# Patient Record
Sex: Female | Born: 1950 | Hispanic: Yes | Marital: Married | Smoking: Never smoker
Health system: Southern US, Community
[De-identification: ages and names within clinical notes are randomized; demographics above are authoritative.]

## PROBLEM LIST (undated history)

## (undated) DIAGNOSIS — D124 Benign neoplasm of descending colon: Secondary | ICD-10-CM

## (undated) DIAGNOSIS — R7303 Prediabetes: Secondary | ICD-10-CM

## (undated) DIAGNOSIS — K219 Gastro-esophageal reflux disease without esophagitis: Secondary | ICD-10-CM

## (undated) DIAGNOSIS — I10 Essential (primary) hypertension: Secondary | ICD-10-CM

## (undated) DIAGNOSIS — J302 Other seasonal allergic rhinitis: Secondary | ICD-10-CM

## (undated) DIAGNOSIS — L409 Psoriasis, unspecified: Secondary | ICD-10-CM

## (undated) DIAGNOSIS — E119 Type 2 diabetes mellitus without complications: Secondary | ICD-10-CM

## (undated) DIAGNOSIS — E785 Hyperlipidemia, unspecified: Secondary | ICD-10-CM

## (undated) DIAGNOSIS — H269 Unspecified cataract: Secondary | ICD-10-CM

## (undated) DIAGNOSIS — R195 Other fecal abnormalities: Secondary | ICD-10-CM

## (undated) DIAGNOSIS — Z8619 Personal history of other infectious and parasitic diseases: Secondary | ICD-10-CM

## (undated) DIAGNOSIS — Z8679 Personal history of other diseases of the circulatory system: Secondary | ICD-10-CM

## (undated) HISTORY — DX: Hyperlipidemia, unspecified: E78.5

## (undated) HISTORY — DX: Other seasonal allergic rhinitis: J30.2

## (undated) HISTORY — DX: Personal history of other infectious and parasitic diseases: Z86.19

## (undated) HISTORY — DX: Benign neoplasm of descending colon: D12.4

## (undated) HISTORY — DX: Essential (primary) hypertension: I10

## (undated) HISTORY — DX: Unspecified cataract: H26.9

## (undated) HISTORY — DX: Type 2 diabetes mellitus without complications: E11.9

## (undated) HISTORY — DX: Other fecal abnormalities: R19.5

## (undated) HISTORY — PX: CORNEA LACERATION REPAIR: SHX355

## (undated) HISTORY — PX: VALVE REPLACEMENT: SUR13

---

## 1979-08-05 DIAGNOSIS — Z5189 Encounter for other specified aftercare: Secondary | ICD-10-CM | POA: Insufficient documentation

## 1979-08-05 HISTORY — DX: Encounter for other specified aftercare: Z51.89

## 2015-01-08 DIAGNOSIS — Z952 Presence of prosthetic heart valve: Secondary | ICD-10-CM

## 2015-01-08 DIAGNOSIS — L409 Psoriasis, unspecified: Secondary | ICD-10-CM | POA: Insufficient documentation

## 2015-01-08 HISTORY — DX: Presence of prosthetic heart valve: Z95.2

## 2016-08-04 HISTORY — PX: KNEE ARTHROSCOPY W/ MENISCAL REPAIR: SHX1877

## 2017-10-28 ENCOUNTER — Encounter: Payer: Self-pay | Admitting: Gastroenterology

## 2017-11-25 HISTORY — PX: BREAST BIOPSY: SHX20

## 2017-12-04 ENCOUNTER — Ambulatory Visit: Payer: Self-pay | Admitting: Gastroenterology

## 2018-01-05 ENCOUNTER — Ambulatory Visit (INDEPENDENT_AMBULATORY_CARE_PROVIDER_SITE_OTHER): Payer: Medicaid Other | Admitting: Gastroenterology

## 2018-01-05 ENCOUNTER — Encounter: Payer: Self-pay | Admitting: Gastroenterology

## 2018-01-05 VITALS — BP 126/72 | HR 76 | Ht 59.0 in | Wt 141.5 lb

## 2018-01-05 DIAGNOSIS — R1013 Epigastric pain: Secondary | ICD-10-CM

## 2018-01-05 DIAGNOSIS — K219 Gastro-esophageal reflux disease without esophagitis: Secondary | ICD-10-CM

## 2018-01-05 DIAGNOSIS — Z800 Family history of malignant neoplasm of digestive organs: Secondary | ICD-10-CM

## 2018-01-05 MED ORDER — OMEPRAZOLE 20 MG PO CPDR: 20.0000 mg | DELAYED_RELEASE_CAPSULE | Freq: Every day | ORAL | 11 refills | Status: DC

## 2018-01-05 NOTE — Progress Notes (Signed)
Chief Complaint: For colorectal cancer screening and reflux  Referring Provider: Dr. Sheral Apley      ASSESSMENT AND PLAN;   #1. GERD with epigastric pain. Omeprazole 20mg  po qd #30. Zantac 150mg  po qd. Call in 2 weeks Pt's daughter to check PT in 2 weeks since omeprazole can affect Coumadin level. If still with problems in 2 weeks, proceed with ultrasound of the abdomen.  Patient and patient's daughter will call us.  #2.  FH colon cancer  (dad at age 5)- but pt on coumadin for mech MVR.  Discussed 3 options (1.  Watchful waiting 2.  Cologuard  3.  Colonoscopy -discussed risks and benefits for each.  She has opted for Cologuard) -cologuard test -If positive, then she would need colonoscopy.  Patient and patient's family do understand.  We would need cardiology's assistance regarding Coumadin/Lovenox bridging/heparin bridging if colonoscopy is needed.   HPI:    Charmine Bockrath is a 67 y.o. female  With occasional heartburn and intermittent epigastric discomfort made worse with spicy food.   Has more epigastric discomfort rather than pain, gnawing with occasional burning, nonradiating, no nocturnal symptoms. No nausea or vomiting No lower abdominal pain. No nausea, vomiting,  regurgitation, odynophagia or dysphagia.  No significant diarrhea or constipation.  There is no melena or hematochezia. No unintentional weight loss. 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  She was evaluated in [place] and was told to hold off on colonoscopy as she could not be taken off Coumadin.    Past Medical History:  Diagnosis Date  . Hyperlipidemia   . Hypertension   . Type II diabetes mellitus ([place])     Past Surgical History:  Procedure Laterality Date  . VALVE REPLACEMENT      Family History  Problem Relation Age of Onset  . Colon cancer Father     Social History   Tobacco Use  . Smoking status: Never Smoker  . Smokeless tobacco: Never Used    Substance Use Topics  . Alcohol use: Not Currently  . Drug use: Never    Current Outpatient Medications  Medication Sig Dispense Refill  . atorvastatin (LIPITOR) 10 MG tablet Take 10 mg by mouth daily.    . captopril (CAPOTEN) 25 MG tablet Take 25 mg by mouth 2 (two) times daily.    . citalopram (CELEXA) 10 MG tablet Take 10 mg by mouth daily.    Marland Kitchen loratadine (CLARITIN) 10 MG tablet Take 10 mg by mouth daily.    . ranitidine (ZANTAC) 150 MG tablet Take 150 mg by mouth 3 times/day as needed-between meals & bedtime for heartburn.    . spironolactone (ALDACTONE) 25 MG tablet Take 25 mg by mouth daily.    . traMADol (ULTRAM) 50 MG tablet Take by mouth 3 (three) times daily as needed.    . warfarin (COUMADIN) 3 MG tablet Take 3 mg by mouth every other day.    . warfarin (COUMADIN) 4 MG tablet Take 4 mg by mouth every other day.     No current facility-administered medications for this visit.     Not on File  Review of Systems:  Constitutional: Denies fever, chills, diaphoresis, appetite change and fatigue.  HEENT: Denies photophobia, eye pain, redness, hearing loss, ear pain, congestion, sore throat, rhinorrhea, sneezing, mouth sores, neck pain, neck stiffness and tinnitus.   Respiratory: Denies SOB, DOE, cough, chest tightness,  and wheezing.   Cardiovascular: Denies chest pain, palpitations and leg swelling.  Genitourinary: Denies dysuria, urgency, frequency, hematuria, flank pain and difficulty urinating.  Musculoskeletal: Denies myalgias, back pain, joint swelling, arthralgias and gait problem.  Skin: No rash.  Neurological: Denies dizziness, seizures, syncope, weakness, light-headedness, numbness and headaches.  Hematological: Denies adenopathy. Easy bruising, personal or family bleeding history  Psychiatric/Behavioral: No anxiety or depression     Physical Exam:    BP 126/72   Pulse 76   Ht 4\' 11"  (1.499 m)   Wt 141 lb 8 oz (64.2 kg)   BMI 28.58 kg/m  Filed Weights    01/05/18 1137  Weight: 141 lb 8 oz (64.2 kg)   Constitutional:  Well-developed, in no acute distress. Psychiatric: Normal mood and affect. Behavior is normal. HEENT: Pupils normal.  Conjunctivae are normal. No scleral icterus. Neck supple.  Cardiovascular: Normal rate, regular rhythm. No edema Pulmonary/chest: Effort normal and breath sounds normal. No wheezing, rales or rhonchi. Abdominal: Soft, nondistended. Nontender. Bowel sounds active throughout. There are no masses palpable. No hepatomegaly. Rectal:  defered Neurological: Alert and oriented to person place and time. Skin: Skin is warm and dry. No rashes noted.   Carmell Austria, MD 01/05/2018, 11:54 AM  Cc: Dr. Sheral Apley

## 2018-01-05 NOTE — Patient Instructions (Signed)
If you are age 69 or older, your body mass index should be between 23-30. Your Body mass index is 28.58 kg/m. If this is out of the aforementioned range listed, please consider follow up with your Primary Care Provider.  If you are age 57 or younger, your body mass index should be between 19-25. Your Body mass index is 28.58 kg/m. If this is out of the aformentioned range listed, please consider follow up with your Primary Care Provider.   We have sent the following medications to your pharmacy for you to pick up at your convenience: Omeprazole 20 mg once daily.  Please purchase the following medications over the counter and take as directed: Zantac 150 mg take one tablet by mouth at bedtime.    Your provider has ordered Cologuard testing as an option for colon cancer screening. This is performed by [employer] [facility] and may be out of network with your insurance. PRIOR to completing the test, it is YOUR responsibility to contact your insurance about covered benefits for this test. Your out of pocket expense could be anywhere from $0.00 to $649.00.   When you call to check coverage with your insurer, please provide the following information:   -The ONLY provider of Cologuard is [place] code for Cologuard is [phone].  [employer] NPI # [id number]  -[employer] Tax ID # [id number]   We have already sent your demographic and insurance information to [facility] (phone number [phone]) and they should contact you within the next week regarding your test. If you have not heard from them within the next week, please call our office at [phone].     Thank you,  Dr. Jackquline Denmark

## 2018-02-08 ENCOUNTER — Encounter: Payer: Self-pay | Admitting: Cardiology

## 2018-02-08 ENCOUNTER — Encounter: Payer: Self-pay | Admitting: *Deleted

## 2018-02-08 ENCOUNTER — Ambulatory Visit: Payer: Medicaid Other | Admitting: Cardiology

## 2018-02-08 DIAGNOSIS — Z01818 Encounter for other preprocedural examination: Secondary | ICD-10-CM | POA: Diagnosis not present

## 2018-02-08 DIAGNOSIS — E785 Hyperlipidemia, unspecified: Secondary | ICD-10-CM | POA: Diagnosis not present

## 2018-02-08 DIAGNOSIS — R7303 Prediabetes: Secondary | ICD-10-CM | POA: Insufficient documentation

## 2018-02-08 DIAGNOSIS — I10 Essential (primary) hypertension: Secondary | ICD-10-CM | POA: Diagnosis not present

## 2018-02-08 DIAGNOSIS — Z952 Presence of prosthetic heart valve: Secondary | ICD-10-CM | POA: Diagnosis not present

## 2018-02-08 HISTORY — DX: Prediabetes: R73.03

## 2018-02-08 HISTORY — DX: Encounter for other preprocedural examination: Z01.818

## 2018-02-08 HISTORY — DX: Presence of prosthetic heart valve: Z95.2

## 2018-02-08 HISTORY — DX: Essential (primary) hypertension: I10

## 2018-02-08 HISTORY — DX: Hyperlipidemia, unspecified: E78.5

## 2018-02-08 NOTE — Progress Notes (Signed)
Cardiology Consultation:    Date:  02/08/2018   ID:  Maria Ingram, DOB 24-Jun-1951, MRN [id number]  PCP:  Maryella Shivers, MD  Cardiologist:  Jenne Campus, MD   Referring MD: Maryella Shivers, MD   Chief Complaint  Patient presents with  . Pre-op Exam  I need a right knee replacement surgery  History of Present Illness:    Maria Ingram is a 67 y.o. female who is being seen today for the evaluation of cardiac issue before knee replacement surgery at the request of Maryella Shivers, MD.  Many years ago she had mitral valve replaced.  It was done in [place] however a few days later she required another surgery for the same problem.  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  I seen her last time in 2016.  She is been doing well and overall seems to be doing well but now started having a lot of pain in her right knee for last 2 years and required knee replacement surgery she is here in my office to be evaluated before the surgery.  She denies having any problems from heart point review there is no chest pain tightness squeezing pressure burning chest.  No swelling of lower extremities no shortness of breath but her ability to exercise is limited because of chronic right knee problem.  While at the time of her open heart surgery did not require bypass surgery as the valve replacement and according to the record to the latest valve that she had [employer] prosthesis.  I do not know the size of the valve  Past Medical History:  Diagnosis Date  . Hyperlipidemia   . Hypertension   . Type II diabetes mellitus ([place])     Past Surgical History:  Procedure Laterality Date  . VALVE REPLACEMENT      Current Medications: Current Meds  Medication Sig  . atorvastatin (LIPITOR) 10 MG tablet Take 10 mg by mouth daily.  . captopril (CAPOTEN) 25 MG tablet Take 25 mg by mouth 2 (two) times daily.  . citalopram (CELEXA) 10 MG tablet Take  10 mg by mouth daily.  Marland Kitchen loratadine (CLARITIN) 10 MG tablet Take 10 mg by mouth daily.  Marland Kitchen omeprazole (PRILOSEC) 20 MG capsule Take 1 capsule (20 mg total) by mouth daily.  . ranitidine (ZANTAC) 150 MG tablet Take 150 mg by mouth 3 times/day as needed-between meals & bedtime for heartburn.  . spironolactone (ALDACTONE) 25 MG tablet Take 25 mg by mouth daily.  . traMADol (ULTRAM) 50 MG tablet Take by mouth 3 (three) times daily as needed.  . warfarin (COUMADIN) 3 MG tablet Take 3 mg by mouth every other day.     Allergies:   Patient has no known allergies.   Social History   Socioeconomic History  . Marital status: Married    Spouse name: Not on file  . Number of children: Not on file  . Years of education: Not on file  . Highest education level: Not on file  Occupational History  . Not on file  Social Needs  . Financial resource strain: Not on file  . Food insecurity:    Worry: Not on file    Inability: Not on file  . Transportation needs:    Medical: Not on file    Non-medical: Not on file  Tobacco Use  . Smoking status: Never Smoker  . Smokeless tobacco: Never Used  Substance and Sexual Activity  . Alcohol use: Not  Currently  . Drug use: Never  . Sexual activity: Not on file  Lifestyle  . Physical activity:    Days per week: Not on file    Minutes per session: Not on file  . Stress: Not on file  Relationships  . Social connections:    Talks on phone: Not on file    Gets together: Not on file    Attends religious service: Not on file    Active member of club or organization: Not on file    Attends meetings of clubs or organizations: Not on file    Relationship status: Not on file  Other Topics Concern  . Not on file  Social History Narrative  . Not on file     Family History: The patient's family history includes Colon cancer in her father. ROS:   Please see the history of present illness.    All 14 point review of systems negative except as described per  history of present illness.  EKGs/Labs/Other Studies Reviewed:    The following studies were reviewed today:   EKG:  EKG is  ordered today.  The ekg ordered today demonstrates normal sinus rhythm normal PR interval normal QS complex duration morphology with nonspecific ST-T segment changes  Recent Labs: No results found for requested labs within last 8760 hours.  Recent Lipid Panel No results found for: CHOL, TRIG, HDL, CHOLHDL, VLDL, LDLCALC, LDLDIRECT  Physical Exam:    VS:  BP 134/68   Pulse 90   Ht 4\' 11"  (1.499 m)   Wt 142 lb (64.4 kg)   SpO2 96%   BMI 28.68 kg/m     Wt Readings from Last 3 Encounters:  02/08/18 142 lb (64.4 kg)  01/05/18 141 lb 8 oz (64.2 kg)     GEN:  Well nourished, well developed in no acute distress HEENT: Normal NECK: No JVD; No carotid bruits LYMPHATICS: No lymphadenopathy CARDIAC: RRR, no murmurs, no rubs, no gallops  RESPIRATORY:  Clear to auscultation without rales, wheezing or rhonchi  ABDOMEN: Soft, non-tender, non-distended MUSCULOSKELETAL:  No edema; No deformity  SKIN: Warm and dry NEUROLOGIC:  Alert and oriented x 3 PSYCHIATRIC:  Normal affect   ASSESSMENT:    1. History of mitral valve prosthesis   2. Essential hypertension   3. Dyslipidemia   4. Borderline diabetes    PLAN:    In order of problems listed above:  1. History of mitral valve prosthesis apparently to [facility] prosthesis.  I will ask you to have echocardiogram to check on the valve. 2. Essential hypertension blood pressure well controlled we will continue present management. 3. Dyslipidemia she is on statin and I will continue with that we will call primary care physician to get her fasting lipid profile. 4. Borderline diabetes: Followed by internal medicine team.  Hopefully after knee will be fixed to be able to exercise better which helps with the management of this problem. 5. Preop evaluation for right knee replacement surgery.  Valve will be assessed by  doing echocardiogram however because of multiple risk factors for coronary artery disease she also required evaluation for potential problem she will be scheduled to have [place]. 6. She does take Coumadin.  Coumadin need to be withdrawn 5 days before surgery and then if her INR drops to below 2 will start Lovenox.   Medication Adjustments/Labs and Tests Ordered: Current medicines are reviewed at length with the patient today.  Concerns regarding medicines are outlined above.  No orders of the  defined types were placed in this encounter.  No orders of the defined types were placed in this encounter.   Signed, Park Liter, MD, [facility] 02/08/2018 11:47 AM    [place]

## 2018-02-08 NOTE — Patient Instructions (Addendum)
Medication Instructions:  Your physician recommends that you continue on your current medications as directed. Please refer to the Current Medication list given to you today.  **You will be notified about test results and further instructions regarding coumadin.   Labwork: Your physician recommends that you return for lab work 5 days before scheduled procedure: PT/INR. You can go to our [place] office to have this done. The address is [address] We are opened Monday-Friday 8-5.   Testing/Procedures: You had an EKG today.   Your physician has requested that you have an echocardiogram. Echocardiography is a painless test that uses sound waves to create images of your heart. It provides your doctor with information about the size and shape of your heart and how well your heart's chambers and valves are working. This procedure takes approximately one hour. There are no restrictions for this procedure.  Your physician has requested that you have a lexiscan myoview. For further information please visit [email or web address]. Please follow instruction sheet, as given.  Follow-Up: Your physician recommends that you schedule a follow-up appointment in: 2 months.  If you need a refill on your cardiac medications before your next appointment, please call your pharmacy.   Thank you for choosing [facility]! Robyne Peers, RN [phone]   Echocardiogram An echocardiogram, or echocardiography, uses sound waves (ultrasound) to produce an image of your heart. The echocardiogram is simple, painless, obtained within a short period of time, and offers valuable information to your health care provider. The images from an echocardiogram can provide information such as:  Evidence of coronary artery disease (CAD).  Heart size.  Heart muscle function.  Heart valve function.  Aneurysm detection.  Evidence of a past heart attack.  Fluid buildup around the heart.  Heart muscle  thickening.  Assess heart valve function.  Tell a health care provider about:  Any allergies you have.  All medicines you are taking, including vitamins, herbs, eye drops, creams, and over-the-counter medicines.  Any problems you or family members have had with anesthetic medicines.  Any blood disorders you have.  Any surgeries you have had.  Any medical conditions you have.  Whether you are pregnant or may be pregnant. What happens before the procedure? No special preparation is needed. Eat and drink normally. What happens during the procedure?  In order to produce an image of your heart, gel will be applied to your chest and a wand-like tool (transducer) will be moved over your chest. The gel will help transmit the sound waves from the transducer. The sound waves will harmlessly bounce off your heart to allow the heart images to be captured in real-time motion. These images will then be recorded.  You may need an IV to receive a medicine that improves the quality of the pictures. What happens after the procedure? You may return to your normal schedule including diet, activities, and medicines, unless your health care provider tells you otherwise. This information is not intended to replace advice given to you by your health care provider. Make sure you discuss any questions you have with your health care provider. Document Released: 07/18/2000 Document Revised: 03/08/2016 Document Reviewed: 03/28/2013 Elsevier Interactive Patient Education  2017 [employer].    Cardiac Nuclear Scan A cardiac nuclear scan is a test that measures blood flow to the heart when a person is resting and when he or she is exercising. The test looks for problems such as:  Not enough blood reaching a portion of the heart.  The  heart muscle not working normally.  You may need this test if:  You have heart disease.  You have had abnormal lab results.  You have had heart surgery or  angioplasty.  You have chest pain.  You have shortness of breath.  In this test, a radioactive dye (tracer) is injected into your bloodstream. After the tracer has traveled to your heart, an imaging device is used to measure how much of the tracer is absorbed by or distributed to various areas of your heart. This procedure is usually done at a hospital and takes 2-4 hours. Tell a health care provider about:  Any allergies you have.  All medicines you are taking, including vitamins, herbs, eye drops, creams, and over-the-counter medicines.  Any problems you or family members have had with the use of anesthetic medicines.  Any blood disorders you have.  Any surgeries you have had.  Any medical conditions you have.  Whether you are pregnant or may be pregnant. What are the risks? Generally, this is a safe procedure. However, problems may occur, including:  Serious chest pain and heart attack. This is only a risk if the stress portion of the test is done.  Rapid heartbeat.  Sensation of warmth in your chest. This usually passes quickly.  What happens before the procedure?  Ask your health care provider about changing or stopping your regular medicines. This is especially important if you are taking diabetes medicines or blood thinners.  Remove your jewelry on the day of the procedure. What happens during the procedure?  An IV tube will be inserted into one of your veins.  Your health care provider will inject a small amount of radioactive tracer through the tube.  You will wait for 20-40 minutes while the tracer travels through your bloodstream.  Your heart activity will be monitored with an electrocardiogram (ECG).  You will lie down on an exam table.  Images of your heart will be taken for about 15-20 minutes.  You may be asked to exercise on a treadmill or stationary bike. While you exercise, your heart's activity will be monitored with an ECG, and your blood pressure  will be checked. If you are unable to exercise, you may be given a medicine to increase blood flow to parts of your heart.  When blood flow to your heart has peaked, a tracer will again be injected through the IV tube.  After 20-40 minutes, you will get back on the exam table and have more images taken of your heart.  When the procedure is over, your IV tube will be removed. The procedure may vary among health care providers and hospitals. Depending on the type of tracer used, scans may need to be repeated 3-4 hours later. What happens after the procedure?  Unless your health care provider tells you otherwise, you may return to your normal schedule, including diet, activities, and medicines.  Unless your health care provider tells you otherwise, you may increase your fluid intake. This will help flush the contrast dye from your body. Drink enough fluid to keep your urine clear or pale yellow.  It is up to you to get your test results. Ask your health care provider, or the department that is doing the test, when your results will be ready. Summary  A cardiac nuclear scan measures the blood flow to the heart when a person is resting and when he or she is exercising.  You may need this test if you are at risk for heart  disease.  Tell your health care provider if you are pregnant.  Unless your health care provider tells you otherwise, increase your fluid intake. This will help flush the contrast dye from your body. Drink enough fluid to keep your urine clear or pale yellow. This information is not intended to replace advice given to you by your health care provider. Make sure you discuss any questions you have with your health care provider. Document Released: 08/15/2004 Document Revised: 07/23/2016 Document Reviewed: 06/29/2013 Elsevier Interactive Patient Education  2017 [employer].

## 2018-02-09 ENCOUNTER — Encounter (HOSPITAL_COMMUNITY): Payer: Self-pay

## 2018-02-09 NOTE — Patient Instructions (Signed)
Your procedure is scheduled on: Tuesday, February 16, 2018   Surgery Time:  2:40PM-3:50PM   Report to [facility] Main  Entrance    Report to admitting at 10:10 AM   Call this number if you have problems the morning of surgery [phone]   Do not eat food:After Midnight.   Do NOT smoke after Midnight   May have liquids until 8:30 AM morning of surgery      CLEAR LIQUID DIET   Foods Allowed                                                                     Foods Excluded  Coffee and tea, regular and decaf                             liquids that you cannot  Plain Jell-O in any flavor                                             see through such as: Fruit ices (not with fruit pulp)                                     milk, soups, orange juice  Iced Popsicles                                    All solid food Carbonated beverages, regular and diet                                    Cranberry, grape and apple juices Sports drinks like Gatorade Lightly seasoned clear broth or consume(fat free) Sugar, honey syrup  Sample Menu Breakfast                                Lunch                                     Supper Cranberry juice                    Beef broth                            Chicken broth Jell-O                                     Grape juice                           Apple juice Coffee or tea  Jell-O                                      Popsicle                                                Coffee or tea                        Coffee or tea   Take these medicines the morning of surgery with A SIP OF WATER: Atorvastatin, Citalopram, Loratadine, Ranitidine                               You may not have any metal on your body including hair pins, jewelry, and body piercings             Do not wear make-up, lotions, powders, perfumes/cologne, or deodorant             Do not wear nail polish.  Do not shave  48 hours prior to surgery.               Do not bring valuables to the hospital. [place].   Contacts, dentures or bridgework may not be worn into surgery.   Leave suitcase in the car. After surgery it may be brought to your room.   Special Instructions: Bring a copy of your healthcare power of attorney and living will documents         the day of surgery if you haven't scanned them in before.              Please read over the following fact sheets you were given:  [facility] - Preparing for Surgery Before surgery, you can play an important role.  Because skin is not sterile, your skin needs to be as free of germs as possible.  You can reduce the number of germs on your skin by washing with CHG (chlorahexidine gluconate) soap before surgery.  CHG is an antiseptic cleaner which kills germs and bonds with the skin to continue killing germs even after washing. Please DO NOT use if you have an allergy to CHG or antibacterial soaps.  If your skin becomes reddened/irritated stop using the CHG and inform your nurse when you arrive at Short Stay. Do not shave (including legs and underarms) for at least 48 hours prior to the first CHG shower.  You may shave your face/neck.  Please follow these instructions carefully:  1.  Shower with CHG Soap the night before surgery and the  morning of surgery.  2.  If you choose to wash your hair, wash your hair first as usual with your normal  shampoo.  3.  After you shampoo, rinse your hair and body thoroughly to remove the shampoo.                             4.  Use CHG as you would any other liquid soap.  You can apply chg directly to the skin and wash.  Gently with  a scrungie or clean washcloth.  5.  Apply the CHG Soap to your body ONLY FROM THE NECK DOWN.   Do   not use on face/ open                           Wound or open sores. Avoid contact with eyes, ears mouth and   genitals (private parts).                       Wash face,   Genitals (private parts) with your normal soap.             6.  Wash thoroughly, paying special attention to the area where your    surgery  will be performed.  7.  Thoroughly rinse your body with warm water from the neck down.  8.  DO NOT shower/wash with your normal soap after using and rinsing off the CHG Soap.                9.  Pat yourself dry with a clean towel.            10.  Wear clean pajamas.            11.  Place clean sheets on your bed the night of your first shower and do not  sleep with pets. Day of Surgery : Do not apply any lotions/deodorants the morning of surgery.  Please wear clean clothes to the hospital/surgery center.  FAILURE TO FOLLOW THESE INSTRUCTIONS MAY RESULT IN THE CANCELLATION OF YOUR SURGERY  PATIENT SIGNATURE_________________________________  NURSE SIGNATURE__________________________________  ________________________________________________________________________   Maria Ingram  An incentive spirometer is a tool that can help keep your lungs clear and active. This tool measures how well you are filling your lungs with each breath. Taking long deep breaths may help reverse or decrease the chance of developing breathing (pulmonary) problems (especially infection) following:  A long period of time when you are unable to move or be active. BEFORE THE PROCEDURE   If the spirometer includes an indicator to show your best effort, your nurse or respiratory therapist will set it to a desired goal.  If possible, sit up straight or lean slightly forward. Try not to slouch.  Hold the incentive spirometer in an upright position. INSTRUCTIONS FOR USE  1. Sit on the edge of your bed if possible, or sit up as far as you can in bed or on a chair. 2. Hold the incentive spirometer in an upright position. 3. Breathe out normally. 4. Place the mouthpiece in your mouth and seal your lips tightly around it. 5. Breathe in slowly and as deeply as possible,  raising the piston or the ball toward the top of the column. 6. Hold your breath for 3-5 seconds or for as long as possible. Allow the piston or ball to fall to the bottom of the column. 7. Remove the mouthpiece from your mouth and breathe out normally. 8. Rest for a few seconds and repeat Steps 1 through 7 at least 10 times every 1-2 hours when you are awake. Take your time and take a few normal breaths between deep breaths. 9. The spirometer may include an indicator to show your best effort. Use the indicator as a goal to work toward during each repetition. 10. After each set of 10 deep breaths, practice coughing to be sure your lungs are clear. If you have an incision (the cut  made at the time of surgery), support your incision when coughing by placing a pillow or rolled up towels firmly against it. Once you are able to get out of bed, walk around indoors and cough well. You may stop using the incentive spirometer when instructed by your caregiver.  RISKS AND COMPLICATIONS  Take your time so you do not get dizzy or light-headed.  If you are in pain, you may need to take or ask for pain medication before doing incentive spirometry. It is harder to take a deep breath if you are having pain. AFTER USE  Rest and breathe slowly and easily.  It can be helpful to keep track of a log of your progress. Your caregiver can provide you with a simple table to help with this. If you are using the spirometer at home, follow these instructions: [place] IF:   You are having difficultly using the spirometer.  You have trouble using the spirometer as often as instructed.  Your pain medication is not giving enough relief while using the spirometer.  You develop fever of 100.5 F (38.1 C) or higher. SEEK IMMEDIATE MEDICAL CARE IF:   You cough up bloody sputum that had not been present before.  You develop fever of 102 F (38.9 C) or greater.  You develop worsening pain at or near the  incision site. MAKE SURE YOU:   Understand these instructions.  Will watch your condition.  Will get help right away if you are not doing well or get worse. Document Released: 12/01/2006 Document Revised: 10/13/2011 Document Reviewed: 02/01/2007 Exit[employer] Patient Information 2014 ExitCare, [place].   ________________________________________________________________________  WHAT IS A BLOOD TRANSFUSION? Blood Transfusion Information  A transfusion is the replacement of blood or some of its parts. Blood is made up of multiple cells which provide different functions.  Red blood cells carry oxygen and are used for blood loss replacement.  White blood cells fight against infection.  Platelets control bleeding.  Plasma helps clot blood.  Other blood products are available for specialized needs, such as hemophilia or other clotting disorders. BEFORE THE TRANSFUSION  Who gives blood for transfusions?   Healthy volunteers who are fully evaluated to make sure their blood is safe. This is blood bank blood. Transfusion therapy is the safest it has ever been in the practice of medicine. Before blood is taken from a donor, a complete history is taken to make sure that person has no history of diseases nor engages in risky social behavior (examples are intravenous drug use or sexual activity with multiple partners). The donor's travel history is screened to minimize risk of transmitting infections, such as malaria. The donated blood is tested for signs of infectious diseases, such as HIV and hepatitis. The blood is then tested to be sure it is compatible with you in order to minimize the chance of a transfusion reaction. If you or a relative donates blood, this is often done in anticipation of surgery and is not appropriate for emergency situations. It takes many days to process the donated blood. RISKS AND COMPLICATIONS Although transfusion therapy is very safe and saves many lives, the main dangers  of transfusion include:   Getting an infectious disease.  Developing a transfusion reaction. This is an allergic reaction to something in the blood you were given. Every precaution is taken to prevent this. The decision to have a blood transfusion has been considered carefully by your caregiver before blood is given. Blood is not given unless the benefits  outweigh the risks. AFTER THE TRANSFUSION  Right after receiving a blood transfusion, you will usually feel much better and more energetic. This is especially true if your red blood cells have gotten low (anemic). The transfusion raises the level of the red blood cells which carry oxygen, and this usually causes an energy increase.  The nurse administering the transfusion will monitor you carefully for complications. HOME CARE INSTRUCTIONS  No special instructions are needed after a transfusion. You may find your energy is better. Speak with your caregiver about any limitations on activity for underlying diseases you may have. SEEK MEDICAL CARE IF:   Your condition is not improving after your transfusion.  You develop redness or irritation at the intravenous (IV) site. SEEK IMMEDIATE MEDICAL CARE IF:  Any of the following symptoms occur over the next 12 hours:  Shaking chills.  You have a temperature by mouth above 102 F (38.9 C), not controlled by medicine.  Chest, back, or muscle pain.  People around you feel you are not acting correctly or are confused.  Shortness of breath or difficulty breathing.  Dizziness and fainting.  You get a rash or develop hives.  You have a decrease in urine output.  Your urine turns a dark color or changes to pink, red, or brown. Any of the following symptoms occur over the next 10 days:  You have a temperature by mouth above 102 F (38.9 C), not controlled by medicine.  Shortness of breath.  Weakness after normal activity.  The white part of the eye turns yellow (jaundice).  You  have a decrease in the amount of urine or are urinating less often.  Your urine turns a dark color or changes to pink, red, or brown. Document Released: 07/18/2000 Document Revised: 10/13/2011 Document Reviewed: 03/06/2008 [facility] Patient Information 2014 [place], [place].  _______________________________________________________________________

## 2018-02-09 NOTE — Pre-Procedure Instructions (Signed)
The following are in epic: Last office visit note Dr. Jeryl Columbia 02/08/2018 EKG 02/08/2018

## 2018-02-10 ENCOUNTER — Inpatient Hospital Stay (HOSPITAL_COMMUNITY)
Admission: RE | Admit: 2018-02-10 | Discharge: 2018-02-10 | Disposition: A | Discharge status: Home / Self Care | Payer: Medicaid Other | Source: Ambulatory Visit

## 2018-02-10 DIAGNOSIS — Z952 Presence of prosthetic heart valve: Secondary | ICD-10-CM | POA: Diagnosis not present

## 2018-02-10 HISTORY — DX: Psoriasis, unspecified: L40.9

## 2018-02-10 HISTORY — DX: Personal history of other diseases of the circulatory system: Z86.79

## 2018-02-10 HISTORY — DX: Prediabetes: R73.03

## 2018-02-10 HISTORY — DX: Gastro-esophageal reflux disease without esophagitis: K21.9

## 2018-02-11 ENCOUNTER — Ambulatory Visit (INDEPENDENT_AMBULATORY_CARE_PROVIDER_SITE_OTHER): Payer: Medicaid Other | Admitting: Cardiology

## 2018-02-11 ENCOUNTER — Telehealth: Payer: Self-pay | Admitting: Cardiology

## 2018-02-11 ENCOUNTER — Encounter: Payer: Self-pay | Admitting: Cardiology

## 2018-02-11 VITALS — BP 132/76 | HR 88 | Ht 59.0 in | Wt 140.0 lb

## 2018-02-11 DIAGNOSIS — E785 Hyperlipidemia, unspecified: Secondary | ICD-10-CM

## 2018-02-11 DIAGNOSIS — Z952 Presence of prosthetic heart valve: Secondary | ICD-10-CM

## 2018-02-11 DIAGNOSIS — R9439 Abnormal result of other cardiovascular function study: Secondary | ICD-10-CM

## 2018-02-11 DIAGNOSIS — R7303 Prediabetes: Secondary | ICD-10-CM | POA: Diagnosis not present

## 2018-02-11 DIAGNOSIS — I10 Essential (primary) hypertension: Secondary | ICD-10-CM | POA: Diagnosis not present

## 2018-02-11 HISTORY — DX: Abnormal result of other cardiovascular function study: R94.39

## 2018-02-11 LAB — POCT INR: INR: 2.5 (ref 2.0–3.0)

## 2018-02-11 MED ORDER — ENOXAPARIN SODIUM 60 MG/0.6ML ~~LOC~~ SOLN: 60.0000 mg | Freq: Two times a day (BID) | SUBCUTANEOUS | 1 refills | Status: DC

## 2018-02-11 NOTE — Progress Notes (Signed)
Cardiology Office Note:    Date:  02/11/2018   ID:  Maria Ingram, DOB 1950-08-15, MRN [id number]  PCP:  Maryella Shivers, MD  Cardiologist:  Jenne Campus, MD    Referring MD: Maryella Shivers, MD   No chief complaint on file. I am here to talk about abnormal stress test  History of Present Illness:    Maria Ingram is a 66 y.o. female with mitral valve replacement secondary to rheumatic heart disease that was done in [place] many years ago.  At that time she had a cardiac catheterization apparently no significant lesions in the coronary arteries were identified.  She came to me because she required clearance for a right knee replacement surgery she did not have typical symptoms but her ability to exercise obviously was limited because of a right knee problem.  We elected to proceed with stress testing trying to stratify her risk factors before surgery stress test came abnormal indicating possibility of coronary artery disease with ischemia involving mid and apical portion of the anterior wall.  She now tells me that she does have some pain that happened when she worked hard in her patio on the right side of her chest.  It last for a few minutes typically relieved by rest.  She does have multiple risk factors for coronary artery disease which include hypertension dyslipidemia as well as borderline diabetes.  We debated about what to do with the situation I told her that the best option is to proceed with cardiac catheterization or medical therapy.  She wants to go with cardiac catheterization.  Procedure was explained to her including all risk benefits as well as alternatives.  She agreed to proceed.  Obviously complicating factor is her Coumadin needs.  We will stop Coumadin few days before cardiac catheterization we will bridge her with Lovenox and then will proceed with cardiac catheterization.  Obviously surgery is on hold until this  situation will be clarified  Past  Medical History:  Diagnosis Date  . GERD (gastroesophageal reflux disease)   . History of rheumatic heart disease   . Hyperlipidemia   . Hypertension   . Pre-diabetes    Borderline  . Psoriasis     Past Surgical History:  Procedure Laterality Date  . VALVE REPLACEMENT     St Jude mitral valve prosthesis    Current Medications: Current Meds  Medication Sig  . atorvastatin (LIPITOR) 10 MG tablet Take 10 mg by mouth daily.  . captopril (CAPOTEN) 25 MG tablet Take 25 mg by mouth 2 (two) times daily.  . citalopram (CELEXA) 10 MG tablet Take 10 mg by mouth daily.  Marland Kitchen loratadine (CLARITIN) 10 MG tablet Take 10 mg by mouth daily.  Marland Kitchen omeprazole (PRILOSEC) 20 MG capsule Take 1 capsule (20 mg total) by mouth daily.  . ranitidine (ZANTAC) 150 MG tablet Take 150 mg by mouth daily.   Marland Kitchen spironolactone (ALDACTONE) 25 MG tablet Take 25 mg by mouth daily.  . traMADol (ULTRAM) 50 MG tablet Take 50 mg by mouth daily.   Marland Kitchen warfarin (COUMADIN) 3 MG tablet Take 3 mg by mouth See admin instructions. Alternates taking 3 mg one day then 4 mg the next  . warfarin (COUMADIN) 4 MG tablet Take 4 mg by mouth See admin instructions. Alternates taking 3 mg one day then 4 mg the next     Allergies:   Iodine   Social History   Socioeconomic History  . Marital status: Married    Spouse name: Not on  file  . Number of children: Not on file  . Years of education: Not on file  . Highest education level: Not on file  Occupational History  . Not on file  Social Needs  . Financial resource strain: Not on file  . Food insecurity:    Worry: Not on file    Inability: Not on file  . Transportation needs:    Medical: Not on file    Non-medical: Not on file  Tobacco Use  . Smoking status: Never Smoker  . Smokeless tobacco: Never Used  Substance and Sexual Activity  . Alcohol use: Not Currently  . Drug use: Never  . Sexual activity: Not on file  Lifestyle  . Physical activity:    Days per week: Not on file      Minutes per session: Not on file  . Stress: Not on file  Relationships  . Social connections:    Talks on phone: Not on file    Gets together: Not on file    Attends religious service: Not on file    Active member of club or organization: Not on file    Attends meetings of clubs or organizations: Not on file    Relationship status: Not on file  Other Topics Concern  . Not on file  Social History Narrative  . Not on file     Family History: The patient's family history includes Colon cancer in her father. ROS:   Please see the history of present illness.    All 14 point review of systems negative except as described per history of present illness  EKGs/Labs/Other Studies Reviewed:      Recent Labs: No results found for requested labs within last 8760 hours.  Recent Lipid Panel No results found for: CHOL, TRIG, HDL, CHOLHDL, VLDL, LDLCALC, LDLDIRECT  Physical Exam:    VS:  BP 132/76 (BP Location: Right Arm, Patient Position: Sitting, Cuff Size: Normal)   Pulse 88   Ht 4\' 11"  (1.499 m)   Wt 140 lb (63.5 kg)   SpO2 96%   BMI 28.28 kg/m     Wt Readings from Last 3 Encounters:  02/11/18 140 lb (63.5 kg)  02/08/18 142 lb (64.4 kg)  01/05/18 141 lb 8 oz (64.2 kg)     GEN:  Well nourished, well developed in no acute distress HEENT: Normal NECK: No JVD; No carotid bruits LYMPHATICS: No lymphadenopathy CARDIAC: RRR, Chris mechanical valve sounds, no rubs, no gallops RESPIRATORY:  Clear to auscultation without rales, wheezing or rhonchi  ABDOMEN: Soft, non-tender, non-distended MUSCULOSKELETAL:  No edema; No deformity  SKIN: Warm and dry LOWER EXTREMITIES: no swelling NEUROLOGIC:  Alert and oriented x 3 PSYCHIATRIC:  Normal affect   ASSESSMENT:    1. History of mitral valve prosthesis   2. Abnormal stress test   3. Essential hypertension   4. Borderline diabetes   5. Dyslipidemia   6. H/O mitral valve replacement with mechanical valve    PLAN:    In  order of problems listed above:  1. 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  The size of the valve is unknown.  She seems to be doing well from that point review awaiting echocardiogram. 2. Abnormal stress test indicating ischemia involving mid and apical portion of the anterior wall cardiac catheterization will be scheduled 3. Essential hypertension blood pressure well controlled continue present management. 4. Dyslipidemia we will continue with present medications. 5.  Medication Adjustments/Labs and Tests Ordered: Current medicines are reviewed at length with the patient today.  Concerns regarding medicines are outlined above.  No orders of the defined types were placed in this encounter.  Medication changes: No orders of the defined types were placed in this encounter.   Signed, Park Liter, MD, [facility] 02/11/2018 3:03 PM    [place]

## 2018-02-11 NOTE — H&P (View-Only) (Signed)
Cardiology Office Note:    Date:  02/11/2018   ID:  Maria Ingram, DOB 17-Dec-1950, MRN [id number]  PCP:  Maryella Shivers, MD  Cardiologist:  Jenne Campus, MD    Referring MD: Maryella Shivers, MD   No chief complaint on file. I am here to talk about abnormal stress test  History of Present Illness:    Maria Ingram is a 67 y.o. female with mitral valve replacement secondary to rheumatic heart disease that was done in [place] many years ago.  At that time she had a cardiac catheterization apparently no significant lesions in the coronary arteries were identified.  She came to me because she required clearance for a right knee replacement surgery she did not have typical symptoms but her ability to exercise obviously was limited because of a right knee problem.  We elected to proceed with stress testing trying to stratify her risk factors before surgery stress test came abnormal indicating possibility of coronary artery disease with ischemia involving mid and apical portion of the anterior wall.  She now tells me that she does have some pain that happened when she worked hard in her patio on the right side of her chest.  It last for a few minutes typically relieved by rest.  She does have multiple risk factors for coronary artery disease which include hypertension dyslipidemia as well as borderline diabetes.  We debated about what to do with the situation I told her that the best option is to proceed with cardiac catheterization or medical therapy.  She wants to go with cardiac catheterization.  Procedure was explained to her including all risk benefits as well as alternatives.  She agreed to proceed.  Obviously complicating factor is her Coumadin needs.  We will stop Coumadin few days before cardiac catheterization we will bridge her with Lovenox and then will proceed with cardiac catheterization.  Obviously surgery is on hold until this  situation will be clarified  Past  Medical History:  Diagnosis Date  . GERD (gastroesophageal reflux disease)   . History of rheumatic heart disease   . Hyperlipidemia   . Hypertension   . Pre-diabetes    Borderline  . Psoriasis     Past Surgical History:  Procedure Laterality Date  . VALVE REPLACEMENT     St Jude mitral valve prosthesis    Current Medications: Current Meds  Medication Sig  . atorvastatin (LIPITOR) 10 MG tablet Take 10 mg by mouth daily.  . captopril (CAPOTEN) 25 MG tablet Take 25 mg by mouth 2 (two) times daily.  . citalopram (CELEXA) 10 MG tablet Take 10 mg by mouth daily.  Marland Kitchen loratadine (CLARITIN) 10 MG tablet Take 10 mg by mouth daily.  Marland Kitchen omeprazole (PRILOSEC) 20 MG capsule Take 1 capsule (20 mg total) by mouth daily.  . ranitidine (ZANTAC) 150 MG tablet Take 150 mg by mouth daily.   Marland Kitchen spironolactone (ALDACTONE) 25 MG tablet Take 25 mg by mouth daily.  . traMADol (ULTRAM) 50 MG tablet Take 50 mg by mouth daily.   Marland Kitchen warfarin (COUMADIN) 3 MG tablet Take 3 mg by mouth See admin instructions. Alternates taking 3 mg one day then 4 mg the next  . warfarin (COUMADIN) 4 MG tablet Take 4 mg by mouth See admin instructions. Alternates taking 3 mg one day then 4 mg the next     Allergies:   Iodine   Social History   Socioeconomic History  . Marital status: Married    Spouse name: Not on  file  . Number of children: Not on file  . Years of education: Not on file  . Highest education level: Not on file  Occupational History  . Not on file  Social Needs  . Financial resource strain: Not on file  . Food insecurity:    Worry: Not on file    Inability: Not on file  . Transportation needs:    Medical: Not on file    Non-medical: Not on file  Tobacco Use  . Smoking status: Never Smoker  . Smokeless tobacco: Never Used  Substance and Sexual Activity  . Alcohol use: Not Currently  . Drug use: Never  . Sexual activity: Not on file  Lifestyle  . Physical activity:    Days per week: Not on file      Minutes per session: Not on file  . Stress: Not on file  Relationships  . Social connections:    Talks on phone: Not on file    Gets together: Not on file    Attends religious service: Not on file    Active member of club or organization: Not on file    Attends meetings of clubs or organizations: Not on file    Relationship status: Not on file  Other Topics Concern  . Not on file  Social History Narrative  . Not on file     Family History: The patient's family history includes Colon cancer in her father. ROS:   Please see the history of present illness.    All 14 point review of systems negative except as described per history of present illness  EKGs/Labs/Other Studies Reviewed:      Recent Labs: No results found for requested labs within last 8760 hours.  Recent Lipid Panel No results found for: CHOL, TRIG, HDL, CHOLHDL, VLDL, LDLCALC, LDLDIRECT  Physical Exam:    VS:  BP 132/76 (BP Location: Right Arm, Patient Position: Sitting, Cuff Size: Normal)   Pulse 88   Ht 4\' 11"  (1.499 m)   Wt 140 lb (63.5 kg)   SpO2 96%   BMI 28.28 kg/m     Wt Readings from Last 3 Encounters:  02/11/18 140 lb (63.5 kg)  02/08/18 142 lb (64.4 kg)  01/05/18 141 lb 8 oz (64.2 kg)     GEN:  Well nourished, well developed in no acute distress HEENT: Normal NECK: No JVD; No carotid bruits LYMPHATICS: No lymphadenopathy CARDIAC: RRR, Chris mechanical valve sounds, no rubs, no gallops RESPIRATORY:  Clear to auscultation without rales, wheezing or rhonchi  ABDOMEN: Soft, non-tender, non-distended MUSCULOSKELETAL:  No edema; No deformity  SKIN: Warm and dry LOWER EXTREMITIES: no swelling NEUROLOGIC:  Alert and oriented x 3 PSYCHIATRIC:  Normal affect   ASSESSMENT:    1. History of mitral valve prosthesis   2. Abnormal stress test   3. Essential hypertension   4. Borderline diabetes   5. Dyslipidemia   6. H/O mitral valve replacement with mechanical valve    PLAN:    In  order of problems listed above:  1. 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  The size of the valve is unknown.  She seems to be doing well from that point review awaiting echocardiogram. 2. Abnormal stress test indicating ischemia involving mid and apical portion of the anterior wall cardiac catheterization will be scheduled 3. Essential hypertension blood pressure well controlled continue present management. 4. Dyslipidemia we will continue with present medications. 5.  Medication Adjustments/Labs and Tests Ordered: Current medicines are reviewed at length with the patient today.  Concerns regarding medicines are outlined above.  No orders of the defined types were placed in this encounter.  Medication changes: No orders of the defined types were placed in this encounter.   Signed, Park Liter, MD, [facility] 02/11/2018 3:03 PM    [place]

## 2018-02-11 NOTE — Telephone Encounter (Signed)
Patient will be seen in the office today

## 2018-02-11 NOTE — Telephone Encounter (Signed)
Patient s daughter is calling for results of testing done at [facility] yesterday for pre-op. Patient needs to have Louvenox injection for surgery on Tuesday and needs results asap!

## 2018-02-11 NOTE — Progress Notes (Signed)
Nurse Larene Beach with Dr. Nyra Capes office was called and informed of patient's visit today. She was informed that the patient would not be having surgery as she will be undergoing heart cath the 16th. Informed that coumadin was stopped today and lovenox 60 mg q 12 will be started tomorrow. Lovenox will be held the afternoon before the procedure and restarted on the 17th in the am. Next INR check should be on the 22nd and lovenox will be taken away once INR is 2.5 or greater. Coumadin directions were per Fuller Canada, PharmD. Staff message was sent to Dr. Alvan Dame that procedure has been cancelled.

## 2018-02-11 NOTE — Patient Instructions (Addendum)
Medication Instructions:  Your physician has recommended you make the following change in your medication:  Comienzo 81 mg de aspirin cada dia  Comienzo lovenox manana 60 mg cada 12 horas  Deja de coumadin hoy  Laboritorios:  CBC and BMP  Testing/Procedures:     [place] [address] Dept: [phone] Loc: [phone]  Jaella Weinert  02/11/2018  You are scheduled for a Cardiac Catheterization on  [facility], July 16 with Dr. Peter Martinique.  1. Please arrive at the [facility] (Main Entrance A) at [facility]: [address] [place], [place] [address] at 8:00 AM (two hours before your procedure to ensure your preparation). Free valet parking service is available.   Special note: Every effort is made to have your procedure done on time. Please understand that emergencies sometimes delay scheduled procedures.  2. Diet: dieta liquida clara hasta la cinco de la manana el dia de la cateterizacin  3. Labs: hoy  4. Medication instructions in preparation for your procedure:  No tomes tu spironolactone el dia de la cateterizacin  El 10 de [place] solo tomar to [employer]  On the morning of your procedure, take your Aspirin and any morning medicines NOT listed above.  You may use sips of water.  5. Plan for one night stay--bring personal belongings. 6. Bring a current list of your medications and current insurance cards. 7. You MUST have a responsible person to drive you home. 8. Someone MUST be with you the first 24 hours after you arrive home or your discharge will be delayed. 9. Please wear clothes that are easy to get on and off and wear slip-on shoes.  Thank you for allowing [place] to care for you!   -- [facility]   Follow-Up: Your physician recommends that you schedule a follow-up appointment in: 4 semanas Any  Other Special Instructions Will Be Listed Below (If Applicable).     If you need a refill on your cardiac medications before your next appointment, please call your pharmacy.   [place], RN, BSN  Enoxaparin injection Qu es este medicamento? La ENOXAPARINA se utiliza despus de una operacin de rodilla, cadera o abdomen para evitar la coagulacin. Tambin se [place] para tratar cogulos sanguneos que existen en los pulmones o en las venas. Este medicamento puede ser utilizado para otros usos; si tiene alguna pregunta consulte con su proveedor de atencin mdica o con su farmacutico. MARCAS COMUNES: Lovenox Qu le debo informar a mi profesional de la salud antes de tomar este medicamento? Necesita saber si usted presenta alguno de los [employer] o situaciones: -trastornos de Teacher, music, hemorragia o hemofilia -infeccin del corazn o de las vlvulas cardacas -enfermedad renal o heptica -derrame cerebral previo -prtesis de vlvula cardiaca -ciruga o parto reciente -lcera estomacal o intestinal, diverticulitis u otras enfermedades intestinales -una reaccin alrgica o inusual a la enoxaparina, heparina, carne de cerdo o productos porcinos, otros medicamentos, alimentos, colorantes o conservantes -si est embarazada o buscando quedar embarazada -si est amamantando a un beb Cmo debo utilizar este medicamento? Este medicamento se administra mediante inyeccin por va subcutnea. Generalmente lo [place] un profesional de KB Home	[place]. Usted o un miembro de la familia puede capacitarse para Research officer, political party las inyecciones. Si va a administrarse inyecciones usted mismo, asegrese de comprender cmo se utiliza la jeringa, cmo se mide la dosis en caso de ser  necesario, cmo se administra la inyeccin. No friccione el lugar de la inyeccin de este medicamento de esta forma Visual merchandiser. No use su medicamento con una frecuencia mayor a la indicada. No  deje de usarlo excepto si as lo indica su mdico o su profesional de KB Home	[place]. Asegrese de recibir un recipiente resistente a los pinchazos para Comptroller las agujas y las jeringas una vez que las [place]. No vuelva a usar estos elementos. Devuelva el recipiente a su mdico o a su profesional de la salud para que lo deseche de Barista. Hable con su pediatra para informarse acerca del uso de este medicamento en nios. Puede requerir atencin especial. Sobredosis: Pngase en contacto inmediatamente con un centro toxicolgico o una sala de urgencia si usted cree que haya tomado demasiado medicamento. ATENCIN: [employer] es solo para usted. No comparta este medicamento con nadie. Qu sucede si me olvido de una dosis? Si olvida una dosis, sela lo antes posible. Si es casi la hora de la prxima dosis, use slo esa dosis. No use dosis adicionales o dobles. Qu puede interactuar con este medicamento? -aspirina y medicamentos tipo aspirina -ciertos medicamentos que tratan o previenen cogulos sanguneos -dipiridamol -los AINE, medicamentos para Conservation officer, historic buildings y inflamacin, como ibuprofeno o naproxeno Puede ser que esta lista no menciona todas las posibles interacciones. Informe a su profesional de KB Home	[place] de [employer] Corporation productos a base de hierbas, medicamentos de [place] o suplementos nutritivos que est tomando. Si usted fuma, consume bebidas alcohlicas o si utiliza drogas ilegales, indqueselo tambin a su profesional de KB Home	[place]. Algunas sustancias pueden interactuar con su medicamento. A qu debo estar atento al usar Coca-Cola? Visite a su mdico o a su profesional de la salud para chequear su evolucin peridicamente. Se supervisar su estado de salud atentamente mientras reciba este medicamento. Notifique a su mdico o profesional de la salud y consigue tratamiento de emergencia si desarrolla problemas respiratorios; cambios en la visin; dolor en el pecho; dolor de  cabeza grave, repentino; dolor, hinchazn, o calidez en la pierna, dificultar para hablar, entumecimiento o debilidad repentina de la cara, brazo o pierna. Estos pueden ser signos que su afeccin haya empeorado. Si va a someterse a una operacin, informe a su mdico o a su profesional de la salud que recibe Coca[employer]Cola. No deje de usar este medicamento sin antes consultar con su mdico. Asegurarse de rellenar su receta antes de quedarse sin medicamento. Evite los deportes y CIT Group puedan provocar lesiones mientras est tomando este medicamento. Las cadas o lesiones severas pueden provocar sangrados que no pueden verse. Proceda con cuidado al utilizar herramientas o cuchillos afilados. Considere la posibilidad de Risk manager una Diplomatic Services operational officer. Tome precauciones especiales al [employer] dientes o al limpiarlos con hilo dental. Informe a su mdico o a su profesional de la salud si observa lesiones, magulladuras o manchas rojas en la piel. Qu efectos secundarios puedo tener al Masco Corporation este medicamento? Efectos secundarios que debe informar a su mdico o a Barrister's clerk de la salud tan pronto como sea posible: -Chief of Staff como erupcin cutnea, picazn o urticarias, hinchazn de la cara, labios o lengua -sensacin de desmayos o aturdimiento, cadas -signos y sntomas de sangrado tales como heces de color oscuro, con sangre o con aspecto alquitranado; orina roja o marrn oscura; escupir sangre o material marrn que parece granos de caf; puntos rojos en la piel; sangrado o magulladuras inusuales de los ojos, encas o nariz Efectos secundarios que, por  lo general, no requieren atencin mdica (debe informarlos a su mdico o a su profesional de la salud si persisten o si son molestos): -dolor, enrojecimiento o Actor de la inyeccin Puede ser que esta lista no menciona todos los posibles efectos secundarios. Comunquese a su mdico por asesoramiento mdico  Humana Inc. Usted puede informar los efectos secundarios a la FDA por telfono al 1-800-FDA-1088. Dnde debo guardar mi medicina? Mantngala fuera del alcance de los nios. [place] a FPL Group, entre 15 y 60 grados C (65 y 47 grados F). No la congele. Si las inyecciones fueron preparadas especialmente, tal vez sea necesario guardarlas en el refrigerador. Consulte a Midwife. Deseche todo el medicamento que no haya utilizado, despus de la fecha de vencimiento. ATENCIN: Este folleto es un resumen. Puede ser que no cubra toda la posible informacin. Si usted tiene preguntas acerca de esta medicina, consulte con su mdico, su farmacutico o su profesional de Technical sales engineer.  2018 Elsevier/Gold Standard (2014-09-12 00:00:00)  Suzan Garibaldi coronaria Coronary Angiogram Ardelia Mems angiografa coronaria es un procedimiento con radiografas que se utiliza para examinar las arterias del corazn. En este procedimiento, se inyecta una especie de tinte (sustancia de [place]) a travs de un tubo largo y delgado (catter). El catter se inserta a travs de la ingle, de la [place] o del brazo. La sustancia de contraste se inyecta en cada arteria, y se toman radiografas para ver si hay una obstruccin en las arterias del corazn. Este procedimiento tambin puede mostrar si tiene alguna enfermedad que afecte las vlvulas o la aorta, y puede usarse para comprobar la funcin general del msculo cardaco. Pueden hacerle una angiografa coronaria si ocurre algo de lo siguiente:  Tiene dolor de pecho u otros sntomas de angina, y est en riesgo de sufrir alguna enfermedad cardaca.  Un electrocardiograma (ECG) o Nutritional therapist.  Tiene dolor de pecho e insuficiencia cardaca.  Presenta un ritmo cardaco irregular.  Usted y el mdico determinan que los beneficios de la informacin obtenida con la prueba son Lex[employer] International riesgos del procedimiento.  Informe a su  mdico acerca de lo siguiente:  Cualquier alergia que tenga, incluidas alergias a sustancias de [place].  Todos los Lyondell Chemical, incluidos vitaminas, hierbas, gotas oftlmicas, cremas y medicamentos de venta libre.  Cualquier problema previo que usted o los miembros de su familia hayan tenido con anestsicos.  Enfermedades de la sangre que tenga.  Cirugas previas.  Antecedentes de problemas renales o insuficiencia renal.  Cualquier enfermedad que tenga.  Si est embarazada o podra estarlo. Cules son los riesgos? En general, se trata de un procedimiento seguro. Sin embargo, pueden ocurrir complicaciones, por ejemplo:  Una infeccin.  Reaccin alrgica a los medicamentos o a las sustancias de [place].  Hemorragia en el lugar de acceso u otras zonas.  Lesin renal, especialmente en las personas con trastornos en la funcin renal.  Accidente cerebrovascular (poco frecuente).  infarto de miocardio (poco frecuente).  Daos a Catering manager u otros rganos.  Qu ocurre antes del procedimiento? Mantenerse hidratado Siga las indicaciones del mdico acerca de la hidratacin, que pueden incluir:  Hasta 2horas antes del procedimiento, puede beber lquidos transparentes, como agua, jugos frutales transparentes, caf negro y t solo.  Restricciones en las comidas y bebidas [place] comidas y las bebidas, las cuales pueden incluir lo siguiente:  Ocho horas antes del procedimiento, deje de ingerir comidas o alimentos pesados, por  ejemplo, carne, alimentos fritos o alimentos grasos.  Seis horas antes del procedimiento, deje de ingerir comidas o alimentos livianos, como tostadas o cereales.  Dos horas antes del procedimiento, deje de beber lquidos transparentes.  Instrucciones generales  Consulte al mdico si debe hacer o no lo siguiente: ? Cambiar o suspender los medicamentos que toma habitualmente. Esto es muy  importante si toma medicamentos para la diabetes o anticoagulantes. ? Tomar medicamentos, como ibuprofeno. Estos medicamentos pueden tener un efecto anticoagulante en la [place]. No tome estos medicamentos antes del procedimiento si el mdico le indica que no lo haga. Sin embargo, es posible que le recomienden tomar aspirina antes de realizarse una angiografa coronaria.  Haga planes para que una persona lo lleve a casa cuando salga de la clnica o del hospital.  Letitia Caul vez necesite hacerse anlisis de sangre o radiografas. Qu ocurre durante el procedimiento?  Le colocarn un tubo (catter) intravenoso en una de las venas.  Le administrarn uno o ms de los siguientes medicamentos: ? Un medicamento para ayudarlo a relajarse (sedante). ? Un medicamento para adormecer la zona en la que se insertar el catter (anestesia local).  Para reducir el riesgo de infecciones: ? El equipo mdico se lavar o se desinfectar las manos. ? Le lavarn la piel con jabn. ? Pueden rasurarle la zona donde se insertar el catter.  Lo conectarn a un monitor continuo de ECG.  El catter se insertar en una arteria. El Environmental consultant de insercin puede ser en la ingle, en la [place] o en el pliegue del brazo (cerca del codo).  Se utilizar un tipo de radiografa (fluoroscopia) para guiar el catter [facility] abertura del vaso sanguneo que debe examinarse.  Se inyectar una sustancia de contraste por el catter, y se tomarn radiografas. La sustancia de contraste mostrar si hay estrechamientos u obstrucciones en las arterias cardacas.  Infrmele al mdico si tiene dolor de pecho o dificultad para respirar durante el procedimiento.  Si se encuentran obstrucciones, el mdico puede realizar otro procedimiento, por ejemplo, insertar un stent coronario. Este procedimiento puede variar segn el mdico y el hospital. Sander Nephew ocurre despus del procedimiento?  Despus del procedimiento, deber mantener la zona quieta durante  algunas horas o el tiempo que le indique el mdico. Si el procedimiento se realiza a travs de la ingle, le indicarn que no flexione ni cruce las piernas.  El lugar de la insercin ser controlado con frecuencia.  Le controlarn con frecuencia el pulso en el pie o en la [place].  Es posible que le hagan ms anlisis de [place], [place] y Ardelia Mems prueba que registra la actividad elctrica del corazn (ECG).  No conduzca durante 24horas si le administraron un sedante. Resumen  Una angiografa coronaria es un procedimiento con radiografas que se [place] para observar las arterias del corazn.  Durante el procedimiento, se inyecta una especie de tinte (sustancia de [place]) a travs de un tubo largo y delgado (catter). El catter se inserta a travs de la ingle, de la [place] o del brazo.  Infrmele al mdico sobre cualquier alergia que tenga, incluidas alergias a sustancias de [place].  Despus del procedimiento, deber mantener la zona quieta durante algunas horas o el tiempo que le indique el mdico. Esta informacin no tiene Marine scientist el consejo del mdico. Asegrese de hacerle al mdico cualquier pregunta que tenga. Document Released: 04/30/2005 Document Revised: 10/22/2016 Document Reviewed: 02/24/2016 Elsevier Interactive Patient Education  [employer].

## 2018-02-11 NOTE — Telephone Encounter (Signed)
Results have been pulled will have Krasowski review.

## 2018-02-12 ENCOUNTER — Inpatient Hospital Stay (HOSPITAL_COMMUNITY): Admission: RE | Admit: 2018-02-12 | Payer: Medicaid Other | Source: Ambulatory Visit

## 2018-02-12 ENCOUNTER — Other Ambulatory Visit: Payer: Self-pay

## 2018-02-12 DIAGNOSIS — Z952 Presence of prosthetic heart valve: Secondary | ICD-10-CM

## 2018-02-12 DIAGNOSIS — Z01818 Encounter for other preprocedural examination: Secondary | ICD-10-CM

## 2018-02-12 LAB — CBC WITH DIFFERENTIAL/PLATELET
BASOS: 1 %
Basophils Absolute: 0.1 10*3/uL (ref 0.0–0.2)
EOS (ABSOLUTE): 1.6 10*3/uL — ABNORMAL HIGH (ref 0.0–0.4)
EOS: 20 %
HEMATOCRIT: 37.6 % (ref 34.0–46.6)
HEMOGLOBIN: 12.7 g/dL (ref 11.1–15.9)
IMMATURE GRANULOCYTES: 0 %
Immature Grans (Abs): 0 10*3/uL (ref 0.0–0.1)
LYMPHS ABS: 2 10*3/uL (ref 0.7–3.1)
Lymphs: 26 %
MCH: 30.5 pg (ref 26.6–33.0)
MCHC: 33.8 g/dL (ref 31.5–35.7)
MCV: 90 fL (ref 79–97)
MONOS ABS: 0.6 10*3/uL (ref 0.1–0.9)
Monocytes: 8 %
Neutrophils Absolute: 3.6 10*3/uL (ref 1.4–7.0)
Neutrophils: 45 %
Platelets: 316 10*3/uL (ref 150–450)
RBC: 4.16 x10E6/uL (ref 3.77–5.28)
RDW: 13.5 % (ref 12.3–15.4)
WBC: 7.8 10*3/uL (ref 3.4–10.8)

## 2018-02-12 LAB — BASIC METABOLIC PANEL
BUN/Creatinine Ratio: 19 (ref 12–28)
BUN: 13 mg/dL (ref 8–27)
CALCIUM: 9.8 mg/dL (ref 8.7–10.3)
CO2: 27 mmol/L (ref 20–29)
CREATININE: 0.67 mg/dL (ref 0.57–1.00)
Chloride: 99 mmol/L (ref 96–106)
GFR, EST AFRICAN AMERICAN: 104 mL/min/{1.73_m2} (ref >59)
GFR, EST NON AFRICAN AMERICAN: 90 mL/min/{1.73_m2} (ref >59)
Glucose: 89 mg/dL (ref 65–99)
Potassium: 5 mmol/L (ref 3.5–5.2)
Sodium: 141 mmol/L (ref 134–144)

## 2018-02-12 MED ORDER — PREDNISONE 50 MG PO TABS: ORAL_TABLET | ORAL | 0 refills | Status: DC

## 2018-02-12 NOTE — Telephone Encounter (Signed)
Informed family member on DPR regarding medication management for cath on Tuesday. Educated regarding prednisone and benadryl needed for allergy prep. Order has been sent for medication.

## 2018-02-14 NOTE — H&P (Signed)
TOTAL KNEE ADMISSION H&P  Patient is being admitted for right total knee arthroplasty.  Subjective:  Chief Complaint:   Right knee primary OA / pain  HPI: Maria Ingram, 67 y.o. female, has a history of pain and functional disability in the right knee due to arthritis and has failed non-surgical conservative treatments for greater than 12 weeks to include NSAID's and/or analgesics, corticosteriod injections and activity modification.  Onset of symptoms was gradual, starting 2 years ago with gradually worsening course since that time. The patient noted no past surgery on the right knee(s).  Patient currently rates pain in the right knee(s) at 10 out of 10 with activity. Patient has night pain, worsening of pain with activity and weight bearing, pain that interferes with activities of daily living, pain with passive range of motion, crepitus and joint swelling.  Patient has evidence of periarticular osteophytes and joint space narrowing by imaging studies.  There is no active infection.  Risks, benefits and expectations were discussed with the patient.  Risks including but not limited to the risk of anesthesia, blood clots, nerve damage, blood vessel damage, failure of the prosthesis, infection and up to and including death.  Patient understand the risks, benefits and expectations and wishes to proceed with surgery.   PCP: Maryella Shivers, MD  D/C Plans:       Home   Post-op Meds:       No Rx given   Tranexamic Acid:      To be given - IV   Decadron:      Is to be given  FYI:     Coumadin  Norco  DME:   Rx given for - RW   PT:   OPPT Rx given   Patient Active Problem List   Diagnosis Date Noted  . Abnormal stress test 02/11/2018  . History of mitral valve prosthesis 02/08/2018  . Essential hypertension 02/08/2018  . Dyslipidemia 02/08/2018  . Borderline diabetes 02/08/2018  . Pre-operative clearance 02/08/2018  . H/O mitral valve replacement with mechanical valve  01/08/2015  . Psoriasis 01/08/2015   Past Medical History:  Diagnosis Date  . GERD (gastroesophageal reflux disease)   . History of rheumatic heart disease   . Hyperlipidemia   . Hypertension   . Pre-diabetes    Borderline  . Psoriasis     Past Surgical History:  Procedure Laterality Date  . VALVE REPLACEMENT     St Jude mitral valve prosthesis    No current facility-administered medications for this encounter.    Current Outpatient Medications  Medication Sig Dispense Refill Last Dose  . atorvastatin (LIPITOR) 10 MG tablet Take 10 mg by mouth daily.   Taking  . captopril (CAPOTEN) 25 MG tablet Take 25 mg by mouth 2 (two) times daily.   Taking  . citalopram (CELEXA) 10 MG tablet Take 10 mg by mouth daily.   Taking  . loratadine (CLARITIN) 10 MG tablet Take 10 mg by mouth daily.   Taking  . ranitidine (ZANTAC) 150 MG tablet Take 150 mg by mouth daily.    Taking  . spironolactone (ALDACTONE) 25 MG tablet Take 25 mg by mouth daily.   Taking  . traMADol (ULTRAM) 50 MG tablet Take 50 mg by mouth daily.    Taking  . warfarin (COUMADIN) 3 MG tablet Take 3 mg by mouth See admin instructions. Alternates taking 3 mg one day then 4 mg the next   Taking  . warfarin (COUMADIN) 4 MG tablet Take 4 mg  by mouth See admin instructions. Alternates taking 3 mg one day then 4 mg the next   Taking  . enoxaparin (LOVENOX) 60 MG/0.6ML injection Inject 0.6 mLs (60 mg total) into the skin every 12 (twelve) hours. 20 Syringe 1   . omeprazole (PRILOSEC) 20 MG capsule Take 1 capsule (20 mg total) by mouth daily. 30 capsule 11 Taking  . predniSONE (DELTASONE) 50 MG tablet Take 1 tablet 13 hours before cath, another 1 tablet 7 hours before cath, final dose is to be taken 1 hour before cath- 1 tablet 3 tablet 0    Allergies  Allergen Reactions  . Iodine     Eye swelling, reaction happened previously but has used since without reaction.  Unsure if this is still an allergy     Social History   Tobacco Use   . Smoking status: Never Smoker  . Smokeless tobacco: Never Used  Substance Use Topics  . Alcohol use: Not Currently    Family History  Problem Relation Age of Onset  . Colon cancer Father      Review of Systems  Constitutional: Negative.   HENT: Negative.   Eyes: Negative.   Respiratory: Negative.   Cardiovascular: Negative.   Gastrointestinal: Negative.   Genitourinary: Negative.   Musculoskeletal: Positive for joint pain.  Skin: Negative.   Neurological: Negative.   Endo/Heme/Allergies: Negative.   Psychiatric/Behavioral: Negative.     Objective:  Physical Exam  Constitutional: She is oriented to person, place, and time. She appears well-developed.  HENT:  Head: Normocephalic.  Eyes: Pupils are equal, round, and reactive to light.  Neck: Neck supple. No JVD present. No tracheal deviation present. No thyromegaly present.  Cardiovascular: Normal rate, regular rhythm and intact distal pulses.  Murmur heard. Respiratory: Effort normal and breath sounds normal. No respiratory distress. She has no wheezes.  GI: Soft. There is no tenderness. There is no guarding.  Lymphadenopathy:    She has no cervical adenopathy.  Neurological: She is alert and oriented to person, place, and time.  Skin: Skin is warm and dry.  Psychiatric: She has a normal mood and affect.     Labs:  Estimated body mass index is 28.28 kg/m as calculated from the following:   Height as of 02/11/18: 4\' 11"  (1.499 m).   Weight as of 02/11/18: 63.5 kg (140 lb).   Imaging Review Plain radiographs demonstrate severe degenerative joint disease of the right knee(s).  The bone quality appears to be good for age and reported activity level.   Preoperative templating of the joint replacement has been completed, documented, and submitted to the Operating Room personnel in order to optimize intra-operative equipment management.   Anticipated LOS equal to or greater than 2 midnights due to - Age 44 and  older with one or more of the following:  - Obesity  - Expected need for hospital services (PT, OT, Nursing) required for safe  discharge  - Anticipated need for postoperative skilled nursing care or inpatient rehab  - Active co-morbidities: Diabetes and Coronary Artery Disease    Assessment/Plan:  End stage arthritis, right knee   The patient history, physical examination, clinical judgment of the provider and imaging studies are consistent with end stage degenerative joint disease of the right knee(s) and total knee arthroplasty is deemed medically necessary. The treatment options including medical management, injection therapy arthroscopy and arthroplasty were discussed at length. The risks and benefits of total knee arthroplasty were presented and reviewed. The risks due to aseptic loosening,  infection, stiffness, patella tracking problems, thromboembolic complications and other imponderables were discussed. The patient acknowledged the explanation, agreed to proceed with the plan and consent was signed. Patient is being admitted for inpatient treatment for surgery, pain control, PT, OT, prophylactic antibiotics, VTE prophylaxis, progressive ambulation and ADL's and discharge planning. The patient is planning to be discharged home.     West Pugh Macklen Wilhoite   PA-C  02/14/2018, 10:12 AM

## 2018-02-15 ENCOUNTER — Telehealth: Payer: Self-pay | Admitting: *Deleted

## 2018-02-15 NOTE — Telephone Encounter (Signed)
Pt contacted pre-catheterization scheduled at [facility] for: Tuesday July 16,2019 10:30 AM Verified arrival time and place: [place] Entrance A at: 8 AM  No solid food after midnight prior to cath, clear liquids until 5 AM day of procedure. Verified allergies in Epic.   Hold: Spironolactone AM of procedure Coumadin -on hold starting 02/12/18 until post procedure. Lovenox-last dose AM 02/15/18 until post procedure.  Except hold medications AM meds can be taken pre-cath with sip of water including: ASA 81 mg Prednisone 50 mg Benadryl 50 mg  Confirmed patient has responsible person to drive home post procedure and for 24 hours after you arrive home: yes  I discussed instructions for 13 hour Prednisone and Benadryl Prep with pt's granddaughter, [name] (DPR), she verbalized understanding, thanked me for call.

## 2018-02-16 ENCOUNTER — Encounter (HOSPITAL_COMMUNITY)
Admission: RE | Disposition: A | Discharge status: Home / Self Care | Payer: Self-pay | Source: Ambulatory Visit | Attending: Cardiology

## 2018-02-16 ENCOUNTER — Ambulatory Visit (HOSPITAL_COMMUNITY)
Admission: RE | Admit: 2018-02-16 | Discharge: 2018-02-16 | Disposition: A | Discharge status: Home / Self Care | Payer: Medicaid Other | Source: Ambulatory Visit | Attending: Cardiology | Admitting: Cardiology

## 2018-02-16 ENCOUNTER — Other Ambulatory Visit: Payer: Self-pay

## 2018-02-16 DIAGNOSIS — R9439 Abnormal result of other cardiovascular function study: Secondary | ICD-10-CM

## 2018-02-16 DIAGNOSIS — Z952 Presence of prosthetic heart valve: Secondary | ICD-10-CM | POA: Diagnosis not present

## 2018-02-16 DIAGNOSIS — K219 Gastro-esophageal reflux disease without esophagitis: Secondary | ICD-10-CM | POA: Insufficient documentation

## 2018-02-16 DIAGNOSIS — R7303 Prediabetes: Secondary | ICD-10-CM | POA: Diagnosis not present

## 2018-02-16 DIAGNOSIS — Z883 Allergy status to other anti-infective agents status: Secondary | ICD-10-CM | POA: Diagnosis not present

## 2018-02-16 DIAGNOSIS — Z7901 Long term (current) use of anticoagulants: Secondary | ICD-10-CM | POA: Insufficient documentation

## 2018-02-16 DIAGNOSIS — I10 Essential (primary) hypertension: Secondary | ICD-10-CM | POA: Diagnosis not present

## 2018-02-16 DIAGNOSIS — L409 Psoriasis, unspecified: Secondary | ICD-10-CM | POA: Insufficient documentation

## 2018-02-16 DIAGNOSIS — E785 Hyperlipidemia, unspecified: Secondary | ICD-10-CM | POA: Diagnosis not present

## 2018-02-16 HISTORY — PX: LEFT HEART CATH AND CORONARY ANGIOGRAPHY: CATH118249

## 2018-02-16 LAB — PROTIME-INR
INR: 1.01
Prothrombin Time: 13.2 seconds (ref 11.4–15.2)

## 2018-02-16 SURGERY — ARTHROPLASTY, KNEE, TOTAL
Anesthesia: Spinal | Site: Knee | Laterality: Right

## 2018-02-16 SURGERY — LEFT HEART CATH AND CORONARY ANGIOGRAPHY
Anesthesia: LOCAL

## 2018-02-16 MED ORDER — SODIUM CHLORIDE 0.9 % WEIGHT BASED INFUSION
3.0000 mL/kg/h | INTRAVENOUS | Status: AC
  Administered 2018-02-16: 3 mL/kg/h via INTRAVENOUS

## 2018-02-16 MED ORDER — HEPARIN SODIUM (PORCINE) 1000 UNIT/ML IJ SOLN
INTRAMUSCULAR | Status: DC | PRN
  Administered 2018-02-16: 3500 [IU] via INTRAVENOUS

## 2018-02-16 MED ORDER — MIDAZOLAM HCL 2 MG/2ML IJ SOLN
INTRAMUSCULAR | Status: AC
  Filled 2018-02-16: qty 2

## 2018-02-16 MED ORDER — VERAPAMIL HCL 2.5 MG/ML IV SOLN
INTRAVENOUS | Status: AC
  Filled 2018-02-16: qty 2

## 2018-02-16 MED ORDER — SODIUM CHLORIDE 0.9 % IV SOLN: 250.0000 mL | INTRAVENOUS | Status: DC | PRN

## 2018-02-16 MED ORDER — SODIUM CHLORIDE 0.9% FLUSH: 3.0000 mL | INTRAVENOUS | Status: DC | PRN

## 2018-02-16 MED ORDER — LIDOCAINE HCL (PF) 1 % IJ SOLN
INTRAMUSCULAR | Status: AC
  Filled 2018-02-16: qty 30

## 2018-02-16 MED ORDER — ACETAMINOPHEN 325 MG PO TABS: 650.0000 mg | ORAL_TABLET | ORAL | Status: DC | PRN

## 2018-02-16 MED ORDER — LIDOCAINE HCL (PF) 1 % IJ SOLN
INTRAMUSCULAR | Status: DC | PRN
  Administered 2018-02-16: 2 mL

## 2018-02-16 MED ORDER — FENTANYL CITRATE (PF) 100 MCG/2ML IJ SOLN
INTRAMUSCULAR | Status: AC
  Filled 2018-02-16: qty 2

## 2018-02-16 MED ORDER — SODIUM CHLORIDE 0.9 % WEIGHT BASED INFUSION: 1.0000 mL/kg/h | INTRAVENOUS | Status: AC

## 2018-02-16 MED ORDER — HEPARIN (PORCINE) IN NACL 1000-0.9 UT/500ML-% IV SOLN
INTRAVENOUS | Status: AC
  Filled 2018-02-16: qty 1000

## 2018-02-16 MED ORDER — SODIUM CHLORIDE 0.9% FLUSH: 3.0000 mL | Freq: Two times a day (BID) | INTRAVENOUS | Status: DC

## 2018-02-16 MED ORDER — VERAPAMIL HCL 2.5 MG/ML IV SOLN
INTRAVENOUS | Status: DC | PRN
  Administered 2018-02-16: 10 mL via INTRA_ARTERIAL

## 2018-02-16 MED ORDER — HEPARIN SODIUM (PORCINE) 1000 UNIT/ML IJ SOLN
INTRAMUSCULAR | Status: AC
  Filled 2018-02-16: qty 1

## 2018-02-16 MED ORDER — HEPARIN (PORCINE) IN NACL 1000-0.9 UT/500ML-% IV SOLN
INTRAVENOUS | Status: DC | PRN
  Administered 2018-02-16 (×2): 500 mL

## 2018-02-16 MED ORDER — IOHEXOL 350 MG/ML SOLN
INTRAVENOUS | Status: DC | PRN
  Administered 2018-02-16: 55 mL via INTRACARDIAC

## 2018-02-16 MED ORDER — ASPIRIN 81 MG PO CHEW: 81.0000 mg | CHEWABLE_TABLET | ORAL | Status: DC

## 2018-02-16 MED ORDER — ONDANSETRON HCL 4 MG/2ML IJ SOLN: 4.0000 mg | Freq: Four times a day (QID) | INTRAMUSCULAR | Status: DC | PRN

## 2018-02-16 MED ORDER — MIDAZOLAM HCL 2 MG/2ML IJ SOLN
INTRAMUSCULAR | Status: DC | PRN
  Administered 2018-02-16: 1 mg via INTRAVENOUS

## 2018-02-16 MED ORDER — FENTANYL CITRATE (PF) 100 MCG/2ML IJ SOLN
INTRAMUSCULAR | Status: DC | PRN
  Administered 2018-02-16: 25 ug via INTRAVENOUS

## 2018-02-16 MED ORDER — SODIUM CHLORIDE 0.9 % WEIGHT BASED INFUSION: 1.0000 mL/kg/h | INTRAVENOUS | Status: DC

## 2018-02-16 SURGICAL SUPPLY — 10 items

## 2018-02-16 NOTE — Interval H&P Note (Signed)
History and Physical Interval Note:  02/16/2018 10:44 AM  Maria Ingram  has presented today for surgery, with the diagnosis of abnormal stress test  The various methods of treatment have been discussed with the patient and family. After consideration of risks, benefits and other options for treatment, the patient has consented to  Procedure(s): LEFT HEART CATH AND CORONARY ANGIOGRAPHY (N/A) as a surgical intervention .  The patient's history has been reviewed, patient examined, no change in status, stable for surgery.  I have reviewed the patient's chart and labs.  Questions were answered to the patient's satisfaction.   Cath Lab Visit (complete for each Cath Lab visit)  Clinical Evaluation Leading to the Procedure:   ACS: No.  Non-ACS:    Anginal Classification: CCS II  Anti-ischemic medical therapy: No Therapy  Non-Invasive Test Results: Intermediate-risk stress test findings: cardiac mortality 1-3%/year  Prior CABG: No previous CABG        [name] 02/16/2018 10:44 AM

## 2018-02-16 NOTE — Discharge Instructions (Addendum)
Cuidado del sitio del radio [facility]) Siga estas instrucciones durante las prximas semanas. Estas indicaciones le proporcionan informacin acerca de cmo deber cuidarse despus del procedimiento. El mdico tambin podr darle instrucciones ms especficas. El tratamiento ha sido planificado segn las prcticas mdicas actuales, pero en algunos casos pueden ocurrir problemas. Comunquese con el mdico si tiene algn problema o tiene dudas despus del procedimiento. QU ESPERAR DESPUS DEL PROCEDIMIENTO Despus del procedimiento, es normal tener lo siguiente:  Hematomas en el sitio del radio que suelen desaparecer en el trmino de 1 o 2semanas.  Acumulacin de sangre en el tejido (hematoma) que puede ser dolorosa al tacto. Generalmente, en 1 o 2 semanas deben disminuir su tamao y el dolor que produce con la palpacin. [place] los medicamentos solamente como se lo haya indicado el mdico.  Puede ducharse 24a 48horas despus del procedimiento o como se lo haya indicado el mdico. Retire el vendaje (apsito) y limpie suavemente el lugar de la insercin con agua y jabn comn. Seque bien el rea con una toalla limpia dando golpecitos. No frote el lugar, ya que Therapist, art.  No tome baos de inmersin, no nade ni use el jacuzzi hasta que el mdico lo autorice.  Revise diariamente el lugar de la insercin para ver si aparece enrojecimiento, hinchazn o secrecin.  No se aplique talcos ni lociones en el lugar.  No flexione ni doble el brazo afectado durante 24horas o como se lo haya indicado el mdico.  No haga esfuerzos ni levante objetos pesados con el brazo afectado durante 24horas o como se lo haya indicado el mdico.  No levante objetos que pesen ms de 10libras (4,5kg) durante los 5das posteriores al procedimiento o como se lo haya indicado el mdico.  Pregntele al mdico cundo puede hacer lo siguiente: ? Regresar a la  escuela o al [place]. ? Reanudar las actividades fsicas o los deportes que practica habitualmente. ? Reanudar la actividad sexual.  No conduzca el automvil por sus propios medios si le dan el alta el mismo da del procedimiento. Pdale a otra persona que lo lleve.  Puede conducir 24horas despus del procedimiento, a menos que el mdico le haya indicado lo contrario.  No opere maquinaria ni herramientas elctricas durante 24horas despus del procedimiento.  Si el procedimiento se hizo de [employer], lo que significa que regres a su casa el mismo da de su realizacin, un adulto responsable debe acompaarlo durante las primeras 24horas.  Concurra a todas las visitas de control como se lo haya indicado el mdico. Esto es importante. SOLICITE ATENCIN MDICA SI:  Jaclynn Guarneri.  Tiene escalofros.  Aumenta el sangrado en el sitio del radio. Haga presin Mudlogger. SOLICITE ATENCIN MDICA DE INMEDIATO SI:  Tiene un dolor que no es habitual en el sitio del radio.  Nota que el sitio del radio est enrojecido, caliente o hinchado.  Tiene secrecin (que no es una pequea cantidad de sangre en el vendaje) en el sitio del radio.  El sitio del radio est sangrando, y el sangrado no se detiene despus de 34minutos de Oceanographer una presin constante.  El brazo o la mano se le ponen plidos, fros o siente hormigueo o adormecimiento. Esta informacin no tiene Marine scientist el consejo del mdico. Asegrese de hacerle al mdico cualquier pregunta que tenga. Document Released: 11/15/2010 Document Revised: 08/11/2014 Document Reviewed: 02/06/2014 Elsevier Interactive Patient Education  2018 [place]  en los adultos, cuidados posteriores (Moderate Conscious Sedation, Adult, Care After) Estas indicaciones le proporcionan informacin acerca de cmo deber cuidarse despus del procedimiento. El mdico tambin podr darle instrucciones ms  especficas. El tratamiento ha sido planificado segn las prcticas mdicas actuales, pero en algunos casos pueden ocurrir problemas. Comunquese con el mdico si tiene algn problema o dudas despus del procedimiento. QU ESPERAR DESPUS DEL PROCEDIMIENTO Despus del procedimiento, es comn:  Sentirse somnoliento durante varias horas.  Sentirse torpe y [employer] de equilibrio durante varias horas.  Perder el sentido de la realidad durante varias horas.  Vomitar si come [employer]. INSTRUCCIONES PARA EL CUIDADO EN EL HOGAR Durante al menos 24horas despus del procedimiento:  No haga lo siguiente: ? Participar en actividades que impliquen posibles cadas o lesiones. ? Conducir vehculos. ? Operar maquinarias pesadas. ? Beber alcohol. ? Tomar somnferos o medicamentos que causen somnolencia. ? Firmar documentos legales ni tomar [employer]. ? Cuidar a nios por su cuenta.  Hacer reposo. Comida y bebida  Siga la dieta recomendada por el mdico.  Si vomita: ? Pruebe agua, jugo o sopa cuando usted pueda beber sin vomitar. ? Asegrese de no tener nuseas antes de ingerir alimentos slidos. Instrucciones generales  Permanezca con un adulto responsable hasta que est completamente despierto y consciente.  Tome los medicamentos de venta libre y los recetados solamente como se lo haya indicado el mdico.  Si fuma, no lo haga sin supervisin.  Concurra a todas las visitas de control como se lo haya indicado el mdico. Esto es importante. SOLICITE ATENCIN MDICA SI:  Sigue teniendo nuseas o vomitando.  Tiene sensacin de desvanecimiento.  Le aparece una erupcin cutnea.  Tiene fiebre. SOLICITE ATENCIN MDICA DE INMEDIATO SI:  Tiene dificultad para respirar. Esta informacin no tiene Marine scientist el consejo del mdico. Asegrese de hacerle al mdico cualquier pregunta que tenga. Document Released: 07/26/2013 Document Revised: 08/11/2014 Document  Reviewed: 11/10/2015 Elsevier Interactive Patient Education  2018 [employer].  May resume Lovenox injection this evening and coumadin

## 2018-02-16 NOTE — Progress Notes (Signed)
Daughter voices concerns about Lovenox when to stop. ("how long will she be on this"). Mendel Ryder, [place] called and asked states for the daughter to call the Dr that monitors her coumadin and ask them how long to continue Lovenox. Voices understanding and will restart coumadin and Lovenox this evening.

## 2018-02-17 ENCOUNTER — Encounter (HOSPITAL_COMMUNITY): Payer: Self-pay | Admitting: Cardiology

## 2018-03-16 ENCOUNTER — Encounter: Payer: Self-pay | Admitting: Cardiology

## 2018-03-16 ENCOUNTER — Ambulatory Visit (INDEPENDENT_AMBULATORY_CARE_PROVIDER_SITE_OTHER): Payer: Medicaid Other | Admitting: Cardiology

## 2018-03-16 VITALS — BP 126/66 | HR 92 | Ht 59.0 in | Wt 142.2 lb

## 2018-03-16 DIAGNOSIS — Z952 Presence of prosthetic heart valve: Secondary | ICD-10-CM | POA: Diagnosis not present

## 2018-03-16 DIAGNOSIS — R9439 Abnormal result of other cardiovascular function study: Secondary | ICD-10-CM

## 2018-03-16 DIAGNOSIS — I10 Essential (primary) hypertension: Secondary | ICD-10-CM

## 2018-03-16 DIAGNOSIS — E785 Hyperlipidemia, unspecified: Secondary | ICD-10-CM

## 2018-03-16 NOTE — Progress Notes (Signed)
Cardiology Office Note:    Date:  03/16/2018   ID:  Maria Ingram, DOB 03/23/1951, MRN [id number]  PCP:  Maryella Shivers, MD  Cardiologist:  Jenne Campus, MD    Referring MD: Maryella Shivers, MD   Chief Complaint  Patient presents with  . Follow-up    1 month follow up after LHC  Doing well  History of Present Illness:    Maria Ingram is a 67 y.o. female with mitral valve replacement with Saint Jude prosthesis done in [place] she came to me because of evaluation before knee surgery stress to show abnormality cardiac catheterization was done.  Unlikely her coronary arteries were normal in my opinion she is ready to go with surgery for her knee however she said she want to wait a little bit she is tired to fall procedures that she had done it she prefers to wait few months before proceeding with knee replacement surgery doing well asymptomatic no chest pain tightness squeezing pressure burning chest complain of having pain in her knee.  No palpitations  Past Medical History:  Diagnosis Date  . GERD (gastroesophageal reflux disease)   . History of rheumatic heart disease   . Hyperlipidemia   . Hypertension   . Pre-diabetes    Borderline  . Psoriasis     Past Surgical History:  Procedure Laterality Date  . LEFT HEART CATH AND CORONARY ANGIOGRAPHY N/A 02/16/2018   Procedure: LEFT HEART CATH AND CORONARY ANGIOGRAPHY;  Surgeon: Martinique, Peter M, MD;  Location: [facility];  Service: Cardiovascular;  Laterality: N/A;  . VALVE REPLACEMENT     St Jude mitral valve prosthesis    Current Medications: Current Meds  Medication Sig  . atorvastatin (LIPITOR) 10 MG tablet Take 10 mg by mouth daily.  . captopril (CAPOTEN) 25 MG tablet Take 25 mg by mouth 2 (two) times daily.  . citalopram (CELEXA) 10 MG tablet Take 10 mg by mouth daily.  . diphenhydrAMINE (BENADRYL) 50 MG capsule Take 50 mg by mouth every 6 (six) hours as needed.  . loratadine (CLARITIN)  10 MG tablet Take 10 mg by mouth daily.  Marland Kitchen omeprazole (PRILOSEC) 20 MG capsule Take 1 capsule (20 mg total) by mouth daily.  . ranitidine (ZANTAC) 150 MG tablet Take 150 mg by mouth daily.   Marland Kitchen spironolactone (ALDACTONE) 25 MG tablet Take 25 mg by mouth daily.  . traMADol (ULTRAM) 50 MG tablet Take 50 mg by mouth daily.   Marland Kitchen warfarin (COUMADIN) 3 MG tablet Take 3 mg by mouth See admin instructions. Alternates taking 3 mg one day then 4 mg the next  . warfarin (COUMADIN) 4 MG tablet Take 4 mg by mouth See admin instructions. Alternates taking 3 mg one day then 4 mg the next     Allergies:   Iodine   Social History   Socioeconomic History  . Marital status: Married    Spouse name: Not on file  . Number of children: Not on file  . Years of education: Not on file  . Highest education level: Not on file  Occupational History  . Not on file  Social Needs  . Financial resource strain: Not on file  . Food insecurity:    Worry: Not on file    Inability: Not on file  . Transportation needs:    Medical: Not on file    Non-medical: Not on file  Tobacco Use  . Smoking status: Never Smoker  . Smokeless tobacco: Never Used  Substance and  Sexual Activity  . Alcohol use: Not Currently  . Drug use: Never  . Sexual activity: Not on file  Lifestyle  . Physical activity:    Days per week: Not on file    Minutes per session: Not on file  . Stress: Not on file  Relationships  . Social connections:    Talks on phone: Not on file    Gets together: Not on file    Attends religious service: Not on file    Active member of club or organization: Not on file    Attends meetings of clubs or organizations: Not on file    Relationship status: Not on file  Other Topics Concern  . Not on file  Social History Narrative  . Not on file     Family History: The patient's family history includes Colon cancer in her father. ROS:   Please see the history of present illness.    All 14 point review of  systems negative except as described per history of present illness  EKGs/Labs/Other Studies Reviewed:      Recent Labs: 02/11/2018: BUN 13; Creatinine, Ser 0.67; Hemoglobin 12.7; Platelets 316; Potassium 5.0; Sodium 141  Recent Lipid Panel No results found for: CHOL, TRIG, HDL, CHOLHDL, VLDL, LDLCALC, LDLDIRECT  Physical Exam:    VS:  BP 126/66 (BP Location: Right Arm, Patient Position: Sitting, Cuff Size: Normal)   Pulse 92   Ht 4\' 11"  (1.499 m)   Wt 142 lb 3.2 oz (64.5 kg)   SpO2 98%   BMI 28.72 kg/m     Wt Readings from Last 3 Encounters:  03/16/18 142 lb 3.2 oz (64.5 kg)  02/16/18 140 lb (63.5 kg)  02/11/18 140 lb (63.5 kg)     GEN:  Well nourished, well developed in no acute distress HEENT: Normal NECK: No JVD; No carotid bruits LYMPHATICS: No lymphadenopathy CARDIAC: RRR, no murmurs, no rubs, no gallops crisp mechanical valve sounds present. RESPIRATORY:  Clear to auscultation without rales, wheezing or rhonchi  ABDOMEN: Soft, non-tender, non-distended MUSCULOSKELETAL:  No edema; No deformity  SKIN: Warm and dry LOWER EXTREMITIES: no swelling NEUROLOGIC:  Alert and oriented x 3 PSYCHIATRIC:  Normal affect   ASSESSMENT:    1. H/O mitral valve replacement with mechanical valve   2. Dyslipidemia   3. Essential hypertension   4. History of mitral valve prosthesis   5. Abnormal stress test    PLAN:    In order of problems listed above:  1. Mitral valve replacement mechanical prosthesis functioning properly continue present management INR target 2.53.5 2. Dyslipidemia followed by internal medicine team on Lipitor 10 mg which I will continue 3. Essential hypertension blood pressure well controlled continue present management.   Medication Adjustments/Labs and Tests Ordered: Current medicines are reviewed at length with the patient today.  Concerns regarding medicines are outlined above.  No orders of the defined types were placed in this  encounter.  Medication changes: No orders of the defined types were placed in this encounter.   Signed, Park Liter, MD, [facility] 03/16/2018 3:25 PM    [place]

## 2018-03-16 NOTE — Telephone Encounter (Signed)
error 

## 2018-03-16 NOTE — Patient Instructions (Signed)

## 2018-05-20 DIAGNOSIS — M25562 Pain in left knee: Secondary | ICD-10-CM

## 2018-05-20 HISTORY — DX: Pain in left knee: M25.562

## 2018-09-13 DIAGNOSIS — M1712 Unilateral primary osteoarthritis, left knee: Secondary | ICD-10-CM

## 2018-09-13 HISTORY — DX: Unilateral primary osteoarthritis, left knee: M17.12

## 2018-09-22 ENCOUNTER — Ambulatory Visit: Payer: Medicaid Other | Admitting: Cardiology

## 2018-09-22 ENCOUNTER — Encounter: Payer: Self-pay | Admitting: Cardiology

## 2018-09-22 VITALS — BP 130/72 | HR 65 | Ht 59.0 in | Wt 143.4 lb

## 2018-09-22 DIAGNOSIS — E785 Hyperlipidemia, unspecified: Secondary | ICD-10-CM

## 2018-09-22 DIAGNOSIS — Z952 Presence of prosthetic heart valve: Secondary | ICD-10-CM

## 2018-09-22 DIAGNOSIS — I10 Essential (primary) hypertension: Secondary | ICD-10-CM | POA: Diagnosis not present

## 2018-09-22 DIAGNOSIS — Z01818 Encounter for other preprocedural examination: Secondary | ICD-10-CM

## 2018-09-22 NOTE — Progress Notes (Signed)
Cardiology Office Note:    Date:  09/22/2018   ID:  Maria Ingram, DOB 1951-04-02, MRN [id number]  PCP:  Maryella Shivers, MD  Cardiologist:  Jenne Campus, MD    Referring MD: Maryella Shivers, MD   Chief Complaint  Patient presents with  . Follow-up  I need knee surgery  History of Present Illness:    Maria Ingram is a 68 y.o. female with mechanical mitral valve prosthesis last evaluation of the valve was done in the summer of last year at that time we also evaluated for coronary artery disease likely her coronary arteries were clear she comes today to my office because she required knee surgery.  My understanding is that left knee will be done only arthroscopically and the right knee need to be replaced minor stenting also as this can happen with 2 separate sessions.  Likely cardiac wise she is doing fine denies having chest pain tightness squeezing pressure been chest no shortness of breath but her ability to exercise is limited because of her new problem.  Past Medical History:  Diagnosis Date  . GERD (gastroesophageal reflux disease)   . History of rheumatic heart disease   . Hyperlipidemia   . Hypertension   . Pre-diabetes    Borderline  . Psoriasis     Past Surgical History:  Procedure Laterality Date  . LEFT HEART CATH AND CORONARY ANGIOGRAPHY N/A 02/16/2018   Procedure: LEFT HEART CATH AND CORONARY ANGIOGRAPHY;  Surgeon: Martinique, Peter M, MD;  Location: [facility];  Service: Cardiovascular;  Laterality: N/A;  . VALVE REPLACEMENT     St Jude mitral valve prosthesis    Current Medications: Current Meds  Medication Sig  . atorvastatin (LIPITOR) 10 MG tablet Take 10 mg by mouth daily.  . citalopram (CELEXA) 10 MG tablet Take 10 mg by mouth daily.  . diphenhydrAMINE (BENADRYL) 50 MG capsule Take 50 mg by mouth every 6 (six) hours as needed.  Marland Kitchen lisinopril (PRINIVIL,ZESTRIL) 2.5 MG tablet Take 2.5 mg by mouth daily.  Marland Kitchen loratadine  (CLARITIN) 10 MG tablet Take 10 mg by mouth daily.  Marland Kitchen omeprazole (PRILOSEC) 20 MG capsule Take 1 capsule (20 mg total) by mouth daily.  . ranitidine (ZANTAC) 150 MG tablet Take 150 mg by mouth daily.   . traMADol (ULTRAM) 50 MG tablet Take 50 mg by mouth daily.   Marland Kitchen warfarin (COUMADIN) 3 MG tablet Take 3 mg by mouth See admin instructions. Alternates taking 3 mg one day then 4 mg the next  . warfarin (COUMADIN) 4 MG tablet Take 4 mg by mouth See admin instructions. Alternates taking 3 mg one day then 4 mg the next     Allergies:   Iodine   Social History   Socioeconomic History  . Marital status: Married    Spouse name: Not on file  . Number of children: Not on file  . Years of education: Not on file  . Highest education level: Not on file  Occupational History  . Not on file  Social Needs  . Financial resource strain: Not on file  . Food insecurity:    Worry: Not on file    Inability: Not on file  . Transportation needs:    Medical: Not on file    Non-medical: Not on file  Tobacco Use  . Smoking status: Never Smoker  . Smokeless tobacco: Never Used  Substance and Sexual Activity  . Alcohol use: Not Currently  . Drug use: Never  . Sexual activity:  Not on file  Lifestyle  . Physical activity:    Days per week: Not on file    Minutes per session: Not on file  . Stress: Not on file  Relationships  . Social connections:    Talks on phone: Not on file    Gets together: Not on file    Attends religious service: Not on file    Active member of club or organization: Not on file    Attends meetings of clubs or organizations: Not on file    Relationship status: Not on file  Other Topics Concern  . Not on file  Social History Narrative  . Not on file     Family History: The patient's family history includes Colon cancer in her father. ROS:   Please see the history of present illness.    All 14 point review of systems negative except as described per history of present  illness  EKGs/Labs/Other Studies Reviewed:      Recent Labs: 02/11/2018: BUN 13; Creatinine, Ser 0.67; Hemoglobin 12.7; Platelets 316; Potassium 5.0; Sodium 141  Recent Lipid Panel No results found for: CHOL, TRIG, HDL, CHOLHDL, VLDL, LDLCALC, LDLDIRECT  Physical Exam:    VS:  BP 130/72   Pulse 65   Ht 4\' 11"  (1.499 m)   Wt 143 lb 6.4 oz (65 kg)   SpO2 95%   BMI 28.96 kg/m     Wt Readings from Last 3 Encounters:  09/22/18 143 lb 6.4 oz (65 kg)  03/16/18 142 lb 3.2 oz (64.5 kg)  02/16/18 140 lb (63.5 kg)     GEN:  Well nourished, well developed in no acute distress HEENT: Normal NECK: No JVD; No carotid bruits LYMPHATICS: No lymphadenopathy CARDIAC: RRR, no murmurs, no rubs, no gallops crisp mechanical valve sounds are present. RESPIRATORY:  Clear to auscultation without rales, wheezing or rhonchi  ABDOMEN: Soft, non-tender, non-distended MUSCULOSKELETAL:  No edema; No deformity  SKIN: Warm and dry LOWER EXTREMITIES: no swelling NEUROLOGIC:  Alert and oriented x 3 PSYCHIATRIC:  Normal affect   ASSESSMENT:    1. History of mitral valve prosthesis   2. Pre-operative clearance   3. Essential hypertension   4. Dyslipidemia    PLAN:    In order of problems listed above:  1. History of mitral valve prosthesis valve sounds good I cannot assess her ability to exercise I will schedule her to have echocardiogram to look at the valve before surgery.  I do not anticipate to find anything that will prevent me from not allowing her to have surgery.  Still echocardiogram need to be done. 2. Central hypertension blood pressure well controlled continue present management. 3. Lipidemia we will continue present management. 4.  5. For evaluation she need to be bridged with Lovenox before surgery.  She will have INR checked 6 days before surgery   Medication Adjustments/Labs and Tests Ordered: Current medicines are reviewed at length with the patient today.  Concerns regarding  medicines are outlined above.  No orders of the defined types were placed in this encounter.  Medication changes: No orders of the defined types were placed in this encounter.   Signed, Park Liter, MD, [facility] 09/22/2018 4:27 PM    [place]

## 2018-09-22 NOTE — Patient Instructions (Signed)
Medication Instructions:  Your physician recommends that you continue on your current medications as directed. Please refer to the Current Medication list given to you today.  If you need a refill on your cardiac medications before your next appointment, please call your pharmacy.   Lab work: None ordered If you have labs (blood work) drawn today and your tests are completely normal, you will receive your results only by: Marland Kitchen MyChart Message (if you have MyChart) OR . A paper copy in the mail If you have any lab test that is abnormal or we need to change your treatment, we will call you to review the results.  Testing/Procedures: Your physician has requested that you have an echocardiogram. Echocardiography is a painless test that uses sound waves to create images of your heart. It provides your doctor with information about the size and shape of your heart and how well your heart's chambers and valves are working. This procedure takes approximately one hour. There are no restrictions for this procedure.  Follow-Up: At [facility], you and your health needs are our priority.  As part of our continuing mission to provide you with exceptional heart care, we have created designated Provider Care Teams.  These Care Teams include your primary Cardiologist (physician) and Advanced Practice Providers (APPs -  Physician Assistants and Nurse Practitioners) who all work together to provide you with the care you need, when you need it. You will need a follow up appointment in 3 months.  You may see Jenne Campus or another member of our [employer] Provider Team in [place]: Shirlee More, MD . Jyl Heinz, MD  Any Other Special Instructions Will Be Listed Below (If Applicable). You need to have your PT/INR checked 6 days prior to your surgery.

## 2018-09-28 ENCOUNTER — Telehealth: Payer: Self-pay | Admitting: Cardiology

## 2018-09-28 DIAGNOSIS — Z7901 Long term (current) use of anticoagulants: Principal | ICD-10-CM

## 2018-09-28 DIAGNOSIS — Z5181 Encounter for therapeutic drug level monitoring: Secondary | ICD-10-CM

## 2018-09-28 NOTE — Telephone Encounter (Signed)
Yes, she will be bridged with lovenox  6 days before surgery we needs to get INR to decide abt timing

## 2018-09-28 NOTE — Telephone Encounter (Signed)
Left message for patient to return call.

## 2018-09-28 NOTE — Telephone Encounter (Signed)
Patient needs info regarding medications prior to surgery.

## 2018-09-28 NOTE — Telephone Encounter (Signed)
Patient called back and reports she is supposed to be getting some kind of injection (possibly heparin)  to take instead of coumadin before her knee surgery on 10/06/2018. However they have no prescription for this. I will confirm with Dr. Agustin Cree and follow back up with patient.

## 2018-09-28 NOTE — Telephone Encounter (Signed)
Patient needs to have inr checked per Dr. Agustin Cree before advising on lovenox bridge, informed patient's daughter of this. Patient will come tomorrow for labs drawn.

## 2018-09-29 LAB — PROTIME-INR
INR: 2.5 — ABNORMAL HIGH (ref 0.8–1.2)
Prothrombin Time: 24.4 s — ABNORMAL HIGH (ref 9.1–12.0)

## 2018-09-30 ENCOUNTER — Ambulatory Visit (HOSPITAL_BASED_OUTPATIENT_CLINIC_OR_DEPARTMENT_OTHER)
Admission: RE | Admit: 2018-09-30 | Discharge: 2018-09-30 | Disposition: A | Discharge status: Home / Self Care | Payer: Medicaid Other | Source: Ambulatory Visit | Attending: Cardiology | Admitting: Cardiology

## 2018-09-30 ENCOUNTER — Telehealth: Payer: Self-pay | Admitting: *Deleted

## 2018-09-30 ENCOUNTER — Telehealth: Payer: Self-pay | Admitting: Cardiology

## 2018-09-30 DIAGNOSIS — Z01818 Encounter for other preprocedural examination: Secondary | ICD-10-CM | POA: Diagnosis not present

## 2018-09-30 DIAGNOSIS — Z952 Presence of prosthetic heart valve: Secondary | ICD-10-CM | POA: Insufficient documentation

## 2018-09-30 NOTE — Progress Notes (Signed)
  Echocardiogram 2D Echocardiogram has been performed.  Maria Ingram 09/30/2018, 11:50 AM

## 2018-09-30 NOTE — Telephone Encounter (Signed)
Went over instructions with patient's granddaughter.

## 2018-09-30 NOTE — Telephone Encounter (Signed)
Lovenox bridge has been called into pharmacy and patient's family has been notified that it has been sent and when to start.

## 2018-09-30 NOTE — Telephone Encounter (Signed)
Pt 's grandaughter calling to verify instructions for injetible blood lthinner. Please advise

## 2018-09-30 NOTE — Telephone Encounter (Signed)
°*  STAT* If patient is at the pharmacy, call can be transferred to refill team.   1. Which medications need to be refilled? (please list name of each medication and dose if known) lovenox injector  2. Which pharmacy/location (including street and city if local pharmacy) is medication to be sent to?[facility] in [place]  3. Do they need a 30 day or 90 day supply? 1

## 2018-12-21 ENCOUNTER — Telehealth: Payer: Medicaid Other | Admitting: Cardiology

## 2019-02-23 ENCOUNTER — Telehealth: Payer: Medicaid Other | Admitting: Cardiology

## 2019-02-23 ENCOUNTER — Other Ambulatory Visit: Payer: Self-pay

## 2019-09-13 ENCOUNTER — Telehealth: Payer: Self-pay | Admitting: Cardiology

## 2019-09-13 NOTE — Telephone Encounter (Signed)
Called and spoke to patient's granddaughter per dpr. She reports the patients blood pressure has been lower for the past two weeks. The lowest they have seen it systolically was 96. She is reporting some dizziness and is just weak. She had a "blood infection" they said a couple weeks ago and was give antibiotics by her pcp. She is supposed to see them tomorrow but wants to see Dr. Agustin Cree because her blood pressure hasn't gotten better. She also hasn't been taking her lisinopril either for 2 weeks and it is still low. She is not having any fevers or any other symptoms right now. Will consult with Dr. Agustin Cree.

## 2019-09-13 NOTE — Telephone Encounter (Signed)
Left message for patient to return call.

## 2019-09-13 NOTE — Telephone Encounter (Signed)
Hold her lisinopril and please make arrangements for me to see her it can be when I be in [facility]

## 2019-09-13 NOTE — Telephone Encounter (Signed)
New messages   Pt c/o BP issue: STAT if pt c/o blurred vision, one-sided weakness or slurred speech  1. What are your last 5 BP readings?100/49   2. Are you having any other symptoms (ex. Dizziness, headache, blurred vision, passed out)? Tired, dizziness  3. What is your BP issue? Per patient's granddaughter b/p is low

## 2019-09-15 NOTE — Telephone Encounter (Signed)
Left message for patient to return call.

## 2019-09-19 NOTE — Telephone Encounter (Signed)
Left message for pt granddaughter to call  

## 2019-09-19 NOTE — Telephone Encounter (Signed)
Spoke with Maria Ingram, aware to stop lisinopril and follow up scheduled.

## 2019-09-28 ENCOUNTER — Ambulatory Visit (INDEPENDENT_AMBULATORY_CARE_PROVIDER_SITE_OTHER): Payer: Medicaid Other | Admitting: Cardiology

## 2019-09-28 ENCOUNTER — Encounter: Payer: Self-pay | Admitting: Cardiology

## 2019-09-28 ENCOUNTER — Other Ambulatory Visit: Payer: Self-pay

## 2019-09-28 VITALS — BP 158/80 | HR 90 | Ht 59.0 in | Wt 151.0 lb

## 2019-09-28 DIAGNOSIS — R05 Cough: Secondary | ICD-10-CM

## 2019-09-28 DIAGNOSIS — E785 Hyperlipidemia, unspecified: Secondary | ICD-10-CM

## 2019-09-28 DIAGNOSIS — R7303 Prediabetes: Secondary | ICD-10-CM

## 2019-09-28 DIAGNOSIS — Z952 Presence of prosthetic heart valve: Secondary | ICD-10-CM

## 2019-09-28 DIAGNOSIS — I10 Essential (primary) hypertension: Secondary | ICD-10-CM

## 2019-09-28 DIAGNOSIS — R0600 Dyspnea, unspecified: Secondary | ICD-10-CM

## 2019-09-28 DIAGNOSIS — R059 Cough, unspecified: Secondary | ICD-10-CM

## 2019-09-28 NOTE — Addendum Note (Signed)
Addended by: Linna Hoff R on: 09/28/2019 10:20 AM   Modules accepted: Orders

## 2019-09-28 NOTE — Progress Notes (Signed)
Cardiology Office Note:    Date:  09/28/2019   ID:  Maria Ingram, DOB 05-25-1951, MRN [id number]  PCP:  Maryella Shivers, MD  Cardiologist:  Jenne Campus, MD    Referring MD: Maryella Shivers, MD   No chief complaint on file. I am having problem with urinary tract  History of Present Illness:    Maria Ingram is a 69 y.o. female with past medical history significant for mechanical mitral valve prosthesis, done in [place] years ago, evaluation for coronary artery disease showed no significant problems,.  Comes to my office for regular follow-up.  She has been complaining of having frequent urinary tract infection.  She required multiple courses of antibiotic.  Also there was some talks about potentially having surgery for what understand she has difficulty explaining exactly what happened and I have no documentation of those events.  However, she was told that she is not a good candidate for surgery and that this problem can be managed conservatively.  I understand with some limited success she is doing better right now.  Concern today Is her blood pressure being elevated.  The situation when she gets urinary tract infection but her blood pressure was very low.  However now her blood pressure is fluctuating on the high normal numbers.  We talked about medication she takes and I strongly advised to continue taking lisinopril 2.5 daily.  On top of that she described to have some cough especially during cough when she lays down.  No unusual shortness of breath no swelling of lower extremities otherwise.  Past Medical History:  Diagnosis Date  . GERD (gastroesophageal reflux disease)   . History of rheumatic heart disease   . Hyperlipidemia   . Hypertension   . Pre-diabetes    Borderline  . Psoriasis     Past Surgical History:  Procedure Laterality Date  . LEFT HEART CATH AND CORONARY ANGIOGRAPHY N/A 02/16/2018   Procedure: LEFT HEART CATH AND CORONARY ANGIOGRAPHY;   Surgeon: Martinique, Peter M, MD;  Location: [facility];  Service: Cardiovascular;  Laterality: N/A;  . VALVE REPLACEMENT     St Jude mitral valve prosthesis    Current Medications: Current Meds  Medication Sig  . atorvastatin (LIPITOR) 10 MG tablet Take 10 mg by mouth daily.  . citalopram (CELEXA) 10 MG tablet Take 10 mg by mouth daily.  . diphenhydrAMINE (BENADRYL) 50 MG capsule Take 50 mg by mouth every 6 (six) hours as needed.  Marland Kitchen lisinopril-hydrochlorothiazide (ZESTORETIC) 20-12.5 MG tablet TAKE 1 TABLET BY MOUTH ONCE DAILY  . loratadine (CLARITIN) 10 MG tablet Take 10 mg by mouth daily.  . memantine (NAMENDA) 5 MG tablet Take 5 mg by mouth 2 times daily.  . ranitidine (ZANTAC) 150 MG tablet Take 150 mg by mouth daily.   . traMADol (ULTRAM) 50 MG tablet Take 50 mg by mouth daily.   Marland Kitchen triamcinolone cream (KENALOG) 0.5 % APPLY TO THE AFFECTED AREA(S) TWICE (2) DAILY AS NEEDED  . trimethoprim (TRIMPEX) 100 MG tablet Take 100 mg by mouth daily.  Marland Kitchen warfarin (COUMADIN) 4 MG tablet Take 4 mg by mouth See admin instructions. Alternates taking 3 mg one day then 4 mg the next     Allergies:   Iodine   Social History   Socioeconomic History  . Marital status: Married    Spouse name: Not on file  . Number of children: Not on file  . Years of education: Not on file  . Highest education level: Not on  file  Occupational History  . Not on file  Tobacco Use  . Smoking status: Never Smoker  . Smokeless tobacco: Never Used  Substance and Sexual Activity  . Alcohol use: Not Currently  . Drug use: Never  . Sexual activity: Not on file  Other Topics Concern  . Not on file  Social History Narrative  . Not on file   Social Determinants of Health   Financial Resource Strain:   . Difficulty of Paying Living Expenses: Not on file  Food Insecurity:   . Worried About Charity fundraiser in the Last Year: Not on file  . Ran Out of Food in the Last Year: Not on file  Transportation  Needs:   . Lack of Transportation (Medical): Not on file  . Lack of Transportation (Non-Medical): Not on file  Physical Activity:   . Days of Exercise per Week: Not on file  . Minutes of Exercise per Session: Not on file  Stress:   . Feeling of Stress : Not on file  Social Connections:   . Frequency of Communication with Friends and Family: Not on file  . Frequency of Social Gatherings with Friends and Family: Not on file  . Attends Religious Services: Not on file  . Active Member of Clubs or Organizations: Not on file  . Attends Archivist Meetings: Not on file  . Marital Status: Not on file     Family History: The patient's family history includes Colon cancer in her father. ROS:   Please see the history of present illness.    All 14 point review of systems negative except as described per history of present illness  EKGs/Labs/Other Studies Reviewed:    EKG showed normal sinus rhythm, normal P interval nonspecific ST segment changes  Recent Labs: No results found for requested labs within last 8760 hours.  Recent Lipid Panel No results found for: CHOL, TRIG, HDL, CHOLHDL, VLDL, LDLCALC, LDLDIRECT  Physical Exam:    VS:  BP (!) 158/80   Pulse 90   Ht 4\' 11"  (1.499 m)   Wt 151 lb (68.5 kg)   SpO2 94%   BMI 30.50 kg/m     Wt Readings from Last 3 Encounters:  09/28/19 151 lb (68.5 kg)  09/22/18 143 lb 6.4 oz (65 kg)  03/16/18 142 lb 3.2 oz (64.5 kg)     GEN:  Well nourished, well developed in no acute distress HEENT: Normal NECK: No JVD; No carotid bruits LYMPHATICS: No lymphadenopathy CARDIAC: RRR, mechanical valve sounds are normal.  There is soft systolic murmur grade 1/6 at the left border of the sternum, no rubs, no gallops RESPIRATORY:  Clear to auscultation without rales, wheezing or rhonchi  ABDOMEN: Soft, non-tender, non-distended MUSCULOSKELETAL:  No edema; No deformity  SKIN: Warm and dry LOWER EXTREMITIES: no swelling NEUROLOGIC:  Alert  and oriented x 3 PSYCHIATRIC:  Normal affect   ASSESSMENT:    1. History of mitral valve prosthesis   2. Dyslipidemia   3. Borderline diabetes   4. H/O mitral valve replacement with mechanical valve   5. Essential hypertension    PLAN:    In order of problems listed above:  1. History of mitral valve prostheses.  Normal function on last echocardiogram from end of last year.  We will continue monitoring.  I will ask her to have proBNP as well as Chem-7 today trying to determine if she does have some subclinical congestive heart failure that manifests itself with cough at  night. 2. Dyslipidemia she is on Lipitor which I will continue. 3. Borderline diabetes that be followed by antimedicine team. 4. Essential hypertension ask her to keep checking her blood pressure on the regular basis.   Medication Adjustments/Labs and Tests Ordered: Current medicines are reviewed at length with the patient today.  Concerns regarding medicines are outlined above.  No orders of the defined types were placed in this encounter.  Medication changes: No orders of the defined types were placed in this encounter.   Signed, Park Liter, MD, [facility] 09/28/2019 10:11 AM    [place]

## 2019-09-28 NOTE — Patient Instructions (Signed)
Medication Instructions:  Your physician recommends that you continue on your current medications as directed. Please refer to the Current Medication list given to you today.  *If you need a refill on your cardiac medications before your next appointment, please call your pharmacy*  Lab Work: Your physician recommends that you return for lab work today: bmp, pro bnp   If you have labs (blood work) drawn today and your tests are completely normal, you will receive your results only by: Marland Kitchen MyChart Message (if you have MyChart) OR . A paper copy in the mail If you have any lab test that is abnormal or we need to change your treatment, we will call you to review the results.  Testing/Procedures: None.  Follow-Up: At [facility], you and your health needs are our priority.  As part of our continuing mission to provide you with exceptional heart care, we have created designated Provider Care Teams.  These Care Teams include your primary Cardiologist (physician) and Advanced Practice Providers (APPs -  Physician Assistants and Nurse Practitioners) who all work together to provide you with the care you need, when you need it.  Your next appointment:   1 month(s)  The format for your next appointment:   In Person  Provider:   Jenne Campus, MD  Other Instructions

## 2019-09-29 LAB — BASIC METABOLIC PANEL
BUN/Creatinine Ratio: 21 (ref 12–28)
BUN: 24 mg/dL (ref 8–27)
CO2: 22 mmol/L (ref 20–29)
Calcium: 9.2 mg/dL (ref 8.7–10.3)
Chloride: 105 mmol/L (ref 96–106)
Creatinine, Ser: 1.12 mg/dL — ABNORMAL HIGH (ref 0.57–1.00)
GFR calc Af Amer: 58 mL/min/{1.73_m2} — ABNORMAL LOW (ref >59)
GFR calc non Af Amer: 50 mL/min/{1.73_m2} — ABNORMAL LOW (ref >59)
Glucose: 126 mg/dL — ABNORMAL HIGH (ref 65–99)
Potassium: 4.5 mmol/L (ref 3.5–5.2)
Sodium: 142 mmol/L (ref 134–144)

## 2019-09-29 LAB — PRO B NATRIURETIC PEPTIDE: NT-Pro BNP: 238 pg/mL (ref 0–301)

## 2019-10-28 ENCOUNTER — Encounter: Payer: Self-pay | Admitting: Gastroenterology

## 2019-10-28 ENCOUNTER — Ambulatory Visit: Payer: Medicaid Other | Admitting: Gastroenterology

## 2019-10-28 ENCOUNTER — Other Ambulatory Visit: Payer: Self-pay

## 2019-10-28 VITALS — BP 136/68 | HR 83 | Temp 97.2°F | Ht 59.0 in | Wt 150.4 lb

## 2019-10-28 DIAGNOSIS — R197 Diarrhea, unspecified: Secondary | ICD-10-CM

## 2019-10-28 DIAGNOSIS — R109 Unspecified abdominal pain: Secondary | ICD-10-CM | POA: Diagnosis not present

## 2019-10-28 MED ORDER — PREDNISONE 50 MG PO TABS: ORAL_TABLET | ORAL | 0 refills | Status: DC

## 2019-10-28 NOTE — Progress Notes (Signed)
Chief Complaint: For colorectal cancer screening and reflux  Referring Provider: Dr. Sheral Apley      ASSESSMENT AND PLAN;   #1. Generalized Abdo pain with left lower quadrant abdominal tenderness #2. Diarrhea. Stool neg for infection (Dr Nyra Capes) #3. H/O rectocele (followed by Dr. Marvel Plan) #4. FH colon cancer  (dad at age 71)- but pt on coumadin for mech MVR.  Discussed 3 options (1.  Watchful waiting 2.  Cologuard  3.  Colonoscopy -discussed risks and benefits for each.  She opted for Cologuard previously-ordered but not done)  Plan: - CT Abdo/pelvis with p.o. and IV contrast.  She has questionable allergy to iodine.  I do not think it is true allergy.  Still, we will desensitize her prior.. - Please obtain previous records especially last stool studies from [facility]. - Stop omeprazole.  - Continue dexilant - Check CBC, CMP, CRP -Stool studies for GI Pathogen (includes C. Diff), culture,O&P, fecal elastase, fat and Calprotectin) - FU in 4 weeks - ImodiumAD BID. - If she continues to have problems then we would consider EGD/colonoscopy off Coumadin, after cardiology clearance.  She may need Lovenox/IV heparin bridging prior.      HPI:    Maria Ingram is a 69 y.o. female  Seen as an emergency workin History through interpreter. Has been having generalized abdominal pain especially left lower quadrant with associated chills but no fever. Had urinary tract infection a month ago, given p.o. trimethoprim. Thereafter started having more problems specially with diarrhea-currently complains of bowel movements at the frequency of 4-5 times per day.  She will occasionally have nocturnal symptoms as well.  No melena or hematochezia.  Very much distressed with symptoms.  Last dose of antibiotics was on 10/08/2019.  She had stool studies by Dr. Nyra Capes which was negative per patient's family.  Also has some epigastric discomfort especially after eating.  Has been taking omeprazole  and Dexilant together. No nausea or vomiting No lower abdominal pain. No nausea, vomiting,  regurgitation, odynophagia or dysphagia.  No significant diarrhea or constipation.  There is no melena or hematochezia. No unintentional weight loss. 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  She was evaluated in [place] and was told to hold off on colonoscopy as she could not be taken off Coumadin. Has been followed by Dr. Raliegh Ip here    Past Medical History:  Diagnosis Date  . GERD (gastroesophageal reflux disease)   . History of rheumatic heart disease   . Hyperlipidemia   . Hypertension   . Pre-diabetes    Borderline  . Psoriasis     Past Surgical History:  Procedure Laterality Date  . LEFT HEART CATH AND CORONARY ANGIOGRAPHY N/A 02/16/2018   Procedure: LEFT HEART CATH AND CORONARY ANGIOGRAPHY;  Surgeon: Martinique, Peter M, MD;  Location: [place] [facility];  Service: Cardiovascular;  Laterality: N/A;  . VALVE REPLACEMENT     St Jude mitral valve prosthesis    Family History  Problem Relation Age of Onset  . Colon cancer Father     Social History   Tobacco Use  . Smoking status: Never Smoker  . Smokeless tobacco: Never Used  Substance Use Topics  . Alcohol use: Not Currently  . Drug use: Never    Current Outpatient Medications  Medication Sig Dispense Refill  . warfarin (COUMADIN) 4 MG tablet Take 4 mg by mouth See admin instructions. Alternates taking 3 mg one day then 4 mg the next    .  atorvastatin (LIPITOR) 10 MG tablet Take 10 mg by mouth daily.    . citalopram (CELEXA) 10 MG tablet Take 10 mg by mouth daily.    . diphenhydrAMINE (BENADRYL) 50 MG capsule Take 50 mg by mouth every 6 (six) hours as needed.    Marland Kitchen lisinopril-hydrochlorothiazide (ZESTORETIC) 20-12.5 MG tablet TAKE 1 TABLET BY MOUTH ONCE DAILY    . loratadine (CLARITIN) 10 MG tablet Take 10 mg by mouth daily.    . memantine (NAMENDA) 5 MG tablet Take 5 mg by mouth 2 times daily.    . ranitidine  (ZANTAC) 150 MG tablet Take 150 mg by mouth daily.     . traMADol (ULTRAM) 50 MG tablet Take 50 mg by mouth daily.     Marland Kitchen triamcinolone cream (KENALOG) 0.5 % APPLY TO THE AFFECTED AREA(S) TWICE (2) DAILY AS NEEDED    . trimethoprim (TRIMPEX) 100 MG tablet Take 100 mg by mouth daily.     No current facility-administered medications for this visit.    Allergies  Allergen Reactions  . Iodine     Eye swelling, reaction happened previously but has used since without reaction.  Unsure if this is still an allergy     Review of Systems:  neg     Physical Exam:    Ht 4\' 11"  (1.499 m)   Wt 150 lb 6 oz (68.2 kg)   BMI 30.37 kg/m  Filed Weights   10/28/19 1613  Weight: 150 lb 6 oz (68.2 kg)   Constitutional:  Well-developed, in no acute distress. Psychiatric: Normal mood and affect. Behavior is normal. HEENT: Pupils normal.  Conjunctivae are normal. No scleral icterus. Neck supple.  Cardiovascular: Normal rate, regular rhythm. No edema Pulmonary/chest: Effort normal and breath sounds normal. No wheezing, rales or rhonchi. Abdominal: Soft, nondistended, mildly bloated, generalized abdominal tenderness especially left lower quadrant.  No rebound.  Bowel sounds active throughout. There are no masses palpable. No hepatomegaly. Rectal:  defered Neurological: Alert and oriented to person place and time. Skin: Skin is warm and dry. No rashes noted. Have discussed in detail with the patient, patient's 2 daughters through the interpreter.  Carmell Austria, MD 10/28/2019, 4:20 PM  Cc: Dr. Sheral Apley

## 2019-10-28 NOTE — Patient Instructions (Addendum)
If you are age 71 or older, your body mass index should be between 23-30. Your Body mass index is 30.37 kg/m. If this is out of the aforementioned range listed, please consider follow up with your Primary Care Provider.  If you are age 67 or younger, your body mass index should be between 19-25. Your Body mass index is 30.37 kg/m. If this is out of the aformentioned range listed, please consider follow up with your Primary Care Provider.   You have been scheduled for a CT [facility] the [facility] [place] [address] [facility]).   You are scheduled on 11/03/19 at [place] should arrive 15 minutes prior to your appointment time for registration. Please follow the written instructions below on the day of your exam:  WARNING: IF YOU ARE ALLERGIC TO IODINE/X-RAY DYE, PLEASE NOTIFY RADIOLOGY IMMEDIATELY AT [phone]! YOU WILL BE GIVEN A 13 HOUR PREMEDICATION PREP.  1) Do not eat or drink anything after 5am (4 hours prior to your test) 2) You have been given 2 bottles of oral contrast to drink. The solution may taste better if refrigerated, but do NOT add ice or any other liquid to this solution. Shake well before drinking.    Drink 1 bottle of contrast @ 7am (2 hours prior to your exam)  Drink 1 bottle of contrast @ 8am (1 hour prior to your exam)  You may take any medications as prescribed with a small amount of water, if necessary. If you take any of the following medications: METFORMIN, GLUCOPHAGE, GLUCOVANCE, AVANDAMET, RIOMET, FORTAMET, [place] MET, JANUMET, GLUMETZA or METAGLIP, you MAY be asked to HOLD this medication 48 hours AFTER the exam.  The purpose of you drinking the oral contrast is to aid in the visualization of your intestinal tract. The contrast solution may cause some diarrhea. Depending on your individual set of symptoms, you may also receive an intravenous injection of x-ray contrast/dye. Plan on being at [facility] for 30 minutes or longer, depending on the type of exam you are having performed.  This test typically takes 30-45 minutes to complete.  If you have any questions regarding your exam or if you need to reschedule, you may call the CT department at [phone] between the hours of 8:00 am and 5:00 pm, Monday-Friday.  ________________________________________________________________________  Please go to the lab at [facility] ([place] You will need to go to level "B", you do not need an appointment for this. Hours available are 7:30 am - 4:30 pm.    We have sent the following medications to your pharmacy for you to pick up at your convenience: Prednisone 50 mg Take one tablet by mouth 13 hours before CT. Take one tablet by mouth 7 hours before CT.  Take one tablet by mouth 1 hour before CT.   Benadryl 25 mg one hour before CT   Stop Omeprazole  Continue Dexilant   Follow up in 4 weeks.   Thank you,  Dr. Jackquline Denmark

## 2019-10-31 ENCOUNTER — Other Ambulatory Visit (INDEPENDENT_AMBULATORY_CARE_PROVIDER_SITE_OTHER): Payer: Medicaid Other

## 2019-10-31 DIAGNOSIS — R109 Unspecified abdominal pain: Secondary | ICD-10-CM

## 2019-10-31 DIAGNOSIS — R197 Diarrhea, unspecified: Secondary | ICD-10-CM

## 2019-10-31 LAB — CBC WITH DIFFERENTIAL/PLATELET
Basophils Absolute: 0.1 10*3/uL (ref 0.0–0.1)
Basophils Relative: 1.1 % (ref 0.0–3.0)
Eosinophils Absolute: 1.3 10*3/uL — ABNORMAL HIGH (ref 0.0–0.7)
Eosinophils Relative: 15.6 % — ABNORMAL HIGH (ref 0.0–5.0)
HCT: 26.3 % — ABNORMAL LOW (ref 36.0–46.0)
Hemoglobin: 9.1 g/dL — ABNORMAL LOW (ref 12.0–15.0)
Lymphocytes Relative: 22.9 % (ref 12.0–46.0)
Lymphs Abs: 1.9 10*3/uL (ref 0.7–4.0)
MCHC: 34.4 g/dL (ref 30.0–36.0)
MCV: 91.9 fl (ref 78.0–100.0)
Monocytes Absolute: 0.7 10*3/uL (ref 0.1–1.0)
Monocytes Relative: 8.1 % (ref 3.0–12.0)
Neutro Abs: 4.4 10*3/uL (ref 1.4–7.7)
Neutrophils Relative %: 52.3 % (ref 43.0–77.0)
Platelets: 292 10*3/uL (ref 150.0–400.0)
RBC: 2.87 Mil/uL — ABNORMAL LOW (ref 3.87–5.11)
RDW: 14.3 % (ref 11.5–15.5)
WBC: 8.3 10*3/uL (ref 4.0–10.5)

## 2019-10-31 LAB — COMPREHENSIVE METABOLIC PANEL
ALT: 13 U/L (ref 0–35)
AST: 16 U/L (ref 0–37)
Albumin: 4 g/dL (ref 3.5–5.2)
Alkaline Phosphatase: 69 U/L (ref 39–117)
BUN: 20 mg/dL (ref 6–23)
CO2: 25 mEq/L (ref 19–32)
Calcium: 8.8 mg/dL (ref 8.4–10.5)
Chloride: 106 mEq/L (ref 96–112)
Creatinine, Ser: 0.86 mg/dL (ref 0.40–1.20)
GFR: 65.06 mL/min (ref >60.00)
Glucose, Bld: 86 mg/dL (ref 70–99)
Potassium: 3.5 mEq/L (ref 3.5–5.1)
Sodium: 138 mEq/L (ref 135–145)
Total Bilirubin: 0.4 mg/dL (ref 0.2–1.2)
Total Protein: 7.3 g/dL (ref 6.0–8.3)

## 2019-10-31 LAB — C-REACTIVE PROTEIN: CRP: <1 mg/dL (ref 0.5–20.0)

## 2019-11-02 ENCOUNTER — Other Ambulatory Visit: Payer: Medicaid Other

## 2019-11-02 DIAGNOSIS — R109 Unspecified abdominal pain: Secondary | ICD-10-CM

## 2019-11-02 DIAGNOSIS — R197 Diarrhea, unspecified: Secondary | ICD-10-CM

## 2019-11-03 ENCOUNTER — Other Ambulatory Visit: Payer: Medicaid Other

## 2019-11-03 ENCOUNTER — Other Ambulatory Visit: Payer: Self-pay

## 2019-11-03 ENCOUNTER — Encounter (HOSPITAL_BASED_OUTPATIENT_CLINIC_OR_DEPARTMENT_OTHER): Payer: Self-pay

## 2019-11-03 ENCOUNTER — Ambulatory Visit (HOSPITAL_BASED_OUTPATIENT_CLINIC_OR_DEPARTMENT_OTHER)
Admission: RE | Admit: 2019-11-03 | Discharge: 2019-11-03 | Disposition: A | Discharge status: Home / Self Care | Payer: Medicaid Other | Source: Ambulatory Visit | Attending: Gastroenterology | Admitting: Gastroenterology

## 2019-11-03 DIAGNOSIS — R109 Unspecified abdominal pain: Secondary | ICD-10-CM | POA: Diagnosis not present

## 2019-11-03 DIAGNOSIS — R197 Diarrhea, unspecified: Secondary | ICD-10-CM | POA: Diagnosis present

## 2019-11-03 MED ORDER — IOHEXOL 300 MG/ML  SOLN
100.0000 mL | Freq: Once | INTRAMUSCULAR | Status: AC | PRN
  Administered 2019-11-03: 09:00:00 100 mL via INTRAVENOUS

## 2019-11-04 LAB — GI PROFILE, STOOL, PCR

## 2019-11-08 ENCOUNTER — Ambulatory Visit: Payer: Medicaid Other | Admitting: Cardiology

## 2019-11-10 ENCOUNTER — Other Ambulatory Visit (INDEPENDENT_AMBULATORY_CARE_PROVIDER_SITE_OTHER): Payer: Medicaid Other

## 2019-11-10 ENCOUNTER — Ambulatory Visit (INDEPENDENT_AMBULATORY_CARE_PROVIDER_SITE_OTHER): Payer: Medicaid Other | Admitting: Nurse Practitioner

## 2019-11-10 ENCOUNTER — Encounter: Payer: Self-pay | Admitting: Nurse Practitioner

## 2019-11-10 ENCOUNTER — Other Ambulatory Visit: Payer: Self-pay

## 2019-11-10 VITALS — BP 142/70 | HR 81 | Temp 98.0°F | Ht 59.0 in | Wt 152.4 lb

## 2019-11-10 DIAGNOSIS — K219 Gastro-esophageal reflux disease without esophagitis: Secondary | ICD-10-CM

## 2019-11-10 DIAGNOSIS — Z800 Family history of malignant neoplasm of digestive organs: Secondary | ICD-10-CM | POA: Diagnosis not present

## 2019-11-10 DIAGNOSIS — D649 Anemia, unspecified: Secondary | ICD-10-CM

## 2019-11-10 DIAGNOSIS — R197 Diarrhea, unspecified: Secondary | ICD-10-CM

## 2019-11-10 HISTORY — DX: Anemia, unspecified: D64.9

## 2019-11-10 LAB — CBC
HCT: 28.2 % — ABNORMAL LOW (ref 36.0–46.0)
Hemoglobin: 9.6 g/dL — ABNORMAL LOW (ref 12.0–15.0)
MCHC: 34.1 g/dL (ref 30.0–36.0)
MCV: 92.8 fl (ref 78.0–100.0)
Platelets: 277 10*3/uL (ref 150.0–400.0)
RBC: 3.04 Mil/uL — ABNORMAL LOW (ref 3.87–5.11)
RDW: 14.2 % (ref 11.5–15.5)
WBC: 7.4 10*3/uL (ref 4.0–10.5)

## 2019-11-10 LAB — PROTIME-INR
INR: 2.5 ratio — ABNORMAL HIGH (ref 0.8–1.0)
Prothrombin Time: 28.1 s — ABNORMAL HIGH (ref 9.6–13.1)

## 2019-11-10 LAB — IBC + FERRITIN
Ferritin: 79.7 ng/mL (ref 10.0–291.0)
Iron: 51 ug/dL (ref 42–145)
Saturation Ratios: 15.3 % — ABNORMAL LOW (ref 20.0–50.0)
Transferrin: 238 mg/dL (ref 212.0–360.0)

## 2019-11-10 LAB — VITAMIN B12: Vitamin B-12: 320 pg/mL (ref 211–911)

## 2019-11-10 LAB — FOLATE: Folate: 13.1 ng/mL (ref >5.9)

## 2019-11-10 NOTE — Progress Notes (Signed)
11/10/2019 Maria Ingram [id number] 12/04/1950   Chief Complaint: diarrhea follow up   History of Present Illness:  Maria Ingram is a 69 year old female with a past medical history of hypertension, rheumatic heart diease s/p mechanical MVR on Coumadin, pre diabetes, psoriasis, rectocele and GERD. She was last seen in our office by Dr. [name] on 10/28/2019 for further evaluation for generalized abdominal pain and diarrhea. Her diarrhea started after she took Trimethoprim for a UTI 10/08/2019.  Possible EGD/colonoscopy considered if her symptoms persisted or worsened. An abdominal/pelvic CT with contrast, labs and GI pathogen panel were completed. She presents today accompanied by her daughter and a spanish interpretor to review these results.   Labs 10/31/2019: Hg 9.1 (baseline Hg 11.4 on 07/15/2019). HCT 26.3. MCV 91.9. PLT 292.  CRP < 1. CMP was normal.   GI pathogen 11/02/2019 was negative. O & P negative. Pancreatic elastase 213.  Abdominal/Pelvic CT with contrast 11/03/2019: No acute findings in the abdomen or pelvis. Specifically, no findings to explain the patient's history of diarrhea and intermittent abdominal pain. Aortic Atherosclerois   Currently she denies having any abdominal pain. Her diarrhea has improved on Imodium 2 tabs po bid. For the last 3 days she is passing 2 to 3 thick mud like stools daily.  No further fecal incontinence but she has seen a few spots of stool leakage on her depends. No rectal bleeding or black stools. Stool color is light brown. She stopped taking Omeprazole as ordered by Dr. [name]. She is taking Dexilant 60mg  once daily. He continues to have night time heartburn a few days weekly. No dysphagia. She denies having any chest pain, dizziness or SOB. She has seen her gynecologist regarding a rectocele. Her daughter was concerned she might also have rectal prolapse. Father with history of colon cancer diagnosed at the age of 59. She  reported having a cologuard test done a few years ago which was negative.     CBC Latest Ref Rng & Units 10/31/2019 02/11/2018  WBC 4.0 - 10.5 K/uL 8.3 7.8  Hemoglobin 12.0 - 15.0 g/dL 9.1 Repeated and verified X2.(L) 12.7  Hematocrit 36.0 - 46.0 % 26.3(L) 37.6  Platelets 150.0 - 400.0 K/uL 292.0 316  MCV 91.9.  CMP Latest Ref Rng & Units 10/31/2019 09/28/2019 02/11/2018  Glucose 70 - 99 mg/dL 86 126(H) 89  BUN 6 - 23 mg/dL 20 24 13   Creatinine 0.40 - 1.20 mg/dL 0.86 1.12(H) 0.67  Sodium 135 - 145 mEq/L 138 142 141  Potassium 3.5 - 5.1 mEq/L 3.5 4.5 5.0  Chloride 96 - 112 mEq/L 106 105 99  CO2 19 - 32 mEq/L 25 22 27   Calcium 8.4 - 10.5 mg/dL 8.8 9.2 9.8  Total Protein 6.0 - 8.3 g/dL 7.3 - -  Total Bilirubin 0.2 - 1.2 mg/dL 0.4 - -  Alkaline Phos 39 - 117 U/L 69 - -  AST 0 - 37 U/L 16 - -  ALT 0 - 35 U/L 13 - -  CRP < 1.    Current Outpatient Medications on File Prior to Visit  Medication Sig Dispense Refill  . atorvastatin (LIPITOR) 10 MG tablet Take 10 mg by mouth daily.    . citalopram (CELEXA) 10 MG tablet Take 10 mg by mouth daily.    Marland Kitchen dexlansoprazole (DEXILANT) 60 MG capsule Take 60 mg by mouth daily.    Marland Kitchen lisinopril (ZESTRIL) 20 MG tablet Take 20 mg by mouth daily.    Marland Kitchen loratadine (CLARITIN)  10 MG tablet Take 10 mg by mouth daily.    . memantine (NAMENDA) 5 MG tablet Take 5 mg by mouth 2 times daily.    Marland Kitchen triamcinolone cream (KENALOG) 0.5 % APPLY TO THE AFFECTED AREA(S) TWICE (2) DAILY AS NEEDED    . warfarin (COUMADIN) 4 MG tablet Take 4 mg by mouth See admin instructions. Alternates taking 3 mg one day then 4 mg the next     No current facility-administered medications on file prior to visit.    Allergies  Allergen Reactions  . Iodine     Eye swelling, reaction happened previously but has used since without reaction.  Unsure if this is still an allergy      Current Medications, Allergies, Past Medical History, Past Surgical History, Family History and Social  History were reviewed in [employer] record.   Physical Exam: BP (!) 142/70   Pulse 81   Temp 98 F (36.7 C)   Ht 4\' 11"  (1.499 m)   Wt 152 lb 6 oz (69.1 kg)   BMI 30.78 kg/m  General: Well developed 69 year old female in no acute distress. Head: Normocephalic and atraumatic. Eyes: No scleral icterus. Conjunctiva pink . Ears: Normal auditory acuity. Mouth: Upper and lower dentures intact. No ulcers or lesions.  Lungs: Clear throughout to auscultation. Heart: RRR, soft systolic murmur. Mechanic MV sounds present.  Abdomen: Soft, protuberant. Mild tenderness to the epigastric, RUQ and throughout the lower abdomen without rebound or guarding. No masses or hepatomegaly. Normal bowel sounds x 4 quadrants.  Rectal: No external hemorrhoids. Small internal hemorrhoids. Diminished anal sphincter tone. Light golden brown soft stool in the rectum guaiac negative. No rectal prolapse when bearing down.  Musculoskeletal: Symmetrical with no gross deformities. Extremities: No edema. Neurological: Alert oriented x 4. No focal deficits.  Psychological: Alert and cooperative. Normal mood and affect  Assessment and Recommendations:  67. 69 year old female with normocytic anemia. Hg 9.1.(baseline Hg 11.4 on 12/22020).  HCT 26.3. MCV 91.9. No signs of active GI bleeding. Rectal exam today showed light brown stool guaiac negative.  Query anemia from hemolysis from mechanical valve? Normal T. Bili.  -CBC, iron panel, Vitamin B12, folate and PT/INR  -Await the above lab results, if patient shows iron deficiency anemia an EGD and colonoscopy at [facility] with cardiac clearance to be considered. Further recommendations per Dr. [name].  -Patient aware to go to the local ER if she develops CP, SOB, dizziness or profound anemia.  -Request copy of most recent CBC from PCP Dr. Nyra Capes in [place]  2. GERD.  -Continue Dexilant 60mg  once daily for now  3. Diarrhea, improving. GI pathogen panel  negative. C. Diff negative. Pancreatic elastase normal.  -Continue Imodium 2 tabs po bid, reduce dose if stool becomes hard, stop if no BM in 24 hours.   4. Colon cancer screening. Father with history of colon cancer. CTAP 4/01 without evidence of a colon mass.  -See plan in # 1  4. Past MVR, St. Jude valve on Coumadin

## 2019-11-10 NOTE — Patient Instructions (Addendum)
If you are age 71 or older, your body mass index should be between 23-30. Your Body mass index is 30.78 kg/m. If this is out of the aforementioned range listed, please consider follow up with your Primary Care Provider.  If you are age 48 or younger, your body mass index should be between 19-25. Your Body mass index is 30.78 kg/m. If this is out of the aformentioned range listed, please consider follow up with your Primary Care Provider.   Please go to the lab at [facility] ([place] You will need to go to level "B", you do not need an appointment for this. Hours available are 7:30 am - 4:30 pm.  You will need to do this today.  Continue Dexilant 60 mg once daily.   Continue Immodium 2 tablets twice daily reduce dose if stools become hard. Stop if no bowel movement in 24 hours.   Go to Emergency Room if you develop chest pain, shortness of breath, dizziness or prolonged weakness.   Thank you,  Carl Best, NP

## 2019-11-11 LAB — STOOL CULTURE
MICRO NUMBER:: 10312689
MICRO NUMBER:: 10312690
MICRO NUMBER:: 10312692
SHIGA RESULT:: NOT DETECTED
SPECIMEN QUALITY:: ADEQUATE
SPECIMEN QUALITY:: ADEQUATE
SPECIMEN QUALITY:: ADEQUATE

## 2019-11-11 LAB — PANCREATIC ELASTASE, FECAL: Pancreatic Elastase-1, Stool: 213 mcg/g

## 2019-11-11 LAB — OVA AND PARASITE EXAMINATION
CONCENTRATE RESULT:: NONE SEEN
MICRO NUMBER:: 10312691
SPECIMEN QUALITY:: ADEQUATE
TRICHROME RESULT:: NONE SEEN

## 2019-11-11 LAB — CALPROTECTIN: Calprotectin: 186 mcg/g — ABNORMAL HIGH

## 2019-11-11 LAB — FECAL FAT, QUALITATIVE: FECAL FAT, QUALITATIVE: NORMAL

## 2019-11-20 NOTE — Progress Notes (Signed)
Patient with new onset anemia Has diarrhea as well. Fecal calprotectin is positive but the rest of the stool studies were negative  Recheck CBC in 2 weeks. EGD/colonoscopy at [facility] off Coumadin, after cardiology clearance.  She may need Lovenox/IV heparin bridging prior (depending upon cardiology recommendations)

## 2019-11-21 ENCOUNTER — Telehealth: Payer: Self-pay

## 2019-11-21 ENCOUNTER — Other Ambulatory Visit: Payer: Self-pay

## 2019-11-21 DIAGNOSIS — D649 Anemia, unspecified: Secondary | ICD-10-CM

## 2019-11-21 NOTE — Telephone Encounter (Signed)
Patient with diagnosis of mechanical (St Jude) mital Valve on warfarin for anticoagulation.    Procedure: endoscopy Date of procedure: tbd  CrCl = 32ml/min Platelet count = 292  Per office protocol, patient can hold WARFARIN for 5 days prior to procedure.   Patient WILL need bridging with Lovenox (enoxaparin) around procedure.  *Patinet NOT follow by our coumadin clinic. Please forward recommendation to current coumadin provider to arrange bridging*

## 2019-11-21 NOTE — Telephone Encounter (Signed)
[facility] Pre-operative Risk Assessment     Request for surgical clearance:     Endoscopy Procedure  What type of surgery is being performed?     EGD/Colon  When is this surgery scheduled?     Not scheduled at this ti,e   What type of clearance is required ?   Pharmacy  Are there any medications that need to be held prior to surgery and how long? Coumadin  Practice name and name of physician performing surgery?      [facility]  What is your office phone and fax number?      Phone- [phone]  [phone]  Anesthesia type (None, local, MAC, general) ?       MAC

## 2019-11-30 NOTE — Telephone Encounter (Signed)
Patients daughter said that she thinks that her mothers Coumadin is handled by the patients primary care provider. I have sent a request over to hold Coumadin and Lovenox bridging.

## 2019-12-06 ENCOUNTER — Ambulatory Visit (INDEPENDENT_AMBULATORY_CARE_PROVIDER_SITE_OTHER): Payer: Medicaid Other | Admitting: Cardiology

## 2019-12-06 ENCOUNTER — Other Ambulatory Visit: Payer: Self-pay

## 2019-12-06 ENCOUNTER — Encounter: Payer: Self-pay | Admitting: Cardiology

## 2019-12-06 VITALS — BP 126/54 | HR 74 | Ht 59.0 in | Wt 151.2 lb

## 2019-12-06 DIAGNOSIS — I10 Essential (primary) hypertension: Secondary | ICD-10-CM | POA: Diagnosis not present

## 2019-12-06 DIAGNOSIS — Z952 Presence of prosthetic heart valve: Secondary | ICD-10-CM | POA: Diagnosis not present

## 2019-12-06 DIAGNOSIS — R7303 Prediabetes: Secondary | ICD-10-CM | POA: Diagnosis not present

## 2019-12-06 NOTE — Addendum Note (Signed)
Addended by: Ashok Norris on: 12/06/2019 02:36 PM   Modules accepted: Orders

## 2019-12-06 NOTE — Progress Notes (Signed)
Cardiology Office Note:    Date:  12/06/2019   ID:  Maria Ingram, DOB February 06, 1951, MRN [id number]  PCP:  Maryella Shivers, MD  Cardiologist:  Jenne Campus, MD    Referring MD: Maryella Shivers, MD   No chief complaint on file. Doing well but stressed out about anemia  History of Present Illness:    Maria Ingram is a 69 y.o. female with past medical history significant for mitral valve replacement with mechanical prosthesis that was done in [place] many years ago, and evaluation for coronary artery disease was negative recently she has been struggling with urination for several she did have a repeated infection of urinary tract required multiple antibiotic.  She was offered some surgery but eventually was disqualified as being simply too sick to have it.  Now the problem appears to be a anemia.  She did see GI specialist there is some concerns about possibly colonoscopy.  However so far has not been done.  Overall she is doing well she is doing try to walk she walks every single day in the morning she does get some short of breath but overall doing well from that point of view.  Past Medical History:  Diagnosis Date  . GERD (gastroesophageal reflux disease)   . History of rheumatic heart disease   . Hyperlipidemia   . Hypertension   . Pre-diabetes    Borderline  . Psoriasis     Past Surgical History:  Procedure Laterality Date  . LEFT HEART CATH AND CORONARY ANGIOGRAPHY N/A 02/16/2018   Procedure: LEFT HEART CATH AND CORONARY ANGIOGRAPHY;  Surgeon: Martinique, Peter M, MD;  Location: [place] [facility];  Service: Cardiovascular;  Laterality: N/A;  . VALVE REPLACEMENT     St Jude mitral valve prosthesis    Current Medications: Current Meds  Medication Sig  . atorvastatin (LIPITOR) 10 MG tablet Take 10 mg by mouth daily.  . citalopram (CELEXA) 10 MG tablet Take 10 mg by mouth daily.  Marland Kitchen dexlansoprazole (DEXILANT) 60 MG capsule Take 60 mg by mouth daily.  Marland Kitchen  lisinopril (ZESTRIL) 20 MG tablet Take 20 mg by mouth daily.  Marland Kitchen loratadine (CLARITIN) 10 MG tablet Take 10 mg by mouth daily.  . memantine (NAMENDA) 5 MG tablet Take 5 mg by mouth 2 times daily.  Marland Kitchen triamcinolone cream (KENALOG) 0.5 % APPLY TO THE AFFECTED AREA(S) TWICE (2) DAILY AS NEEDED  . warfarin (COUMADIN) 4 MG tablet Take 4 mg by mouth See admin instructions. Alternates taking 3 mg one day then 4 mg the next     Allergies:   Iodine   Social History   Socioeconomic History  . Marital status: Married    Spouse name: Not on file  . Number of children: Not on file  . Years of education: Not on file  . Highest education level: Not on file  Occupational History  . Not on file  Tobacco Use  . Smoking status: Never Smoker  . Smokeless tobacco: Never Used  Substance and Sexual Activity  . Alcohol use: Not Currently  . Drug use: Never  . Sexual activity: Not on file  Other Topics Concern  . Not on file  Social History Narrative  . Not on file   Social Determinants of Health   Financial Resource Strain:   . Difficulty of Paying Living Expenses:   Food Insecurity:   . Worried About Charity fundraiser in the Last Year:   . [place] in the Last Year:  Transportation Needs:   . Film/video editor (Medical):   Marland Kitchen Lack of Transportation (Non-Medical):   Physical Activity:   . Days of Exercise per Week:   . Minutes of Exercise per Session:   Stress:   . Feeling of Stress :   Social Connections:   . Frequency of Communication with Friends and Family:   . Frequency of Social Gatherings with Friends and Family:   . Attends Religious Services:   . Active Member of Clubs or Organizations:   . Attends Archivist Meetings:   Marland Kitchen Marital Status:      Family History: The patient's family history includes Colon cancer in her father. ROS:   Please see the history of present illness.    All 14 point review of systems negative except as described per history of  present illness  EKGs/Labs/Other Studies Reviewed:      Recent Labs: 09/28/2019: NT-Pro BNP 238 10/31/2019: ALT 13; BUN 20; Creatinine, Ser 0.86; Potassium 3.5; Sodium 138 11/10/2019: Hemoglobin 9.6; Platelets 277.0  Recent Lipid Panel No results found for: CHOL, TRIG, HDL, CHOLHDL, VLDL, LDLCALC, LDLDIRECT  Physical Exam:    VS:  BP (!) 126/54   Pulse 74   Ht 4\' 11"  (1.499 m)   Wt 151 lb 3.2 oz (68.6 kg)   SpO2 96%   BMI 30.54 kg/m     Wt Readings from Last 3 Encounters:  12/06/19 151 lb 3.2 oz (68.6 kg)  11/10/19 152 lb 6 oz (69.1 kg)  10/28/19 150 lb 6 oz (68.2 kg)     GEN:  Well nourished, well developed in no acute distress HEENT: Normal NECK: No JVD; No carotid bruits LYMPHATICS: No lymphadenopathy CARDIAC: RRR, mechanical valve sounds are crisp.  No murmurs, no rubs, no gallops RESPIRATORY:  Clear to auscultation without rales, wheezing or rhonchi  ABDOMEN: Soft, non-tender, non-distended MUSCULOSKELETAL:  No edema; No deformity  SKIN: Warm and dry LOWER EXTREMITIES: no swelling NEUROLOGIC:  Alert and oriented x 3 PSYCHIATRIC:  Normal affect   ASSESSMENT:    1. H/O mitral valve replacement with mechanical valve   2. Borderline diabetes   3. Essential hypertension   4. History of mitral valve prosthesis    PLAN:    In order of problems listed above:  1. History of mitral valve replacement with mechanical valve.  I will ask her to have echocardiogram to reassess the valve as well as ejection fraction. 2. Following that he is doing well from that point review. 3. Essential hypertension blood pressure 126/54.  Continue present management. 4. Anemia I will check her CBC today.  From my standpoint reviewed we can pursue colonoscopy.  I would favor to lower her INR or favorably bridge her with Lovenox.  She does have mechanical mitral valve therefore she is at high risk of CVA.   Medication Adjustments/Labs and Tests Ordered: Current medicines are reviewed at  length with the patient today.  Concerns regarding medicines are outlined above.  No orders of the defined types were placed in this encounter.  Medication changes: No orders of the defined types were placed in this encounter.   Signed, Park Liter, MD, [facility] 12/06/2019 2:23 PM    [place]

## 2019-12-06 NOTE — Patient Instructions (Signed)
Medication Instructions:  Your physician recommends that you continue on your current medications as directed. Please refer to the Current Medication list given to you today.  *If you need a refill on your cardiac medications before your next appointment, please call your pharmacy*   Lab Work: Your physician recommends that you return for lab work today: cbc, Bmp   If you have labs (blood work) drawn today and your tests are completely normal, you will receive your results only by: Marland Kitchen MyChart Message (if you have MyChart) OR . A paper copy in the mail If you have any lab test that is abnormal or we need to change your treatment, we will call you to review the results.   Testing/Procedures: Your physician has requested that you have an echocardiogram. Echocardiography is a painless test that uses sound waves to create images of your heart. It provides your doctor with information about the size and shape of your heart and how well your heart's chambers and valves are working. This procedure takes approximately one hour. There are no restrictions for this procedure.     Follow-Up: At [facility], you and your health needs are our priority.  As part of our continuing mission to provide you with exceptional heart care, we have created designated Provider Care Teams.  These Care Teams include your primary Cardiologist (physician) and Advanced Practice Providers (APPs -  Physician Assistants and Nurse Practitioners) who all work together to provide you with the care you need, when you need it.  We recommend signing up for the patient portal called "MyChart".  Sign up information is provided on this After Visit Summary.  MyChart is used to connect with patients for Virtual Visits (Telemedicine).  Patients are able to view lab/test results, encounter notes, upcoming appointments, etc.  Non-urgent messages can be sent to your provider as well.   To learn more about what you can do with MyChart, go  to [email or web address].    Your next appointment:   3 month(s)  The format for your next appointment:   In Person  Provider:   Jenne Campus, MD   Other Instructions   Echocardiogram An echocardiogram is a procedure that uses painless sound waves (ultrasound) to produce an image of the heart. Images from an echocardiogram can provide important information about:  Signs of coronary artery disease (CAD).  Aneurysm detection. An aneurysm is a weak or damaged part of an artery wall that bulges out from the normal force of blood pumping through the body.  Heart size and shape. Changes in the size or shape of the heart can be associated with certain conditions, including heart failure, aneurysm, and CAD.  Heart muscle function.  Heart valve function.  Signs of a past heart attack.  Fluid buildup around the heart.  Thickening of the heart muscle.  A tumor or infectious growth around the heart valves. Tell a health care provider about:  Any allergies you have.  All medicines you are taking, including vitamins, herbs, eye drops, creams, and over-the-counter medicines.  Any blood disorders you have.  Any surgeries you have had.  Any medical conditions you have.  Whether you are pregnant or may be pregnant. What are the risks? Generally, this is a safe procedure. However, problems may occur, including:  Allergic reaction to dye (contrast) that may be used during the procedure. What happens before the procedure? No specific preparation is needed. You may eat and drink normally. What happens during the procedure?   An IV  tube may be inserted into one of your veins.  You may receive contrast through this tube. A contrast is an injection that improves the quality of the pictures from your heart.  A gel will be applied to your chest.  A wand-like tool (transducer) will be moved over your chest. The gel will help to transmit the sound waves from the  transducer.  The sound waves will harmlessly bounce off of your heart to allow the heart images to be captured in real-time motion. The images will be recorded on a computer. The procedure may vary among health care providers and hospitals. What happens after the procedure?  You may return to your normal, everyday life, including diet, activities, and medicines, unless your health care provider tells you not to do that. Summary  An echocardiogram is a procedure that uses painless sound waves (ultrasound) to produce an image of the heart.  Images from an echocardiogram can provide important information about the size and shape of your heart, heart muscle function, heart valve function, and fluid buildup around your heart.  You do not need to do anything to prepare before this procedure. You may eat and drink normally.  After the echocardiogram is completed, you may return to your normal, everyday life, unless your health care provider tells you not to do that. This information is not intended to replace advice given to you by your health care provider. Make sure you discuss any questions you have with your health care provider. Document Revised: 11/11/2018 Document Reviewed: 08/23/2016 Elsevier Patient Education  [place].

## 2019-12-07 LAB — CBC
Hematocrit: 31.2 % — ABNORMAL LOW (ref 34.0–46.6)
Hemoglobin: 10.4 g/dL — ABNORMAL LOW (ref 11.1–15.9)
MCH: 31.2 pg (ref 26.6–33.0)
MCHC: 33.3 g/dL (ref 31.5–35.7)
MCV: 94 fL (ref 79–97)
Platelets: 305 10*3/uL (ref 150–450)
RBC: 3.33 x10E6/uL — ABNORMAL LOW (ref 3.77–5.28)
RDW: 12.7 % (ref 11.7–15.4)
WBC: 7.7 10*3/uL (ref 3.4–10.8)

## 2019-12-07 LAB — BASIC METABOLIC PANEL
BUN/Creatinine Ratio: 28 (ref 12–28)
BUN: 24 mg/dL (ref 8–27)
CO2: 24 mmol/L (ref 20–29)
Calcium: 9.1 mg/dL (ref 8.7–10.3)
Chloride: 99 mmol/L (ref 96–106)
Creatinine, Ser: 0.87 mg/dL (ref 0.57–1.00)
GFR calc Af Amer: 78 mL/min/{1.73_m2} (ref >59)
GFR calc non Af Amer: 67 mL/min/{1.73_m2} (ref >59)
Glucose: 131 mg/dL — ABNORMAL HIGH (ref 65–99)
Potassium: 4.1 mmol/L (ref 3.5–5.2)
Sodium: 138 mmol/L (ref 134–144)

## 2019-12-13 NOTE — Telephone Encounter (Signed)
I have refaxed request to Dr. Nyra Capes, as we have still not received a response.

## 2019-12-21 NOTE — Telephone Encounter (Signed)
I have refaxed request and left a message for the nurse.

## 2019-12-23 ENCOUNTER — Other Ambulatory Visit: Payer: Medicaid Other

## 2019-12-28 NOTE — Telephone Encounter (Signed)
I refaxed request again.

## 2020-01-13 ENCOUNTER — Telehealth: Payer: Self-pay

## 2020-01-13 NOTE — Telephone Encounter (Signed)
I spoke with Larene Beach at Dr Nyra Capes office yesterday she states Dr Nyra Capes does not manage pts coumadin,send to Dr. Agustin Cree.

## 2020-01-16 ENCOUNTER — Ambulatory Visit (INDEPENDENT_AMBULATORY_CARE_PROVIDER_SITE_OTHER): Payer: Medicaid Other

## 2020-01-16 ENCOUNTER — Other Ambulatory Visit: Payer: Self-pay

## 2020-01-16 DIAGNOSIS — Z952 Presence of prosthetic heart valve: Secondary | ICD-10-CM

## 2020-01-16 NOTE — Telephone Encounter (Signed)
So we are restarting with the situation regular.  When it is her colonoscopy schedule.  We can take care of her Coumadin and bridging

## 2020-01-16 NOTE — Progress Notes (Signed)
Complete echocardiogram has been performed.  Jimmy Tron Flythe RDCS, RVT 

## 2020-01-17 NOTE — Telephone Encounter (Signed)
Attempted to call patient. No answer and voicemail box not set up, will continue efforts.

## 2020-01-18 NOTE — Telephone Encounter (Signed)
Spoke with patients granddaughter [name].  I told her that her grandmother will be placed on a waiting list for Endo/colon at [facility].  We will contact patient when a date becomes available.

## 2020-01-18 NOTE — Telephone Encounter (Signed)
Proceed with EGD/colonoscopy at [place] per cardiology  RG

## 2020-01-20 ENCOUNTER — Telehealth: Payer: Self-pay | Admitting: Cardiology

## 2020-01-20 NOTE — Telephone Encounter (Signed)
Called patient with interpreter on the line and informed her of results. No further further questions.

## 2020-01-20 NOTE — Telephone Encounter (Signed)
    Pt and pt's daughter returning call about echo results. Both of them doesn't speak english, they requested interpreter when nurse call them back.

## 2020-03-07 ENCOUNTER — Encounter: Payer: Self-pay | Admitting: Cardiology

## 2020-03-07 ENCOUNTER — Other Ambulatory Visit: Payer: Self-pay

## 2020-03-07 ENCOUNTER — Ambulatory Visit (INDEPENDENT_AMBULATORY_CARE_PROVIDER_SITE_OTHER): Payer: Medicaid Other | Admitting: Cardiology

## 2020-03-07 VITALS — BP 138/68 | HR 80 | Ht 59.0 in | Wt 151.6 lb

## 2020-03-07 DIAGNOSIS — Z952 Presence of prosthetic heart valve: Secondary | ICD-10-CM

## 2020-03-07 DIAGNOSIS — I773 Arterial fibromuscular dysplasia: Secondary | ICD-10-CM | POA: Diagnosis not present

## 2020-03-07 DIAGNOSIS — E785 Hyperlipidemia, unspecified: Secondary | ICD-10-CM

## 2020-03-07 DIAGNOSIS — I10 Essential (primary) hypertension: Secondary | ICD-10-CM | POA: Diagnosis not present

## 2020-03-07 DIAGNOSIS — R0600 Dyspnea, unspecified: Secondary | ICD-10-CM

## 2020-03-07 DIAGNOSIS — R7303 Prediabetes: Secondary | ICD-10-CM

## 2020-03-07 DIAGNOSIS — R0609 Other forms of dyspnea: Secondary | ICD-10-CM

## 2020-03-07 DIAGNOSIS — Z01818 Encounter for other preprocedural examination: Secondary | ICD-10-CM

## 2020-03-07 NOTE — Patient Instructions (Signed)
Medication Instructions:  Your physician recommends that you continue on your current medications as directed. Please refer to the Current Medication list given to you today.  *If you need a refill on your cardiac medications before your next appointment, please call your pharmacy*   Lab Work: Your physician recommends that you return for lab work today: cbc, bmp, pro bnp   If you have labs (blood work) drawn today and your tests are completely normal, you will receive your results only by: Marland Kitchen MyChart Message (if you have MyChart) OR . A paper copy in the mail If you have any lab test that is abnormal or we need to change your treatment, we will call you to review the results.   Testing/Procedures: None.    Follow-Up: At [facility], you and your health needs are our priority.  As part of our continuing mission to provide you with exceptional heart care, we have created designated Provider Care Teams.  These Care Teams include your primary Cardiologist (physician) and Advanced Practice Providers (APPs -  Physician Assistants and Nurse Practitioners) who all work together to provide you with the care you need, when you need it.  We recommend signing up for the patient portal called "MyChart".  Sign up information is provided on this After Visit Summary.  MyChart is used to connect with patients for Virtual Visits (Telemedicine).  Patients are able to view lab/test results, encounter notes, upcoming appointments, etc.  Non-urgent messages can be sent to your provider as well.   To learn more about what you can do with MyChart, go to [email or web address].    Your next appointment:   3 month(s)  The format for your next appointment:   In Person  Provider:   Jenne Campus, MD   Other Instructions

## 2020-03-07 NOTE — Progress Notes (Signed)
Cardiology Office Note:    Date:  03/07/2020   ID:  Maria Ingram, DOB 05/14/1951, MRN [id number]  PCP:  Maryella Shivers, MD  Cardiologist:  Jenne Campus, MD    Referring MD: Maryella Shivers, MD   No chief complaint on file. I am doing fine  History of Present Illness:    Maria Ingram is a 69 y.o. female with past medical history significant for mitral valve replacement with mechanical valve done couple years ago in [place],, dyslipidemia, hypertension, prediabetes.  Recently she was find to be mildly anemic.  Colonoscopy has been planned.  She is here to be evaluated before the procedure.  Overall she is doing well during the day she has no difficulties however she describes some wheezes at night when she lay down flat and back.  There was situation she had to sit up.  Echocardiogram recently done revealed normal functioning mitral valve prosthesis without significant pathology, left ventricle ejection fraction was normal in function however transmitral flow pattern was characteristic for restrictive pattern.  That could contribute to her symptomatology.  Denies have any palpitations no dizziness no swelling of lower extremities.  She also complained of having pain in her right knee.  Past Medical History:  Diagnosis Date  . GERD (gastroesophageal reflux disease)   . History of rheumatic heart disease   . Hyperlipidemia   . Hypertension   . Pre-diabetes    Borderline  . Psoriasis     Past Surgical History:  Procedure Laterality Date  . LEFT HEART CATH AND CORONARY ANGIOGRAPHY N/A 02/16/2018   Procedure: LEFT HEART CATH AND CORONARY ANGIOGRAPHY;  Surgeon: Martinique, Peter M, MD;  Location: [place] [facility];  Service: Cardiovascular;  Laterality: N/A;  . VALVE REPLACEMENT     St Jude mitral valve prosthesis    Current Medications: Current Meds  Medication Sig  . atorvastatin (LIPITOR) 10 MG tablet Take 10 mg by mouth daily.  . citalopram (CELEXA) 10 MG  tablet Take 10 mg by mouth daily.  Marland Kitchen dexlansoprazole (DEXILANT) 60 MG capsule Take 60 mg by mouth daily.  Marland Kitchen loratadine (CLARITIN) 10 MG tablet Take 10 mg by mouth daily.  . memantine (NAMENDA) 5 MG tablet Take 5 mg by mouth 2 times daily.  Marland Kitchen triamcinolone cream (KENALOG) 0.5 % APPLY TO THE AFFECTED AREA(S) TWICE (2) DAILY AS NEEDED  . warfarin (COUMADIN) 4 MG tablet Take 4 mg by mouth See admin instructions. Alternates taking 3 mg one day then 4 mg the next     Allergies:   Iodine   Social History   Socioeconomic History  . Marital status: Married    Spouse name: Not on file  . Number of children: Not on file  . Years of education: Not on file  . Highest education level: Not on file  Occupational History  . Not on file  Tobacco Use  . Smoking status: Never Smoker  . Smokeless tobacco: Never Used  Vaping Use  . Vaping Use: Never used  Substance and Sexual Activity  . Alcohol use: Not Currently  . Drug use: Never  . Sexual activity: Not on file  Other Topics Concern  . Not on file  Social History Narrative  . Not on file   Social Determinants of Health   Financial Resource Strain:   . Difficulty of Paying Living Expenses:   Food Insecurity:   . Worried About Charity fundraiser in the Last Year:   . [place] in the Last  Year:   Transportation Needs:   . Film/video editor (Medical):   Marland Kitchen Lack of Transportation (Non-Medical):   Physical Activity:   . Days of Exercise per Week:   . Minutes of Exercise per Session:   Stress:   . Feeling of Stress :   Social Connections:   . Frequency of Communication with Friends and Family:   . Frequency of Social Gatherings with Friends and Family:   . Attends Religious Services:   . Active Member of Clubs or Organizations:   . Attends Archivist Meetings:   Marland Kitchen Marital Status:      Family History: The patient's family history includes Colon cancer in her father. ROS:   Please see the history of present  illness.    All 14 point review of systems negative except as described per history of present illness  EKGs/Labs/Other Studies Reviewed:      Recent Labs: 09/28/2019: NT-Pro BNP 238 10/31/2019: ALT 13 12/06/2019: BUN 24; Creatinine, Ser 0.87; Hemoglobin 10.4; Platelets 305; Potassium 4.1; Sodium 138  Recent Lipid Panel No results found for: CHOL, TRIG, HDL, CHOLHDL, VLDL, LDLCALC, LDLDIRECT  Physical Exam:    VS:  BP 138/68   Pulse 80   Ht 4\' 11"  (1.499 m)   Wt 151 lb 9.6 oz (68.8 kg)   SpO2 94%   BMI 30.62 kg/m     Wt Readings from Last 3 Encounters:  03/07/20 151 lb 9.6 oz (68.8 kg)  12/06/19 151 lb 3.2 oz (68.6 kg)  11/10/19 152 lb 6 oz (69.1 kg)     GEN:  Well nourished, well developed in no acute distress HEENT: Normal NECK: No JVD; No carotid bruits LYMPHATICS: No lymphadenopathy CARDIAC: RRR, no murmurs, no rubs, no gallops RESPIRATORY:  Clear to auscultation without rales, wheezing or rhonchi  ABDOMEN: Soft, non-tender, non-distended MUSCULOSKELETAL:  No edema; No deformity  SKIN: Warm and dry LOWER EXTREMITIES: no swelling NEUROLOGIC:  Alert and oriented x 3 PSYCHIATRIC:  Normal affect   ASSESSMENT:    1. Essential hypertension   2. Arterial fibromuscular dysplasia (HCC)   3. Dyspnea on exertion   4. H/O mitral valve replacement with mechanical valve   5. Dyslipidemia   6. Borderline diabetes   7. Pre-operative clearance    PLAN:    In order of problems listed above:  1. Status post mitral valve replacement valve functioning properly to mechanical valve.  Target INR is 3. 2. Some wheezes at evening time when she lay down flat in the bed.  I will check proBNP as well as Chem-7 to decide if we can augment her diuresis. 3. Preop evaluation for possible colonoscopy because of anemia.  We will check her CBC today again.  From cardiac standpoint of view should be no problem to pursue colonoscopy, however, I prefer not to interrupt anticoagulation.  She does  have mechanical valve in mitral position therefore if we decided to do some invasive procedure like a biopsy.  She may require bridging with Lovenox. 4. Dyslipidemia: I do have her fasting lipid profile and K PN showing LDL 69 HDL 52.  We will continue present management which include Lipitor 10 mg daily. 5. Diabetes.  Stable.  6. Essential hypertension.  Blood pressure well controlled.   Medication Adjustments/Labs and Tests Ordered: Current medicines are reviewed at length with the patient today.  Concerns regarding medicines are outlined above.  Orders Placed This Encounter  Procedures  . CBC  . Basic metabolic panel  . Pro  b natriuretic peptide (BNP)   Medication changes: No orders of the defined types were placed in this encounter.   Signed, Park Liter, MD, [facility] 03/07/2020 8:18 AM    [place]

## 2020-03-08 LAB — BASIC METABOLIC PANEL
BUN/Creatinine Ratio: 25 (ref 12–28)
BUN: 20 mg/dL (ref 8–27)
CO2: 22 mmol/L (ref 20–29)
Calcium: 9.1 mg/dL (ref 8.7–10.3)
Chloride: 101 mmol/L (ref 96–106)
Creatinine, Ser: 0.8 mg/dL (ref 0.57–1.00)
GFR calc Af Amer: 86 mL/min/{1.73_m2} (ref >59)
GFR calc non Af Amer: 74 mL/min/{1.73_m2} (ref >59)
Glucose: 134 mg/dL — ABNORMAL HIGH (ref 65–99)
Potassium: 4.4 mmol/L (ref 3.5–5.2)
Sodium: 137 mmol/L (ref 134–144)

## 2020-03-08 LAB — CBC
Hematocrit: 35.1 % (ref 34.0–46.6)
Hemoglobin: 11.3 g/dL (ref 11.1–15.9)
MCH: 29.8 pg (ref 26.6–33.0)
MCHC: 32.2 g/dL (ref 31.5–35.7)
MCV: 93 fL (ref 79–97)
Platelets: 318 10*3/uL (ref 150–450)
RBC: 3.79 x10E6/uL (ref 3.77–5.28)
RDW: 12.5 % (ref 11.7–15.4)
WBC: 7.6 10*3/uL (ref 3.4–10.8)

## 2020-03-08 LAB — PRO B NATRIURETIC PEPTIDE: NT-Pro BNP: 154 pg/mL (ref 0–301)

## 2020-03-13 ENCOUNTER — Ambulatory Visit: Payer: Medicaid Other | Admitting: Cardiology

## 2020-04-17 ENCOUNTER — Encounter: Payer: Self-pay | Admitting: Emergency Medicine

## 2020-04-24 ENCOUNTER — Telehealth: Payer: Self-pay | Admitting: Cardiology

## 2020-04-24 NOTE — Telephone Encounter (Signed)
Called patient daughter informed her of results of labs per dpr.

## 2020-04-24 NOTE — Telephone Encounter (Signed)
Granddaughter is calling for results.

## 2020-05-16 ENCOUNTER — Other Ambulatory Visit: Payer: Self-pay | Admitting: Gastroenterology

## 2020-06-08 DIAGNOSIS — K219 Gastro-esophageal reflux disease without esophagitis: Secondary | ICD-10-CM | POA: Insufficient documentation

## 2020-06-08 DIAGNOSIS — I10 Essential (primary) hypertension: Secondary | ICD-10-CM | POA: Insufficient documentation

## 2020-06-08 DIAGNOSIS — Z8679 Personal history of other diseases of the circulatory system: Secondary | ICD-10-CM | POA: Insufficient documentation

## 2020-06-08 DIAGNOSIS — E785 Hyperlipidemia, unspecified: Secondary | ICD-10-CM | POA: Insufficient documentation

## 2020-06-08 DIAGNOSIS — R7303 Prediabetes: Secondary | ICD-10-CM | POA: Insufficient documentation

## 2020-06-11 ENCOUNTER — Ambulatory Visit (INDEPENDENT_AMBULATORY_CARE_PROVIDER_SITE_OTHER): Payer: Medicaid Other | Admitting: Cardiology

## 2020-06-11 ENCOUNTER — Encounter: Payer: Self-pay | Admitting: Cardiology

## 2020-06-11 ENCOUNTER — Other Ambulatory Visit: Payer: Self-pay

## 2020-06-11 VITALS — BP 140/64 | HR 72 | Ht 59.0 in | Wt 147.0 lb

## 2020-06-11 DIAGNOSIS — I10 Essential (primary) hypertension: Secondary | ICD-10-CM | POA: Diagnosis not present

## 2020-06-11 DIAGNOSIS — Z952 Presence of prosthetic heart valve: Secondary | ICD-10-CM | POA: Diagnosis not present

## 2020-06-11 DIAGNOSIS — K219 Gastro-esophageal reflux disease without esophagitis: Secondary | ICD-10-CM | POA: Diagnosis not present

## 2020-06-11 DIAGNOSIS — E782 Mixed hyperlipidemia: Secondary | ICD-10-CM

## 2020-06-11 NOTE — Progress Notes (Signed)
Cardiology Office Note:    Date:  06/11/2020   ID:  Maria Ingram, DOB [birth year], MRN [id number]  PCP:  Maryella Shivers, MD  Cardiologist:  Jenne Campus, MD    Referring MD: Maryella Shivers, MD   Chief Complaint  Patient presents with   Follow-up  I am doing very well  History of Present Illness:    Maria Ingram is a 69 y.o. female with past medical history significant for mitral valve replacement with mechanical valve done couple years ago in [place], dyslipidemia, hypertension, prediabetes, anemia.  Cardiac wise doing well.  Denies have any chest pain tightness squeezing pressure burning chest.  She try to exercise on the regular basis and doing well.  Last time I seen her there was an issue with her being anemic colonoscopy has been planned however, it looks like it never happened.  She is doing well.  Apparently her H&H is stable.  I did review K PN as well as our record which showed last hemoglobin 11.3 over hematocrit 35.1.  She still on Coumadin with a target INR of 3.  Denies have any chest pain tightness squeezing pressure burning chest no palpitations no swelling of lower extremities  Past Medical History:  Diagnosis Date   Abnormal stress test 02/11/2018   Mid and apical portion of the anterior wall ischemia and stress test in July 2019 Cardiac catheterization after that showing normal coronaries   Anemia 11/10/2019   Borderline diabetes 02/08/2018   Dyslipidemia 02/08/2018   Essential hypertension 02/08/2018   GERD (gastroesophageal reflux disease)    H/O mitral valve replacement with mechanical valve 01/08/2015   Done in 2000, [facility]  Formatting of this note might be different from the original. Done in 2000, [facility]   History of mitral valve prosthesis 02/08/2018   Done many years ago in [place]   History of rheumatic heart disease    Hyperlipidemia    Hypertension    Osteoarthritis of left knee 09/13/2018   Pain in left knee 05/20/2018    Pre-diabetes    Borderline   Pre-operative clearance 02/08/2018   Psoriasis     Past Surgical History:  Procedure Laterality Date   LEFT HEART CATH AND CORONARY ANGIOGRAPHY N/A 02/16/2018   Procedure: LEFT HEART CATH AND CORONARY ANGIOGRAPHY;  Surgeon: Martinique, Peter M, MD;  Location: [facility];  Service: Cardiovascular;  Laterality: N/A;   VALVE REPLACEMENT     St Jude mitral valve prosthesis    Current Medications: Current Meds  Medication Sig   atorvastatin (LIPITOR) 10 MG tablet Take 10 mg by mouth daily.   citalopram (CELEXA) 10 MG tablet Take 10 mg by mouth daily.   dexlansoprazole (DEXILANT) 60 MG capsule Take 60 mg by mouth daily.   loratadine (CLARITIN) 10 MG tablet Take 10 mg by mouth daily.   memantine (NAMENDA) 5 MG tablet Take 5 mg by mouth 2 times daily.   triamcinolone cream (KENALOG) 0.5 % APPLY TO THE AFFECTED AREA(S) TWICE (2) DAILY AS NEEDED   warfarin (COUMADIN) 4 MG tablet Take 4 mg by mouth See admin instructions. Alternates taking 3 mg one day then 4 mg the next     Allergies:   Iodine   Social History   Socioeconomic History   Marital status: Married    Spouse name: Not on file   Number of children: Not on file   Years of education: Not on file   Highest education level: Not on file  Occupational History  Not on file  Tobacco Use   Smoking status: Never Smoker   Smokeless tobacco: Never Used  Vaping Use   Vaping Use: Never used  Substance and Sexual Activity   Alcohol use: Not Currently   Drug use: Never   Sexual activity: Not on file  Other Topics Concern   Not on file  Social History Narrative   Not on file   Social Determinants of Health   Financial Resource Strain:    Difficulty of Paying Living Expenses: Not on file  Food Insecurity:    Worried About [place] in the Last Year: Not on file   Ran Out of Food in the Last Year: Not on file  Transportation Needs:    Lack of Transportation  (Medical): Not on file   Lack of Transportation (Non-Medical): Not on file  Physical Activity:    Days of Exercise per Week: Not on file   Minutes of Exercise per Session: Not on file  Stress:    Feeling of Stress : Not on file  Social Connections:    Frequency of Communication with Friends and Family: Not on file   Frequency of Social Gatherings with Friends and Family: Not on file   Attends Religious Services: Not on file   Active Member of Clubs or Organizations: Not on file   Attends Archivist Meetings: Not on file   Marital Status: Not on file     Family History: The patient's family history includes Colon cancer in her father. ROS:   Please see the history of present illness.    All 14 point review of systems negative except as described per history of present illness  EKGs/Labs/Other Studies Reviewed:      Recent Labs: 10/31/2019: ALT 13 03/07/2020: BUN 20; Creatinine, Ser 0.80; Hemoglobin 11.3; NT-Pro BNP 154; Platelets 318; Potassium 4.4; Sodium 137  Recent Lipid Panel No results found for: CHOL, TRIG, HDL, CHOLHDL, VLDL, LDLCALC, LDLDIRECT  Physical Exam:    VS:  BP 140/64 (BP Location: Right Arm, Patient Position: Sitting, Cuff Size: Normal)    Pulse 72    Ht 4\' 11"  (1.499 m)    Wt 147 lb (66.7 kg)    SpO2 94%    BMI 29.69 kg/m     Wt Readings from Last 3 Encounters:  06/11/20 147 lb (66.7 kg)  03/07/20 151 lb 9.6 oz (68.8 kg)  12/06/19 151 lb 3.2 oz (68.6 kg)     GEN:  Well nourished, well developed in no acute distress HEENT: Normal NECK: No JVD; No carotid bruits LYMPHATICS: No lymphadenopathy CARDIAC: RRR, crisp mechanical valve sounds., no rubs, no gallops RESPIRATORY:  Clear to auscultation without rales, wheezing or rhonchi  ABDOMEN: Soft, non-tender, non-distended MUSCULOSKELETAL:  No edema; No deformity  SKIN: Warm and dry LOWER EXTREMITIES: no swelling NEUROLOGIC:  Alert and oriented x 3 PSYCHIATRIC:  Normal affect    ASSESSMENT:    1. H/O mitral valve replacement with mechanical valve   2. Mixed hyperlipidemia   3. Primary hypertension   4. Gastroesophageal reflux disease without esophagitis    PLAN:    In order of problems listed above:  1. History of mitral valve replacement with mechanical valve.  Valve functioning properly, sounds are crisp's, continue Coumadin with INR target of 3. 2. Mixed dyslipidemia she is on Lipitor 10 which I will continue.  I did review K PN which show me LDL 69 HDL 45, continue present management. 3. Essential hypertension blood pressure 140/64  today she is always good slightly high blood pressure when she is in the office but overall looks good. 4. Gastroesophageal reflux disease stable. 5. History of anemia: Colonoscopy has not been done.  Will call primary care physician to get latest fasting lipid profile she did tell me that was done just few weeks ago.  She was told everything is fine.   Medication Adjustments/Labs and Tests Ordered: Current medicines are reviewed at length with the patient today.  Concerns regarding medicines are outlined above.  No orders of the defined types were placed in this encounter.  Medication changes: No orders of the defined types were placed in this encounter.   Signed, Park Liter, MD, [facility] 06/11/2020 8:18 AM    [place]

## 2020-06-11 NOTE — Patient Instructions (Signed)

## 2020-07-16 ENCOUNTER — Other Ambulatory Visit: Payer: Self-pay | Admitting: Gastroenterology

## 2020-08-06 ENCOUNTER — Other Ambulatory Visit: Payer: Self-pay | Admitting: Gastroenterology

## 2020-10-24 ENCOUNTER — Other Ambulatory Visit: Payer: Self-pay | Admitting: Gastroenterology

## 2020-10-24 DIAGNOSIS — R197 Diarrhea, unspecified: Secondary | ICD-10-CM

## 2020-10-24 DIAGNOSIS — R109 Unspecified abdominal pain: Secondary | ICD-10-CM

## 2020-10-25 ENCOUNTER — Other Ambulatory Visit: Payer: Self-pay

## 2020-10-26 ENCOUNTER — Ambulatory Visit (INDEPENDENT_AMBULATORY_CARE_PROVIDER_SITE_OTHER): Payer: Medicaid Other | Admitting: Cardiology

## 2020-10-26 ENCOUNTER — Other Ambulatory Visit: Payer: Self-pay

## 2020-10-26 ENCOUNTER — Encounter: Payer: Self-pay | Admitting: Cardiology

## 2020-10-26 VITALS — BP 132/64 | HR 76 | Ht 64.0 in | Wt 144.4 lb

## 2020-10-26 DIAGNOSIS — R7303 Prediabetes: Secondary | ICD-10-CM | POA: Diagnosis not present

## 2020-10-26 DIAGNOSIS — E785 Hyperlipidemia, unspecified: Secondary | ICD-10-CM | POA: Diagnosis not present

## 2020-10-26 DIAGNOSIS — Z952 Presence of prosthetic heart valve: Secondary | ICD-10-CM | POA: Diagnosis not present

## 2020-10-26 DIAGNOSIS — I10 Essential (primary) hypertension: Secondary | ICD-10-CM | POA: Diagnosis not present

## 2020-10-26 NOTE — Progress Notes (Signed)
Cardiology Office Note:    Date:  10/26/2020   ID:  Maria Ingram, DOB May 15, 1951, MRN [id number]  PCP:  Maryella Shivers, MD  Cardiologist:  Jenne Campus, MD    Referring MD: Maryella Shivers, MD   No chief complaint on file. Doing fine have a little bit heartburn when I lay down in the meantime in the bed  History of Present Illness:    Maria Ingram is a 70 y.o. female with past medical history significant for mitral valve replacement with mechanical valve done couple years ago in [place], dyslipidemia, hypertension, prediabetes, diabetes.  Cardiac wise she seems to be doing well denies have any chest pain tightness squeezing pressure burning chest she looks good today she says she can do whatever she wants to do.  Last time there was an issue with her need to colonoscopy however it looks like it never happened.  She complained of having some heartburn.  She said when she lay down at evening time anytime she feels burning when she drinks a glass of water or when she sits up takes getting much better.  She does have proton pump inhibitor already on board.  I talked to her also about potentially taking Maalox or Mylanta.  She also complained of having cough when she wakes up in the morning which is probably related to GERD.  We did also talk potentially about having problem with postnasal drip.  Past Medical History:  Diagnosis Date  . Abnormal stress test 02/11/2018   Mid and apical portion of the anterior wall ischemia and stress test in July 2019 Cardiac catheterization after that showing normal coronaries  . Anemia 11/10/2019  . Borderline diabetes 02/08/2018  . Dyslipidemia 02/08/2018  . Essential hypertension 02/08/2018  . GERD (gastroesophageal reflux disease)   . H/O mitral valve replacement with mechanical valve 01/08/2015   Done in 2000, [facility]  Formatting of this note might be different from the original. Done in 2000, [facility]  . History of mitral valve  prosthesis 02/08/2018   Done many years ago in [place]  . History of rheumatic heart disease   . Hyperlipidemia   . Hypertension   . Osteoarthritis of left knee 09/13/2018  . Pain in left knee 05/20/2018  . Pre-diabetes    Borderline  . Pre-operative clearance 02/08/2018  . Psoriasis     Past Surgical History:  Procedure Laterality Date  . LEFT HEART CATH AND CORONARY ANGIOGRAPHY N/A 02/16/2018   Procedure: LEFT HEART CATH AND CORONARY ANGIOGRAPHY;  Surgeon: Martinique, Peter M, MD;  Location: [facility];  Service: Cardiovascular;  Laterality: N/A;  . VALVE REPLACEMENT     St Jude mitral valve prosthesis    Current Medications: Current Meds  Medication Sig  . atorvastatin (LIPITOR) 10 MG tablet Take 10 mg by mouth daily.  . citalopram (CELEXA) 10 MG tablet Take 10 mg by mouth daily.  Marland Kitchen dexlansoprazole (DEXILANT) 60 MG capsule Take 60 mg by mouth as needed (acid reflux).  Marland Kitchen lisinopril (ZESTRIL) 20 MG tablet Take 20 mg by mouth daily.  Marland Kitchen loratadine (CLARITIN) 10 MG tablet Take 10 mg by mouth daily.  . memantine (NAMENDA) 5 MG tablet Take 5 mg by mouth 2 times daily.  Marland Kitchen triamcinolone cream (KENALOG) 0.5 % Apply 1 application topically 2 (two) times daily as needed (psoraisis).  . warfarin (COUMADIN) 3 MG tablet Take 3 mg by mouth daily.  Marland Kitchen warfarin (COUMADIN) 4 MG tablet Take 4 mg by mouth See admin instructions.  Alternates taking 3 mg one day then 4 mg the next     Allergies:   Patient has no known allergies.   Social History   Socioeconomic History  . Marital status: Married    Spouse name: Not on file  . Number of children: Not on file  . Years of education: Not on file  . Highest education level: Not on file  Occupational History  . Not on file  Tobacco Use  . Smoking status: Never Smoker  . Smokeless tobacco: Never Used  Vaping Use  . Vaping Use: Never used  Substance and Sexual Activity  . Alcohol use: Not Currently  . Drug use: Never  . Sexual activity: Not on  file  Other Topics Concern  . Not on file  Social History Narrative  . Not on file   Social Determinants of Health   Financial Resource Strain: Not on file  Food Insecurity: Not on file  Transportation Needs: Not on file  Physical Activity: Not on file  Stress: Not on file  Social Connections: Not on file     Family History: The patient's family history includes Colon cancer in her father. ROS:   Please see the history of present illness.    All 14 point review of systems negative except as described per history of present illness  EKGs/Labs/Other Studies Reviewed:      Recent Labs: 10/31/2019: ALT 13 03/07/2020: BUN 20; Creatinine, Ser 0.80; Hemoglobin 11.3; NT-Pro BNP 154; Platelets 318; Potassium 4.4; Sodium 137  Recent Lipid Panel No results found for: CHOL, TRIG, HDL, CHOLHDL, VLDL, LDLCALC, LDLDIRECT  Physical Exam:    VS:  BP 132/64   Pulse 76   Ht 5\' 4"  (1.626 m)   Wt 144 lb 6.4 oz (65.5 kg)   SpO2 94%   BMI 24.79 kg/m     Wt Readings from Last 3 Encounters:  10/26/20 144 lb 6.4 oz (65.5 kg)  06/11/20 147 lb (66.7 kg)  03/07/20 151 lb 9.6 oz (68.8 kg)     GEN:  Well nourished, well developed in no acute distress HEENT: Normal NECK: No JVD; No carotid bruits LYMPHATICS: No lymphadenopathy CARDIAC: RRR, no murmurs, crisp mechanical valve sounds.  No rubs, no gallops RESPIRATORY:  Clear to auscultation without rales, wheezing or rhonchi  ABDOMEN: Soft, non-tender, non-distended MUSCULOSKELETAL:  No edema; No deformity  SKIN: Warm and dry LOWER EXTREMITIES: no swelling NEUROLOGIC:  Alert and oriented x 3 PSYCHIATRIC:  Normal affect   ASSESSMENT:    1. Essential hypertension   2. H/O mitral valve replacement with mechanical valve   3. Dyslipidemia   4. Borderline diabetes    PLAN:    In order of problems listed above:  1. Essential hypertension blood pressure well controlled we will continue present management. 2. Status post mitral valve  replacement.  Mechanical valve done in [place].  She is doing well from that point review.  Her next time I see her we will repeat echocardiogram. 3. Dyslipidemia, I do not have her latest cholesterol.  Will call primary care physician to get a copy of it. 4. Diabetes which is borderline we did talk about need to exercise and good diet which she hopefully will do. 5. Anticoagulation needs maintained with Coumadin.   Medication Adjustments/Labs and Tests Ordered: Current medicines are reviewed at length with the patient today.  Concerns regarding medicines are outlined above.  Orders Placed This Encounter  Procedures  . EKG 12-Lead   Medication changes: No orders  of the defined types were placed in this encounter.   Signed, Park Liter, MD, [facility] 10/26/2020 9:38 AM    [place]

## 2020-10-26 NOTE — Patient Instructions (Signed)

## 2021-02-05 ENCOUNTER — Telehealth: Payer: Self-pay

## 2021-02-05 NOTE — Telephone Encounter (Signed)
   Patient Name: Maria Ingram  DOB: [birth year]  MRN: [id number]   Primary Cardiologist: Jenne Campus, MD  Chart reviewed as part of pre-operative protocol coverage. Cataract extractions are recognized in guidelines as low risk surgeries that do not typically require specific preoperative testing or holding of blood thinner therapy. Therefore, given past medical history and time since last visit, based on ACC/AHA guidelines, Maria Ingram would be at acceptable risk for the planned procedure without further cardiovascular testing.   I will route this recommendation to the requesting party via Epic fax function and remove from pre-op pool.  Please call with questions.  Deberah Pelton, NP 02/05/2021, 3:21 PM

## 2021-02-05 NOTE — Telephone Encounter (Signed)
   [facility] Pre-operative Risk Assessment    Request for surgical clearance:  What type of surgery is being performed? Cataract Extraction Bilateral by PE    When is this surgery scheduled? 02/13/2021   What type of clearance is required (medical clearance vs. Pharmacy clearance to hold med vs. Both)? Both  Are there any medications that need to be held prior to surgery and how long?Warfarin holding length not specified    Practice name and name of physician performing surgery? Dr. Delena Serve [facility] at [facility]   What is your office phone number: [phone] ext [phone]    7.   What is your office fax number: [phone]  8.   Anesthesia type (None, local, MAC, general) ? IV Sedation   Maria Ingram Maria Ingram 02/05/2021, 2:43 PM  _________________________________________________________________   (provider comments below)

## 2021-04-24 LAB — COLOGUARD: COLOGUARD: POSITIVE — AB

## 2021-05-22 ENCOUNTER — Other Ambulatory Visit: Payer: Self-pay

## 2021-05-27 ENCOUNTER — Ambulatory Visit: Payer: Medicaid Other | Admitting: Neurology

## 2021-05-29 ENCOUNTER — Ambulatory Visit: Payer: Medicaid Other | Admitting: Cardiology

## 2021-06-25 ENCOUNTER — Other Ambulatory Visit: Payer: Self-pay

## 2021-06-25 ENCOUNTER — Encounter: Payer: Self-pay | Admitting: Neurology

## 2021-06-25 ENCOUNTER — Ambulatory Visit: Payer: Medicaid Other | Admitting: Neurology

## 2021-06-25 VITALS — BP 127/59 | HR 73 | Ht 64.0 in | Wt 142.5 lb

## 2021-06-25 DIAGNOSIS — G309 Alzheimer's disease, unspecified: Secondary | ICD-10-CM

## 2021-06-25 DIAGNOSIS — F02A0 Dementia in other diseases classified elsewhere, mild, without behavioral disturbance, psychotic disturbance, mood disturbance, and anxiety: Secondary | ICD-10-CM | POA: Diagnosis not present

## 2021-06-25 MED ORDER — MEMANTINE HCL 10 MG PO TABS: 10.0000 mg | ORAL_TABLET | Freq: Two times a day (BID) | ORAL | 4 refills | Status: DC

## 2021-06-25 MED ORDER — CITALOPRAM HYDROBROMIDE 20 MG PO TABS: 20.0000 mg | ORAL_TABLET | Freq: Every day | ORAL | 4 refills | Status: DC

## 2021-06-25 NOTE — Patient Instructions (Addendum)
Increase Celaxa to 20 mg daily  Increase Namenda to 10 mg twice a day  CT Brain without contrast (Has metallic valve)  Return in 1 year

## 2021-06-25 NOTE — Progress Notes (Signed)
[facility]  PATIENT: Maria Ingram DOB: 10/25/1950  REQUESTING CLINICIAN: Maryella Shivers, MD HISTORY FROM: Patient, and daughter via interpretor [place] VISIT: Memory decline    HISTORICAL  CHIEF COMPLAINT:  Chief Complaint  Patient presents with   New Patient (Initial Visit)    Rm 13. Accompanied by daughter. Pt c/o memory disorder.    HISTORY OF PRESENT ILLNESS:  This is a 70 year old woman with past medical history of mitral valve replacement, on Coumadin, hyperlipidemia, depression who is presenting with memory decline for the past 3 years.  Per daughter, patient has been having memory decline for the past 3 years described as confused, sometimes she is confused when going to the store, confused when leaving the house, does not know if she is leaving or coming back.  She also have issue with recent conversations.  She has been started on Namenda 5 mg twice daily without much improvement per daughter.  She is able to bathe herself, clean herself, she can ambulate without a cane, denies any recent fall.  She did burn a lot of things when cooking, of sometimes forget things in the microwave.  Per daughter there is a lot of instances where she will tell patient something and the next thing patient will repeat that she was never told about it.  She does not have issue with family members name. Per patient she recalled that her memory is not very good, that a lot of thing that her daughter tells her, she will forget.  She reports mild issue with sleep, take a long time to go to sleep and sometimes she has nightmares. She also reports being easily distracted.  Because of her memory and being in [place] she had issues with sadness and depression.  Currently she is on Celexa 10 mg daily.    OTHER MEDICAL CONDITIONS: Mitral valve replacement on coumadin, HLD, Depression    REVIEW OF SYSTEMS: Full 14 system review of systems performed and negative  with exception of: as noted in the HPI.   ALLERGIES: No Known Allergies  HOME MEDICATIONS: Outpatient Medications Prior to Visit  Medication Sig Dispense Refill   atorvastatin (LIPITOR) 10 MG tablet Take 10 mg by mouth daily.     dexlansoprazole (DEXILANT) 60 MG capsule Take 60 mg by mouth as needed (acid reflux).     lisinopril (ZESTRIL) 20 MG tablet Take 20 mg by mouth daily.     loratadine (CLARITIN) 10 MG tablet Take 10 mg by mouth daily.     triamcinolone cream (KENALOG) 0.5 % Apply 1 application topically 2 (two) times daily as needed (psoraisis).     warfarin (COUMADIN) 3 MG tablet Take 3 mg by mouth daily.     warfarin (COUMADIN) 4 MG tablet Take 4 mg by mouth See admin instructions. Alternates taking 3 mg one day then 4 mg the next     citalopram (CELEXA) 10 MG tablet Take 10 mg by mouth daily.     memantine (NAMENDA) 5 MG tablet Take 5 mg by mouth 2 times daily.     No facility-administered medications prior to visit.    PAST MEDICAL HISTORY: Past Medical History:  Diagnosis Date   Abnormal stress test 02/11/2018   Mid and apical portion of the anterior wall ischemia and stress test in July 2019 Cardiac catheterization after that showing normal coronaries   Anemia 11/10/2019   Borderline diabetes 02/08/2018   Dyslipidemia 02/08/2018   Essential hypertension 02/08/2018   GERD (gastroesophageal reflux  disease)    H/O mitral valve replacement with mechanical valve 01/08/2015   Done in 2000, [facility]  Formatting of this note might be different from the original. Done in [facility], [facility]   History of mitral valve prosthesis 02/08/2018   Done many years ago in [place]   History of rheumatic heart disease    Hyperlipidemia    Hypertension    Osteoarthritis of left knee 09/13/2018   Pain in left knee 05/20/2018   Pre-diabetes    Borderline   Pre-operative clearance 02/08/2018   Psoriasis     PAST SURGICAL HISTORY: Past Surgical History:  Procedure Laterality Date   LEFT HEART CATH AND  CORONARY ANGIOGRAPHY N/A 02/16/2018   Procedure: LEFT HEART CATH AND CORONARY ANGIOGRAPHY;  Surgeon: Martinique, Peter M, MD;  Location: [facility];  Service: Cardiovascular;  Laterality: N/A;   VALVE REPLACEMENT     St Jude mitral valve prosthesis    FAMILY HISTORY: Family History  Problem Relation Age of Onset   Colon cancer Father     SOCIAL HISTORY: Social History   Socioeconomic History   Marital status: Married    Spouse name: Not on file   Number of children: Not on file   Years of education: Not on file   Highest education level: Not on file  Occupational History   Not on file  Tobacco Use   Smoking status: Never   Smokeless tobacco: Never  Vaping Use   Vaping Use: Never used  Substance and Sexual Activity   Alcohol use: Not Currently   Drug use: Never   Sexual activity: Not on file  Other Topics Concern   Not on file  Social History Narrative   Not on file   Social Determinants of Health   Financial Resource Strain: Not on file  Food Insecurity: Not on file  Transportation Needs: Not on file  Physical Activity: Not on file  Stress: Not on file  Social Connections: Not on file  Intimate Partner Violence: Not on file    PHYSICAL EXAM  GENERAL EXAM/CONSTITUTIONAL: Vitals:  Vitals:   06/25/21 0807  BP: (!) 127/59  Pulse: 73  Weight: 142 lb 8 oz (64.6 kg)  Height: 5\' 4"  (1.626 m)   Body mass index is 24.46 kg/m. Wt Readings from Last 3 Encounters:  06/25/21 142 lb 8 oz (64.6 kg)  10/26/20 144 lb 6.4 oz (65.5 kg)  06/11/20 147 lb (66.7 kg)   Patient is in no distress; well developed, nourished and groomed; neck is supple  CARDIOVASCULAR: Examination of carotid arteries is normal; no carotid bruits Regular rate and rhythm, no murmurs Examination of peripheral vascular system by observation and palpation is normal  EYES: Pupils round and reactive to light, Visual fields full to confrontation, Extraocular movements intacts,    MUSCULOSKELETAL: Gait, strength, tone, movements noted in Neurologic exam below  NEUROLOGIC: MENTAL STATUS:  MMSE - [place] Exam 06/25/2021  Orientation to time 1  Orientation to Place 1  Registration 3  Attention/ Calculation 0  Recall 2  Language- name 2 objects 2  Language- repeat 0  Language- repeat-comments no spanish translation available  Language- follow 3 step command 3  Language- read & follow direction 1  Write a sentence 1  Copy design 0  Total score 14   awake, alert, oriented to person, place and time  CRANIAL NERVE:  2nd, 3rd, 4th, 6th - pupils equal and reactive to light, visual fields full to confrontation, extraocular  muscles intact, no nystagmus 5th - facial sensation symmetric 7th - facial strength symmetric 8th - hearing intact 9th - palate elevates symmetrically, uvula midline 11th - shoulder shrug symmetric 12th - tongue protrusion midline  MOTOR:  normal bulk and tone, full strength in the BUE, BLE  SENSORY:  normal and symmetric to light touch, pinprick, temperature, vibration  COORDINATION:  finger-nose-finger, fine finger movements normal  REFLEXES:  deep tendon reflexes present and symmetric  GAIT/STATION:  normal    DIAGNOSTIC DATA (LABS, IMAGING, TESTING) - I reviewed patient records, labs, notes, testing and imaging myself where available.  Lab Results  Component Value Date   WBC 7.6 03/07/2020   HGB 11.3 03/07/2020   HCT 35.1 03/07/2020   MCV 93 03/07/2020   PLT 318 03/07/2020      Component Value Date/Time   NA 137 03/07/2020 0828   K 4.4 03/07/2020 0828   CL 101 03/07/2020 0828   CO2 22 03/07/2020 0828   GLUCOSE 134 (H) 03/07/2020 0828   GLUCOSE 86 10/31/2019 1536   BUN 20 03/07/2020 0828   CREATININE 0.80 03/07/2020 0828   CALCIUM 9.1 03/07/2020 0828   PROT 7.3 10/31/2019 1536   ALBUMIN 4.0 10/31/2019 1536   AST 16 10/31/2019 1536   ALT 13 10/31/2019 1536   ALKPHOS 69 10/31/2019 1536   BILITOT  0.4 10/31/2019 1536   GFRNONAA 74 03/07/2020 0828   GFRAA 86 03/07/2020 0828   No results found for: CHOL, HDL, LDLCALC, LDLDIRECT, TRIG, CHOLHDL No results found for: HGBA1C Lab Results  Component Value Date   VITAMINB12 320 11/10/2019   No results found for: TSH     ASSESSMENT AND PLAN  70 y.o. year old female with past medical history of aortic valve replacement, on Coumadin, hyperlipidemia and depression who is presenting with memory decline for the past 3 years.  Memory declined described as being forgetful about recent conversation, being easily distracted, she will easily lose her train of thought.  She had burned pots while cooking and currently does not drive.  Daughter takes her wherever she needs to go.  She does not have issue with family members name.  Daughter also handles all of her finances.  She was started on Namenda 5 mg twice daily, I will increase it to 10 mg twice daily.  She also reported instances of sadness and depressed mood, she is currently on Celexa 10 mg daily, I will increase it to 20 mg daily.  I will also obtain a Vitamin B12 level and a TSH. She has a metallic  valve, I will obtain a head CT to rule out any intracranial abnormalities. I will contact the patient to go over the result, otherwise I will see her in 1 year for follow-up.   1. 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 Alzheimer's disease, unspecified (CODE) ([place])     PLAN: Increase Celaxa to 20 mg daily  Increase Namenda to 10 mg twice a day  CT Brain without contrast (Has metallic valve)  Return in 1 year   Orders Placed This Encounter  Procedures   CT HEAD WO CONTRAST (5MM)   TSH   Vitamin B12     Meds ordered this encounter  Medications   memantine (NAMENDA) 10 MG tablet    Sig: Take 1 tablet (10 mg total) by mouth 2 (two) times daily.    Dispense:  180 tablet  Refill:  4   citalopram (CELEXA) 20 MG tablet    Sig: Take 1 tablet (20 mg total) by mouth daily.    Dispense:  90 tablet    Refill:  4     Return in about 1 year (around 06/25/2022).    Alric Ran, MD 06/25/2021, 9:23 PM  [facility] [address] [place], [place] [address] [phone]

## 2021-06-26 ENCOUNTER — Telehealth: Payer: Self-pay | Admitting: *Deleted

## 2021-06-26 LAB — VITAMIN B12: Vitamin B-12: 554 pg/mL (ref 232–1245)

## 2021-06-26 LAB — TSH: TSH: 1.96 u[IU]/mL (ref 0.450–4.500)

## 2021-06-26 NOTE — Telephone Encounter (Signed)
The patient was contacted through interpreter services and provided with her lab results.

## 2021-06-26 NOTE — Telephone Encounter (Signed)
-----   Message from Alric Ran, MD sent at 06/26/2021  8:34 AM EST ----- Please call and advise the patient that the recent labs we checked were within normal limits. We checked vitamin B12 level, and thyroid function. No further action is required on these tests at this time. Please remind patient to keep any upcoming appointments or tests and to call us with any interim questions, concerns, problems or updates. Thanks,   Alric Ran, MD

## 2021-06-26 NOTE — Progress Notes (Signed)
Please call and advise the patient that the recent labs we checked were within normal limits. We checked vitamin B12 level, and thyroid function. No further action is required on these tests at this time. Please remind patient to keep any upcoming appointments or tests and to call us with any interim questions, concerns, problems or updates. Thanks,   Wylma Tatem, MD  

## 2021-06-26 NOTE — Telephone Encounter (Signed)
Patient will need to be called through interpreter services (spanish).

## 2021-07-03 ENCOUNTER — Telehealth: Payer: Self-pay | Admitting: Gastroenterology

## 2021-07-03 NOTE — Telephone Encounter (Signed)
Spanish Interpreter Used for the conversation Pt stated that she had a Positive Cologuard test In September After reviewing pt chart: the pt is noted to be  scheduled for an office visit on 08/15/2021 with Dr. Agustin Cree her cardiologist for cardiac clearance for the Colonoscopy. Pt was notified of this and  after that appointment then she could call and we could schedule the Colonoscopy if she did received the cardiac clearance.  Pt verbalized understanding with all questions answered.

## 2021-07-17 ENCOUNTER — Encounter: Payer: Self-pay | Admitting: Cardiology

## 2021-07-17 ENCOUNTER — Other Ambulatory Visit: Payer: Self-pay

## 2021-07-17 ENCOUNTER — Ambulatory Visit (INDEPENDENT_AMBULATORY_CARE_PROVIDER_SITE_OTHER): Payer: Medicaid Other | Admitting: Cardiology

## 2021-07-17 VITALS — BP 152/60 | HR 75 | Ht 59.0 in | Wt 140.8 lb

## 2021-07-17 DIAGNOSIS — Z952 Presence of prosthetic heart valve: Secondary | ICD-10-CM | POA: Diagnosis not present

## 2021-07-17 DIAGNOSIS — R7303 Prediabetes: Secondary | ICD-10-CM | POA: Diagnosis not present

## 2021-07-17 DIAGNOSIS — E785 Hyperlipidemia, unspecified: Secondary | ICD-10-CM

## 2021-07-17 DIAGNOSIS — I10 Essential (primary) hypertension: Secondary | ICD-10-CM | POA: Diagnosis not present

## 2021-07-17 NOTE — Patient Instructions (Signed)
Medication Instructions:  Your physician recommends that you continue on your current medications as directed. Please refer to the Current Medication list given to you today.  *If you need a refill on your cardiac medications before your next appointment, please call your pharmacy*   Lab Work: None If you have labs (blood work) drawn today and your tests are completely normal, you will receive your results only by: Echo (if you have MyChart) OR A paper copy in the mail If you have any lab test that is abnormal or we need to change your treatment, we will call you to review the results.   Testing/Procedures: Your physician has requested that you have an echocardiogram. Echocardiography is a painless test that uses sound waves to create images of your heart. It provides your doctor with information about the size and shape of your heart and how well your hearts chambers and valves are working. This procedure takes approximately one hour. There are no restrictions for this procedure.    Follow-Up: At [facility], you and your health needs are our priority.  As part of our continuing mission to provide you with exceptional heart care, we have created designated Provider Care Teams.  These Care Teams include your primary Cardiologist (physician) and Advanced Practice Providers (APPs -  Physician Assistants and Nurse Practitioners) who all work together to provide you with the care you need, when you need it.  We recommend signing up for the patient portal called "MyChart".  Sign up information is provided on this After Visit Summary.  MyChart is used to connect with patients for Virtual Visits (Telemedicine).  Patients are able to view lab/test results, encounter notes, upcoming appointments, etc.  Non-urgent messages can be sent to your provider as well.   To learn more about what you can do with MyChart, go to [email or web address].    Your next appointment:   5  month(s)  The format for your next appointment:   In Person  Provider:   Jenne Campus, MD    Other Instructions  Echocardiogram An echocardiogram is a test that uses sound waves (ultrasound) to produce images of the heart. Images from an echocardiogram can provide important information about: Heart size and shape. The size and thickness and movement of your heart's walls. Heart muscle function and strength. Heart valve function or if you have stenosis. Stenosis is when the heart valves are too narrow. If blood is flowing backward through the heart valves (regurgitation). A tumor or infectious growth around the heart valves. Areas of heart muscle that are not working well because of poor blood flow or injury from a heart attack. Aneurysm detection. An aneurysm is a weak or damaged part of an artery wall. The wall bulges out from the normal force of blood pumping through the body. Tell a health care provider about: Any allergies you have. All medicines you are taking, including vitamins, herbs, eye drops, creams, and over-the-counter medicines. Any blood disorders you have. Any surgeries you have had. Any medical conditions you have. Whether you are pregnant or may be pregnant. What are the risks? Generally, this is a safe test. However, problems may occur, including an allergic reaction to dye (contrast) that may be used during the test. What happens before the test? No specific preparation is needed. You may eat and drink normally. What happens during the test?  You will take off your clothes from the waist up and put on a hospital gown. Electrodes or electrocardiogram (ECG)patches may be  placed on your chest. The electrodes or patches are then connected to a device that monitors your heart rate and rhythm. You will lie down on a table for an ultrasound exam. A gel will be applied to your chest to help sound waves pass through your skin. A handheld device, called a  transducer, will be pressed against your chest and moved over your heart. The transducer produces sound waves that travel to your heart and bounce back (or "echo" back) to the transducer. These sound waves will be captured in real-time and changed into images of your heart that can be viewed on a video monitor. The images will be recorded on a computer and reviewed by your health care provider. You may be asked to change positions or hold your breath for a short time. This makes it easier to get different views or better views of your heart. In some cases, you may receive contrast through an IV in one of your veins. This can improve the quality of the pictures from your heart. The procedure may vary among health care providers and hospitals. What can I expect after the test? You may return to your normal, everyday life, including diet, activities, and medicines, unless your health care provider tells you not to do that. Follow these instructions at home: It is up to you to get the results of your test. Ask your health care provider, or the department that is doing the test, when your results will be ready. Keep all follow-up visits. This is important. Summary An echocardiogram is a test that uses sound waves (ultrasound) to produce images of the heart. Images from an echocardiogram can provide important information about the size and shape of your heart, heart muscle function, heart valve function, and other possible heart problems. You do not need to do anything to prepare before this test. You may eat and drink normally. After the echocardiogram is completed, you may return to your normal, everyday life, unless your health care provider tells you not to do that. This information is not intended to replace advice given to you by your health care provider. Make sure you discuss any questions you have with your health care provider. Document Revised: 04/03/2021 Document Reviewed: 03/13/2020 Elsevier  Patient Education  2022 [employer].

## 2021-07-17 NOTE — Progress Notes (Signed)
Cardiology Office Note:    Date:  07/17/2021   ID:  Maria Ingram, DOB 09/28/1950, MRN [id number]  PCP:  Maryella Shivers, MD  Cardiologist:  Jenne Campus, MD    Referring MD: Maryella Shivers, MD   Chief Complaint  Patient presents with   Clearance TBD    Colonoscopy Dr. [name]    History of Present Illness:    Maria Ingram is a 70 y.o. female  with past medical history significant for mitral valve replacement with mechanical valve done couple years ago in [place], dyslipidemia, hypertension, prediabetes, diabetes. He is coming today to my office for follow-up.  Likely cardiac wise she seems to be doing well.  She denies have any chest pain, tightness, pressure, burning in the chest no palpitations overall doing well problems she does have some GI symptoms recently she did Cologuard which was positive and she is absolutely scared about it.  Previously there was some conversation about potentially doing colonoscopy however eventually that idea has being abandoned now with her being so scared of it as well as the fact that she does have positive stool guaiac we may reconsider.  From my, cardiac point of view I will ask her to have an echocardiogram done however I think colonoscopy will be reasonable to perform.  Of course the question will be about anticoagulation.  This is mitral valve prosthesis which is overall highly thrombogenic.  Therefore I prefer not to discontinue her Coumadin or if we need to do colonoscopy may be bridging will be necessary or may be simply lowering the dose of Coumadin and bringing her INR closer to 2 before doing colonoscopy.  I will talk to her GI specialist about that.  Past Medical History:  Diagnosis Date   Abnormal stress test 02/11/2018   Mid and apical portion of the anterior wall ischemia and stress test in July 2019 Cardiac catheterization after that showing normal coronaries   Anemia 11/10/2019   Borderline diabetes 02/08/2018    Dyslipidemia 02/08/2018   Essential hypertension 02/08/2018   GERD (gastroesophageal reflux disease)    H/O mitral valve replacement with mechanical valve 01/08/2015   Done in 2000, [facility]  Formatting of this note might be different from the original. Done in 2000, [facility]   History of mitral valve prosthesis 02/08/2018   Done many years ago in [place]   History of rheumatic heart disease    Hyperlipidemia    Hypertension    Osteoarthritis of left knee 09/13/2018   Pain in left knee 05/20/2018   Pre-diabetes    Borderline   Pre-operative clearance 02/08/2018   Psoriasis     Past Surgical History:  Procedure Laterality Date   LEFT HEART CATH AND CORONARY ANGIOGRAPHY N/A 02/16/2018   Procedure: LEFT HEART CATH AND CORONARY ANGIOGRAPHY;  Surgeon: Martinique, Peter M, MD;  Location: [facility];  Service: Cardiovascular;  Laterality: N/A;   VALVE REPLACEMENT     St Jude mitral valve prosthesis    Current Medications: Current Meds  Medication Sig   atorvastatin (LIPITOR) 10 MG tablet Take 10 mg by mouth daily.   Calcipotriene-Betameth Diprop 0.005-0.064 % CREA Apply 1 application topically 2 (two) times a week.   citalopram (CELEXA) 20 MG tablet Take 1 tablet (20 mg total) by mouth daily.   dexlansoprazole (DEXILANT) 60 MG capsule Take 60 mg by mouth as needed (acid reflux).   lisinopril (ZESTRIL) 20 MG tablet Take 20 mg by mouth daily.   loratadine (CLARITIN) 10 MG tablet  Take 10 mg by mouth daily.   memantine (NAMENDA) 10 MG tablet Take 1 tablet (10 mg total) by mouth 2 (two) times daily.   SKYRIZI 150 MG/ML SOSY Inject 150 mg as directed See admin instructions. Every 12 weeks   triamcinolone cream (KENALOG) 0.5 % Apply 1 application topically 2 (two) times daily as needed (psoraisis).   warfarin (COUMADIN) 3 MG tablet Take 3 mg by mouth daily.   warfarin (COUMADIN) 4 MG tablet Take 4 mg by mouth See admin instructions. Alternates taking 3 mg one day then 4 mg the next     Allergies:    Patient has no known allergies.   Social History   Socioeconomic History   Marital status: Married    Spouse name: Not on file   Number of children: Not on file   Years of education: Not on file   Highest education level: Not on file  Occupational History   Not on file  Tobacco Use   Smoking status: Never   Smokeless tobacco: Never  Vaping Use   Vaping Use: Never used  Substance and Sexual Activity   Alcohol use: Not Currently   Drug use: Never   Sexual activity: Not on file  Other Topics Concern   Not on file  Social History Narrative   Not on file   Social Determinants of Health   Financial Resource Strain: Not on file  Food Insecurity: Not on file  Transportation Needs: Not on file  Physical Activity: Not on file  Stress: Not on file  Social Connections: Not on file     Family History: The patient's family history includes Colon cancer in her father. ROS:   Please see the history of present illness.    All 14 point review of systems negative except as described per history of present illness  EKGs/Labs/Other Studies Reviewed:      Recent Labs: 06/25/2021: TSH 1.960  Recent Lipid Panel No results found for: CHOL, TRIG, HDL, CHOLHDL, VLDL, LDLCALC, LDLDIRECT  Physical Exam:    VS:  BP (!) 152/60 (BP Location: Left Arm, Patient Position: Sitting)    Pulse 75    Ht 4\' 11"  (1.499 m)    Wt 140 lb 12.8 oz (63.9 kg)    SpO2 95%    BMI 28.44 kg/m     Wt Readings from Last 3 Encounters:  07/17/21 140 lb 12.8 oz (63.9 kg)  06/25/21 142 lb 8 oz (64.6 kg)  10/26/20 144 lb 6.4 oz (65.5 kg)     GEN:  Well nourished, well developed in no acute distress HEENT: Normal NECK: No JVD; No carotid bruits LYMPHATICS: No lymphadenopathy CARDIAC: RRR, no murmurs, no rubs, no gallops, crisp mechanical valve sounds RESPIRATORY:  Clear to auscultation without rales, wheezing or rhonchi  ABDOMEN: Soft, non-tender, non-distended MUSCULOSKELETAL:  No edema; No deformity   SKIN: Warm and dry LOWER EXTREMITIES: no swelling NEUROLOGIC:  Alert and oriented x 3 PSYCHIATRIC:  Normal affect   ASSESSMENT:    1. H/O mitral valve replacement with mechanical valve   2. Borderline diabetes   3. Dyslipidemia   4. Essential hypertension    PLAN:    In order of problems listed above:  Status post mitral valve replacement with mechanical prosthesis done in [place] years ago.  I will schedule her to have echocardiogram to assess the function of the valve but overall clinically she is doing well. Borderline diabetes that being followed by internal medicine team.  I did review K  PN which show me her hemoglobin A1c of 6.2 this is from 03/22/2021 which is quite good.  Continue present management. Dyslipidemia she did not have coronary artery disease at the time of her surgery, her LDL is now is 82 HDL 55 this data from K PN from 03/22/2021. Essential hypertension like always when she is in my office level is elevated but at home always good.  She clearly gets whitecoat hypertension.  We will continue monitoring the situation. Need for colonoscopy.  She does have positive Cologuard she is very scared about this entire situation she is aware she may have some problem I think colonoscopy will be required.  From my standpoint reviewed from cardiac point of view should be fine to perform procedure I will verify her echocardiogram if echocardiogram is fine should be no problem issue of course is anticoagulation I favor her to be on anticoagulation without any interruption but if GI doctor will think that there is a need to interrupt her anticoagulation and she most likely required bridging.   Medication Adjustments/Labs and Tests Ordered: Current medicines are reviewed at length with the patient today.  Concerns regarding medicines are outlined above.  No orders of the defined types were placed in this encounter.  Medication changes: No orders of the defined types were placed in this  encounter.   Signed, Park Liter, MD, [facility] 07/17/2021 2:02 PM    [place]

## 2021-07-19 ENCOUNTER — Other Ambulatory Visit: Payer: Medicaid Other

## 2021-07-31 ENCOUNTER — Ambulatory Visit
Admission: RE | Admit: 2021-07-31 | Discharge: 2021-07-31 | Disposition: A | Discharge status: Home / Self Care | Payer: Medicaid Other | Source: Ambulatory Visit | Attending: Neurology | Admitting: Neurology

## 2021-07-31 DIAGNOSIS — G309 Alzheimer's disease, unspecified: Secondary | ICD-10-CM | POA: Diagnosis not present

## 2021-08-06 ENCOUNTER — Ambulatory Visit (INDEPENDENT_AMBULATORY_CARE_PROVIDER_SITE_OTHER): Payer: Medicaid Other

## 2021-08-06 ENCOUNTER — Other Ambulatory Visit: Payer: Self-pay

## 2021-08-06 ENCOUNTER — Telehealth: Payer: Self-pay | Admitting: Gastroenterology

## 2021-08-06 DIAGNOSIS — E785 Hyperlipidemia, unspecified: Secondary | ICD-10-CM | POA: Diagnosis not present

## 2021-08-06 DIAGNOSIS — Z952 Presence of prosthetic heart valve: Secondary | ICD-10-CM

## 2021-08-06 DIAGNOSIS — R7303 Prediabetes: Secondary | ICD-10-CM

## 2021-08-06 DIAGNOSIS — I10 Essential (primary) hypertension: Secondary | ICD-10-CM | POA: Diagnosis not present

## 2021-08-06 LAB — ECHOCARDIOGRAM COMPLETE
Area-P 1/2: 2.72 cm2
Calc EF: 39.8 %
MV VTI: 1.24 cm2
S' Lateral: 3.9 cm
Single Plane A2C EF: 41.9 %
Single Plane A4C EF: 40.6 %

## 2021-08-06 NOTE — Telephone Encounter (Signed)
Pt's daughter Raynelle Chary called to inform that pt has EKG today with her cardiologist. They had not received results yet but cardiologist told pt that he was going to speak with Dr. [name] about EKG results. Pt's daughter would like a call back as soon as that happens because she would like to schedule pt's colonoscopy asap.

## 2021-08-07 NOTE — Telephone Encounter (Signed)
Pt daughter Raynelle Chary Notified that as soon as the Cardiologist contacts our office we will Contact pt to scheduled Colonoscopy. Jakelin verbalized understanding with all questions answered.

## 2021-08-13 ENCOUNTER — Telehealth: Payer: Self-pay | Admitting: Cardiology

## 2021-08-13 NOTE — Telephone Encounter (Signed)
° °  Pre-operative Risk Assessment    Patient Name: Maria Ingram  DOB: April 18, 1951 MRN: [id number]      Request for Surgical Clearance    Procedure:  Colonoscopy  Date of Surgery:  Clearance TBD                                 Surgeon:  Dr. [name] Surgeon's Group or Practice Name:  [facility] Phone number:  [phone] Fax number:  [phone]   Type of Clearance Requested:   - Medical    Type of Anesthesia:  General    Additional requests/questions:  Granddaughter wants the patient to have this done asap  Rosalyn Gess   08/13/2021, 12:21 PM

## 2021-08-13 NOTE — Telephone Encounter (Signed)
° °  Primary Cardiologist: Jenne Campus, MD  Chart reviewed as part of pre-operative protocol coverage. Given past medical history and time since last visit, based on ACC/AHA guidelines, Maria Ingram would be at acceptable risk for the planned procedure without further cardiovascular testing.    I will route this recommendation to the requesting party via Epic fax function and remove from pre-op pool.  Please call with questions.  Jossie Ng. Shateria Paternostro NP-C    08/13/2021, 12:32 PM [address] Office [phone] Fax [phone]

## 2021-08-15 ENCOUNTER — Ambulatory Visit: Payer: Medicaid Other | Admitting: Cardiology

## 2021-08-15 NOTE — Progress Notes (Signed)
Spoke with daughter via interpreter Jarrett Soho with [employer] [phone], discussed CT head findings showing 28 to 17 mm rounded mass with peripheral calcification near the right sphenoid wing, this could be a calcified meningioma or a large calcified aneurysm.  I have recommended doing a CT angiogram of the head for further characterization.  Daughter reported that mother tested positive for a colon cancer screening and she is pending colonoscopy.  They want to wait until completion of the colonoscopy, then will reach out to me to see if they want to move forward with additional test.  I provided the office phone number to call me when they have decided.

## 2021-08-23 ENCOUNTER — Other Ambulatory Visit: Payer: Self-pay

## 2021-08-23 ENCOUNTER — Encounter: Payer: Self-pay | Admitting: Cardiology

## 2021-08-23 ENCOUNTER — Ambulatory Visit (INDEPENDENT_AMBULATORY_CARE_PROVIDER_SITE_OTHER): Payer: Medicaid Other | Admitting: Cardiology

## 2021-08-23 VITALS — BP 134/78 | HR 68 | Ht 63.0 in | Wt 143.0 lb

## 2021-08-23 DIAGNOSIS — I10 Essential (primary) hypertension: Secondary | ICD-10-CM

## 2021-08-23 DIAGNOSIS — E782 Mixed hyperlipidemia: Secondary | ICD-10-CM

## 2021-08-23 DIAGNOSIS — Z952 Presence of prosthetic heart valve: Secondary | ICD-10-CM | POA: Diagnosis not present

## 2021-08-23 NOTE — Progress Notes (Signed)
Cardiology Office Note:    Date:  08/23/2021   ID:  Maria Ingram, DOB April 23, 1951, MRN [id number]  PCP:  Maria Shivers, MD  Cardiologist:  Jenne Campus, MD    Referring MD: Maria Shivers, MD   Chief Complaint  Patient presents with   Results    History of Present Illness:    Maria Ingram is a 71 y.o. female  female  with past medical history significant for mitral valve replacement with mechanical valve done couple years ago in [place], dyslipidemia, hypertension, prediabetes, diabetes. He is coming today to my office for follow-up.  Likely cardiac wise she seems to be doing well.  She denies have any chest pain, tightness, pressure, burning in the chest no palpitations overall doing well problems she does have some GI symptoms recently she did Cologuard which was positive and she is absolutely scared about it.  Previously there was some conversation about potentially doing colonoscopy however eventually that idea has being abandoned now with her being so scared of it as well as the fact that she does have positive stool guaiac we may reconsider.  From my, cardiac point of view I will ask her to have an echocardiogram done however I think colonoscopy will be reasonable to perform.  Of course the question will be about anticoagulation.  This is mitral valve prosthesis which is overall highly thrombogenic.  Therefore I prefer not to discontinue her Coumadin or if we need to do colonoscopy may be bridging will be necessary or may be simply lowering the dose of Coumadin and bringing her INR closer to 2 before doing colonoscopy.  I will talk to her GI specialist about that.  He She comes today to my office for follow-up.  She is [name] upset about some scheduling issue.  Likely that being clarified.  She did have echocardiogram which I did review should be fine to proceed with colonoscopy from my standpoint reviewed  Past Medical History:  Diagnosis Date   Abnormal  stress test 02/11/2018   Mid and apical portion of the anterior wall ischemia and stress test in July 2019 Cardiac catheterization after that showing normal coronaries   Anemia 11/10/2019   Borderline diabetes 02/08/2018   Dyslipidemia 02/08/2018   Essential hypertension 02/08/2018   GERD (gastroesophageal reflux disease)    H/O mitral valve replacement with mechanical valve 01/08/2015   Done in 2000, [facility]  Formatting of this note might be different from the original. Done in 2000, [facility]   History of mitral valve prosthesis 02/08/2018   Done many years ago in [place]   History of rheumatic heart disease    Hyperlipidemia    Hypertension    Osteoarthritis of left knee 09/13/2018   Pain in left knee 05/20/2018   Pre-diabetes    Borderline   Pre-operative clearance 02/08/2018   Psoriasis     Past Surgical History:  Procedure Laterality Date   LEFT HEART CATH AND CORONARY ANGIOGRAPHY N/A 02/16/2018   Procedure: LEFT HEART CATH AND CORONARY ANGIOGRAPHY;  Surgeon: Martinique, Peter M, MD;  Location: [facility];  Service: Cardiovascular;  Laterality: N/A;   VALVE REPLACEMENT     St Jude mitral valve prosthesis    Current Medications: Current Meds  Medication Sig   atorvastatin (LIPITOR) 10 MG tablet Take 10 mg by mouth daily.   Calcipotriene-Betameth Diprop 0.005-0.064 % CREA Apply 1 application topically 2 (two) times a week.   citalopram (CELEXA) 20 MG tablet Take 1 tablet (20 mg total)  by mouth daily.   dexlansoprazole (DEXILANT) 60 MG capsule Take 60 mg by mouth as needed (acid reflux).   lisinopril (ZESTRIL) 20 MG tablet Take 20 mg by mouth daily.   loratadine (CLARITIN) 10 MG tablet Take 10 mg by mouth daily.   memantine (NAMENDA) 10 MG tablet Take 1 tablet (10 mg total) by mouth 2 (two) times daily.   SKYRIZI 150 MG/ML SOSY Inject 150 mg as directed See admin instructions. Every 12 weeks   triamcinolone cream (KENALOG) 0.5 % Apply 1 application topically 2 (two) times daily as needed  (psoraisis).   warfarin (COUMADIN) 3 MG tablet Take 3 mg by mouth daily.   warfarin (COUMADIN) 4 MG tablet Take 4 mg by mouth See admin instructions. Alternates taking 3 mg one day then 4 mg the next     Allergies:   Patient has no known allergies.   Social History   Socioeconomic History   Marital status: Married    Spouse name: Not on file   Number of children: Not on file   Years of education: Not on file   Highest education level: Not on file  Occupational History   Not on file  Tobacco Use   Smoking status: Never   Smokeless tobacco: Never  Vaping Use   Vaping Use: Never used  Substance and Sexual Activity   Alcohol use: Not Currently   Drug use: Never   Sexual activity: Not on file  Other Topics Concern   Not on file  Social History Narrative   Not on file   Social Determinants of Health   Financial Resource Strain: Not on file  Food Insecurity: Not on file  Transportation Needs: Not on file  Physical Activity: Not on file  Stress: Not on file  Social Connections: Not on file     Family History: The patient's family history includes Colon cancer in her father. ROS:   Please see the history of present illness.    All 14 point review of systems negative except as described per history of present illness  EKGs/Labs/Other Studies Reviewed:      Recent Labs: 06/25/2021: TSH 1.960  Recent Lipid Panel No results found for: CHOL, TRIG, HDL, CHOLHDL, VLDL, LDLCALC, LDLDIRECT  Physical Exam:    VS:  BP 134/78 (BP Location: Right Arm, Patient Position: Sitting)    Pulse 68    Ht 5\' 3"  (1.6 m)    Wt 143 lb (64.9 kg)    SpO2 94%    BMI 25.33 kg/m     Wt Readings from Last 3 Encounters:  08/23/21 143 lb (64.9 kg)  07/17/21 140 lb 12.8 oz (63.9 kg)  06/25/21 142 lb 8 oz (64.6 kg)     GEN:  Well nourished, well developed in no acute distress HEENT: Normal NECK: No JVD; No carotid bruits LYMPHATICS: No lymphadenopathy CARDIAC: RRR, mechanical valve sounds  are crisp no murmurs, no rubs, no gallops RESPIRATORY:  Clear to auscultation without rales, wheezing or rhonchi  ABDOMEN: Soft, non-tender, non-distended MUSCULOSKELETAL:  No edema; No deformity  SKIN: Warm and dry LOWER EXTREMITIES: no swelling NEUROLOGIC:  Alert and oriented x 3 PSYCHIATRIC:  Normal affect   ASSESSMENT:    No diagnosis found. PLAN:    In order of problems listed above:  Status post mitral valve replacement with mechanical valve.  Valve functioning properly.  No significant gradient.  Echocardiogram reviewed. Borderline diabetes that being followed by antimedicine team however last hemoglobin A1c I have is 6.2. Dyslipidemia  she does not have coronary artery disease by the time of surgery.  Her LDL is 82 HDL 55 we will continue present management.  I did review her data which is more updated 07/12/2021 LDL was 60 Controlled continue present management. Need for colonoscopy discussion in my previous note.  We either just lower her INR to may be close to 2 and then proceed with colonoscopy all if that is not visible will bridge her with Lovenox.  I will communicate this to her GI specialist.   Medication Adjustments/Labs and Tests Ordered: Current medicines are reviewed at length with the patient today.  Concerns regarding medicines are outlined above.  No orders of the defined types were placed in this encounter.  Medication changes: No orders of the defined types were placed in this encounter.   Signed, Park Liter, MD, [facility] 08/23/2021 10:07 AM    [place]

## 2021-08-23 NOTE — Patient Instructions (Signed)
Medication Instructions:  Your physician recommends that you continue on your current medications as directed. Please refer to the Current Medication list given to you today.  *If you need a refill on your cardiac medications before your next appointment, please call your pharmacy*   Lab Work: None If you have labs (blood work) drawn today and your tests are completely normal, you will receive your results only by: [place] (if you have MyChart) OR A paper copy in the mail If you have any lab test that is abnormal or we need to change your treatment, we will call you to review the results.   Testing/Procedures: None   Follow-Up: At [facility], you and your health needs are our priority.  As part of our continuing mission to provide you with exceptional heart care, we have created designated Provider Care Teams.  These Care Teams include your primary Cardiologist (physician) and Advanced Practice Providers (APPs -  Physician Assistants and Nurse Practitioners) who all work together to provide you with the care you need, when you need it.  We recommend signing up for the patient portal called "MyChart".  Sign up information is provided on this After Visit Summary.  MyChart is used to connect with patients for Virtual Visits (Telemedicine).  Patients are able to view lab/test results, encounter notes, upcoming appointments, etc.  Non-urgent messages can be sent to your provider as well.   To learn more about what you can do with MyChart, go to [email or web address].    Your next appointment:   4 month(s)  The format for your next appointment:   In Person  Provider:   Dr. Agustin Cree     Other Instructions None

## 2021-08-27 NOTE — Telephone Encounter (Signed)
Patients daughter called to follow up on cardiac clearance.

## 2021-08-28 NOTE — Telephone Encounter (Signed)
Spoke with pt daughter Jetta Lout through a spanish interpreter: Jetta Lout stated pt recently seen Dr. Jenne Campus pt Cardiologist on 08/23/2021 for Cardiac Clearance to proceed with Colonoscopy: Jetta Lout is requesting that the Colonoscopy be done as soon as possible.  Please review chart and advise:

## 2021-08-28 NOTE — Telephone Encounter (Signed)
Proceed with colonoscopy with MiraLAX prep-see last note Got message from Dr. Raliegh Ip (cardiology)  Kirke Corin, I did see Ms. Mckaela today in my office.  From cardiac standpoint to view she is stable I understand that she required colonoscopy.  Actually the 2 options that she have 1 is simply bridge her with Lovenox and then stop, do colonoscopy or try to lower her INR to may be around 2- 2.5 and then proceed with colonoscopy.  Please tell me what do you favor  also if you can give me date for the procedure I will be able to arrange modification of her anticoagulation.  Thank you like always for your expertise and thank you for helping with care of this nice lady  Herbie Baltimore   She would need biopsies due to diarrhea Hence Lovenox bridging would be better Remo Lipps, Please get in touch with Dr. Raliegh Ip regarding date and guidance regarding Lovenox RG

## 2021-08-29 ENCOUNTER — Telehealth: Payer: Self-pay | Admitting: Gastroenterology

## 2021-08-29 ENCOUNTER — Other Ambulatory Visit: Payer: Self-pay

## 2021-08-29 DIAGNOSIS — Z800 Family history of malignant neoplasm of digestive organs: Secondary | ICD-10-CM

## 2021-08-29 DIAGNOSIS — K219 Gastro-esophageal reflux disease without esophagitis: Secondary | ICD-10-CM

## 2021-08-29 DIAGNOSIS — Z1211 Encounter for screening for malignant neoplasm of colon: Secondary | ICD-10-CM

## 2021-08-29 NOTE — Telephone Encounter (Signed)
Please Advise  1: Does this pt only need Colonoscopy or does she need EGD and Colonoscopy: 2. Does this pt need to be scheduled at [facility] hospital:  Previous note stated  Jackquline Denmark, MD  Physician Gastroenterology Telephone Encounter Signed Encounter Date:  11/21/2019   Signed                       Proceed with EGD/colonoscopy at [facility]/bridging per cardiology   RG        Electronically signed by Jackquline Denmark, MD at 01/18/2020  2:19 PM

## 2021-08-29 NOTE — Telephone Encounter (Signed)
Pt's daughter Jetta Lout called inquiring about pt's date of pt's procedure as she stated that pt did not have an appt scheduled yet. I let her know that pt was scheduled for endo colon at [facility] hospital on 11/18/21. I let her know that she should receive instructions in the mail. Pls call pt is this information was not correct. Thank you.

## 2021-08-30 ENCOUNTER — Telehealth: Payer: Self-pay

## 2021-08-30 NOTE — Telephone Encounter (Signed)
Pt scheduled for EGD and Colonoscopy at [facility] on 11/18/2021 at 9:00 with Dr. [name] Case # [id number]: Kingsley Plan (Daughter) Made aware Virtual Previsit scheduled for 10/15/2021 at 10:30: Kingsley Plan (Daughter) Made aware TELEPHONE Appointment  Kingsley Plan (Daughter) Made aware through [place] interpreter. Phone note created and Routed to Dr. Jenne Campus to notify pt of the date and time of procedures and to please advise pt on Coumadin/ Lovenox Bridge. Kingsley Plan (Daughter) Made aware that their office would be contacting them in regard to the medication management of this. Kingsley Plan (Daughter) Made aware through [place] interpreter. Jetta Lout Verbalized understanding with all questions answered.

## 2021-08-30 NOTE — Telephone Encounter (Signed)
Dr. Jackquline Denmark Pt Pt is scheduled for an EGD and Colonoscopy 11/18/2021 at 9:00 at [facility]. Pt is currently on Coumadin 3 mg daily. Per Dr. [name]  She would need biopsies due to diarrhea Hence Lovenox bridging would be better  Please advise Pt on Coumadin/ Lovenox Bridge.

## 2021-09-06 NOTE — Telephone Encounter (Signed)
Please advise on coumadin/lovenox bridging for colonoscopy.

## 2021-09-06 NOTE — Telephone Encounter (Signed)
We do not manage this patient's INR/warfarin and I can not see who does.  Lovenox bridging should be organized by clinic who manages patient's Coumadin

## 2021-09-09 NOTE — Telephone Encounter (Signed)
Hi Maria Ingram. Per our pharmacy team, we do no manage this patient's INR/Coumadin and we cannot see who does. Lovenox bridging should be organized by the clinic who manges patient's Coumadin so we would recommend reaching out to them.  Thank you! Nashika Coker

## 2021-09-10 NOTE — Telephone Encounter (Signed)
Through [place] interpreter message was left for pt daughter Jetta Lout to call back:

## 2021-09-10 NOTE — Telephone Encounter (Signed)
Hi Maria Ingram, pt's daughter Geni Bers returned your call. She stated that Dr. Nyra Capes (PCP) is the MD who prescribes and manages coumadin even though Dr. Agustin Cree is her cardiologist.

## 2021-09-10 NOTE — Telephone Encounter (Signed)
Through spanish interpreter: Pt daughter Maria Ingram stated that Dr. Nyra Capes (PCP) is the MD who prescribes and manages coumadin even though Dr. Agustin Cree is her cardiologist. Maria Ingram notified that our office with reach out to the Dr. Nyra Capes and get him to advise on the Coumadin/ Lovenox Bridge; Dr. Nyra Capes does note use Epic:    Office contacted and fax number received. [phone]:  Letter to be created and faxed to Dr. Nyra Capes with the request to manage the coumadin/ Lovenox bridge:  Maria Ingram verbalized understanding with all questions answered:

## 2021-09-11 ENCOUNTER — Encounter: Payer: Self-pay | Admitting: Gastroenterology

## 2021-09-11 NOTE — Telephone Encounter (Signed)
Chart Review Routing History Since 09/12/2020 [facility] Full Routing History)  Recipients Sent On Sent By Routed Reports   Dr. Maryella Shivers   09/11/2021  9:05 AM Gillermina Hu, RN Letter on 09/11/2021 from Jackquline Denmark, MD      Cover Page Message : Please Manage Lovenox Bridge

## 2021-10-01 ENCOUNTER — Telehealth: Payer: Self-pay

## 2021-10-01 NOTE — Telephone Encounter (Signed)
Message was received from pre-visit team stating that "pt requires interpreter,please reschedule for one hour for pre-visit,we need that when using interpreter, MRN [id number] YOU" Through A spanish Interpreter pt Daughter Kingsley Plan was called; Message left for Jackelin to call back:

## 2021-10-01 NOTE — Telephone Encounter (Signed)
Appointment Changed for 1 hour: Daughter Jetta Lout made aware 10/15/2021 at 10:30: Telephone Previst: Jetta Lout  verbalized understanding with all questions answered.

## 2021-10-11 ENCOUNTER — Telehealth: Payer: Self-pay

## 2021-10-15 ENCOUNTER — Ambulatory Visit (AMBULATORY_SURGERY_CENTER): Payer: Medicaid Other

## 2021-10-15 ENCOUNTER — Other Ambulatory Visit: Payer: Self-pay

## 2021-10-15 VITALS — Ht 63.0 in | Wt 140.0 lb

## 2021-10-15 DIAGNOSIS — K219 Gastro-esophageal reflux disease without esophagitis: Secondary | ICD-10-CM

## 2021-10-15 DIAGNOSIS — Z800 Family history of malignant neoplasm of digestive organs: Secondary | ICD-10-CM

## 2021-10-15 DIAGNOSIS — Z1211 Encounter for screening for malignant neoplasm of colon: Secondary | ICD-10-CM

## 2021-10-15 NOTE — Telephone Encounter (Signed)
Has there been any information given to [place] concerning the Lovenox bridge for this patient? ?This patient had her Pre Visit today and needs to be informed of this information- ?

## 2021-10-15 NOTE — Progress Notes (Signed)
Interpreter-Julietta Number [phone]; ?Daughter-Jacqueline completing PV appt with interpreter via phone call; ? ?No egg or soy allergy known to patient  ?No issues known to pt with past sedation with any surgeries or procedures ?Patient denies ever being told they had issues or difficulty with intubation  ?No FH of Malignant Hyperthermia ?Pt is not on diet pills ?Pt is not on home 02  ?Pt is not on blood thinners  ?Pt reports issues with constipation vs diarrhea; ?No A fib or A flutter ?Pt is fully vaccinated for Covid x 2; ?Miralax prep per Dr.Gupta's request= ?NO PA's for preps discussed with pt in PV today  ?Discussed with pt there will be an out-of-pocket cost for prep and that varies from $0 to 70 + dollars - pt verbalized understanding  ?Due to the COVID-19 pandemic we are asking patients to follow certain guidelines in PV and the [place]   ?Pt aware of COVID protocols and [facility] guidelines  ?PV completed over the phone. Pt verified name, DOB, address and insurance during PV today.  ?Pt mailed instruction packet with copy of consent form to read and not return, and instructions.  ?Pt encouraged to call with questions or issues.  ?If pt has My chart, procedure instructions sent via My Chart  ? ?

## 2021-10-15 NOTE — Telephone Encounter (Signed)
Dr. Maryella Shivers office contacted:  [phone]: Maria Ingram stated that they have received the letter that was faxed prior and that pt has an Office Visit with their office on 10/28/2021 and they will address this at the office visit:  ?

## 2021-10-16 ENCOUNTER — Telehealth: Payer: Self-pay | Admitting: *Deleted

## 2021-10-16 NOTE — Telephone Encounter (Signed)
Gillermina Hu, RN  Levonne Spiller, RN ?Dr. Maryella Shivers office contacted:  [phone]: [place] stated that they have received the letter that was faxed prior and that pt has an Office Visit with their office on 10/28/2021 and they will address this at the office visit:   ?  ?   ?Previous Messages ?  ?----- Message -----  ?From: Levonne Spiller, RN  ?Sent: 10/11/2021   7:54 AM EDT  ?To: Gillermina Hu, RN  ? ?Hey! I may be missing the coumadin hold instructions... I do not see how many days the coumadin should be held? Please advise. Thank you! PV  ?

## 2021-11-13 ENCOUNTER — Encounter (HOSPITAL_COMMUNITY): Payer: Self-pay | Admitting: Gastroenterology

## 2021-11-17 ENCOUNTER — Encounter (HOSPITAL_COMMUNITY): Payer: Self-pay | Admitting: Gastroenterology

## 2021-11-17 NOTE — Anesthesia Preprocedure Evaluation (Addendum)
Anesthesia Evaluation  ?Patient identified by MRN, date of birth, ID band ?Patient awake ? ? ? ?Reviewed: ?Allergy & Precautions, NPO status , Patient's Chart, lab work & pertinent test results ? ?Airway ?Mallampati: II ? ?TM Distance: >3 FB ?Neck ROM: Full ? ? ? Dental ?no notable dental hx. ? ?  ?Pulmonary ?neg pulmonary ROS,  ?  ?Pulmonary exam normal ?breath sounds clear to auscultation ? ? ? ? ? ? Cardiovascular ?hypertension, Pt. on medications ? ?Rhythm:Regular Rate:Normal ? ?Echo 08/2021 ??1. Left ventricular ejection fraction, by estimation, is 50 to 55%. The left ventricle has low normal function. The left ventricle has no regional wall motion abnormalities. Left ventricular diastolic parameters are indeterminate.  ??2. Right ventricular systolic function is normal. The right ventricular size is normal. There is normal pulmonary artery systolic pressure.  ??3. Left atrial size was severely dilated.  ??4. Mechanical valve replaced in [place]. Type and size unknown.. The mitral valve has been repaired/replaced. No evidence of mitral valve regurgitation. No evidence of mitral stenosis. The mean mitral valve gradient is 4.0 mmHg. Echo findings are consistent with normal structure and function of the mitral valve prosthesis.  ??5. Tricuspid valve regurgitation is mild to moderate.  ??6. The aortic valve is normal in structure. Aortic valve regurgitation is not visualized. No aortic stenosis is present.  ??7. The inferior vena cava is normal in size with greater than 50% respiratory variability, suggesting right atrial pressure of 3 mmHg.  ?  ?Neuro/Psych ?negative neurological ROS ?   ? GI/Hepatic ?Neg liver ROS, GERD  ,  ?Endo/Other  ?diabetes ? Renal/GU ?negative Renal ROS  ? ?  ?Musculoskeletal ? ?(+) Arthritis ,  ? Abdominal ?  ?Peds ? Hematology ? ?(+) Blood dyscrasia, anemia ,   ?Anesthesia Other Findings ? ? Reproductive/Obstetrics ? ?  ? ? ? ? ? ? ? ? ? ? ? ? ? ?  ?   ? ? ? ? ? ? ? ?Anesthesia Physical ?Anesthesia Plan ? ?ASA: 3 ? ?Anesthesia Plan:   ? ?Post-op Pain Management: Minimal or no pain anticipated  ? ?Induction: Intravenous ? ?PONV Risk Score and Plan: 2 and TIVA, Propofol infusion and Treatment may vary due to age or medical condition ? ?Airway Management Planned: Natural Airway ? ?Additional Equipment:  ? ?Intra-op Plan:  ? ?Post-operative Plan:  ? ?Informed Consent: I have reviewed the patients History and Physical, chart, labs and discussed the procedure including the risks, benefits and alternatives for the proposed anesthesia with the patient or authorized representative who has indicated his/her understanding and acceptance.  ? ? ? ?Dental advisory given ? ?Plan Discussed with: CRNA ? ?Anesthesia Plan Comments:   ? ? ? ? ? ?Anesthesia Quick Evaluation ? ?

## 2021-11-18 ENCOUNTER — Encounter (HOSPITAL_COMMUNITY)
Admission: RE | Disposition: A | Discharge status: Home / Self Care | Payer: Self-pay | Source: Home / Self Care | Attending: Gastroenterology

## 2021-11-18 ENCOUNTER — Ambulatory Visit (HOSPITAL_COMMUNITY)
Admission: RE | Admit: 2021-11-18 | Discharge: 2021-11-18 | Disposition: A | Discharge status: Home / Self Care | Payer: Medicaid Other | Attending: Gastroenterology | Admitting: Gastroenterology

## 2021-11-18 ENCOUNTER — Encounter (HOSPITAL_COMMUNITY): Payer: Self-pay | Admitting: Gastroenterology

## 2021-11-18 ENCOUNTER — Ambulatory Visit (HOSPITAL_BASED_OUTPATIENT_CLINIC_OR_DEPARTMENT_OTHER): Payer: Medicaid Other | Admitting: Anesthesiology

## 2021-11-18 ENCOUNTER — Ambulatory Visit (HOSPITAL_COMMUNITY): Payer: Medicaid Other | Admitting: Anesthesiology

## 2021-11-18 ENCOUNTER — Other Ambulatory Visit: Payer: Self-pay

## 2021-11-18 DIAGNOSIS — Z800 Family history of malignant neoplasm of digestive organs: Secondary | ICD-10-CM | POA: Insufficient documentation

## 2021-11-18 DIAGNOSIS — M1990 Unspecified osteoarthritis, unspecified site: Secondary | ICD-10-CM | POA: Insufficient documentation

## 2021-11-18 DIAGNOSIS — K640 First degree hemorrhoids: Secondary | ICD-10-CM | POA: Diagnosis not present

## 2021-11-18 DIAGNOSIS — K449 Diaphragmatic hernia without obstruction or gangrene: Secondary | ICD-10-CM

## 2021-11-18 DIAGNOSIS — D649 Anemia, unspecified: Secondary | ICD-10-CM | POA: Diagnosis not present

## 2021-11-18 DIAGNOSIS — R1013 Epigastric pain: Secondary | ICD-10-CM

## 2021-11-18 DIAGNOSIS — D123 Benign neoplasm of transverse colon: Secondary | ICD-10-CM

## 2021-11-18 DIAGNOSIS — E119 Type 2 diabetes mellitus without complications: Secondary | ICD-10-CM | POA: Diagnosis not present

## 2021-11-18 DIAGNOSIS — Z79899 Other long term (current) drug therapy: Secondary | ICD-10-CM | POA: Insufficient documentation

## 2021-11-18 DIAGNOSIS — K635 Polyp of colon: Secondary | ICD-10-CM

## 2021-11-18 DIAGNOSIS — R195 Other fecal abnormalities: Secondary | ICD-10-CM

## 2021-11-18 DIAGNOSIS — D759 Disease of blood and blood-forming organs, unspecified: Secondary | ICD-10-CM | POA: Insufficient documentation

## 2021-11-18 DIAGNOSIS — I340 Nonrheumatic mitral (valve) insufficiency: Secondary | ICD-10-CM | POA: Diagnosis not present

## 2021-11-18 DIAGNOSIS — D122 Benign neoplasm of ascending colon: Secondary | ICD-10-CM

## 2021-11-18 DIAGNOSIS — D124 Benign neoplasm of descending colon: Secondary | ICD-10-CM | POA: Diagnosis not present

## 2021-11-18 DIAGNOSIS — K5289 Other specified noninfective gastroenteritis and colitis: Secondary | ICD-10-CM | POA: Diagnosis not present

## 2021-11-18 DIAGNOSIS — K219 Gastro-esophageal reflux disease without esophagitis: Secondary | ICD-10-CM | POA: Insufficient documentation

## 2021-11-18 DIAGNOSIS — K5730 Diverticulosis of large intestine without perforation or abscess without bleeding: Secondary | ICD-10-CM | POA: Diagnosis not present

## 2021-11-18 DIAGNOSIS — Z1211 Encounter for screening for malignant neoplasm of colon: Secondary | ICD-10-CM | POA: Insufficient documentation

## 2021-11-18 HISTORY — PX: ESOPHAGOGASTRODUODENOSCOPY (EGD) WITH PROPOFOL: SHX5813

## 2021-11-18 HISTORY — PX: BIOPSY: SHX5522

## 2021-11-18 HISTORY — PX: SUBMUCOSAL TATTOO INJECTION: SHX6856

## 2021-11-18 HISTORY — PX: COLONOSCOPY WITH PROPOFOL: SHX5780

## 2021-11-18 HISTORY — PX: POLYPECTOMY: SHX5525

## 2021-11-18 LAB — GLUCOSE, CAPILLARY: Glucose-Capillary: 119 mg/dL — ABNORMAL HIGH (ref 70–99)

## 2021-11-18 SURGERY — COLONOSCOPY WITH PROPOFOL
Anesthesia: Monitor Anesthesia Care

## 2021-11-18 MED ORDER — LIDOCAINE 2% (20 MG/ML) 5 ML SYRINGE
INTRAMUSCULAR | Status: DC | PRN
  Administered 2021-11-18: 40 mg via INTRAVENOUS

## 2021-11-18 MED ORDER — PHENYLEPHRINE HCL (PRESSORS) 10 MG/ML IV SOLN
INTRAVENOUS | Status: DC | PRN
  Administered 2021-11-18: 80 ug via INTRAVENOUS

## 2021-11-18 MED ORDER — LACTATED RINGERS IV SOLN: INTRAVENOUS | Status: DC

## 2021-11-18 MED ORDER — PROPOFOL 500 MG/50ML IV EMUL
INTRAVENOUS | Status: DC | PRN
  Administered 2021-11-18: 125 ug/kg/min via INTRAVENOUS

## 2021-11-18 MED ORDER — ESMOLOL HCL 100 MG/10ML IV SOLN
INTRAVENOUS | Status: DC | PRN
  Administered 2021-11-18 (×2): 10 mg via INTRAVENOUS

## 2021-11-18 MED ORDER — PROPOFOL 500 MG/50ML IV EMUL
INTRAVENOUS | Status: DC | PRN
Start: 2021-11-18 — End: 2021-11-18
  Administered 2021-11-18: 10 mg via INTRAVENOUS
  Administered 2021-11-18: 30 mg via INTRAVENOUS
  Administered 2021-11-18: 20 mg via INTRAVENOUS
  Administered 2021-11-18: 10 mg via INTRAVENOUS

## 2021-11-18 MED ORDER — SPOT INK MARKER SYRINGE KIT
PACK | SUBMUCOSAL | Status: DC | PRN
  Administered 2021-11-18: 1 mL via SUBMUCOSAL

## 2021-11-18 SURGICAL SUPPLY — 25 items

## 2021-11-18 NOTE — H&P (Signed)
?  ?  ?Chief Complaint: For colorectal cancer screening and reflux ?  ?Referring Provider: Dr. Sheral Apley    ?  ?  ?ASSESSMENT AND PLAN;  ?  ?#1. Generalized Abdo pain with left lower quadrant abdominal tenderness ?#2. Diarrhea. Stool neg for infection (Dr Nyra Capes) ?#3. H/O rectocele (followed by Dr. Marvel Plan) ?#4. FH colon cancer  (dad at age 73)- but pt on coumadin for mech MVR.  Discussed 3 options (1.  Watchful waiting 2.  Cologuard  3.  Colonoscopy -discussed risks and benefits for each.  She opted for Cologuard previously-ordered but not done) ?  ?Plan: ?EGD/colon today ?Lovenox bridging already being done ?  ?  ?  ?  ?HPI:   ?  ?Maria Ingram is a 71 y.o. female  ?Seen as an emergency workin ?History through interpreter. ?Has been having generalized abdominal pain especially left lower quadrant with associated chills but no fever. ?Had urinary tract infection a month ago, given p.o. trimethoprim. ?Thereafter started having more problems specially with diarrhea-currently complains of bowel movements at the frequency of 4-5 times per day.  She will occasionally have nocturnal symptoms as well.  No melena or hematochezia.  Very much distressed with symptoms.  Last dose of antibiotics was on 10/08/2019.  She had stool studies by Dr. Nyra Capes which was negative per patient's family. ?  ?Also has some epigastric discomfort especially after eating.  Has been taking omeprazole and Dexilant together. ?No nausea or vomiting ?No lower abdominal pain. ?No nausea, vomiting,  regurgitation, odynophagia or dysphagia.  No significant diarrhea or constipation.  There is no melena or hematochezia. No unintentional weight loss. 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  She was evaluated in [place] and was told to hold off on colonoscopy as she could not be taken off Coumadin. ?Has been followed by Dr. Raliegh Ip here ?  ?  ?  ?    ?Past Medical History:  ?Diagnosis Date  ? GERD (gastroesophageal reflux disease)     ? History of rheumatic heart disease    ? Hyperlipidemia    ? Hypertension    ? Pre-diabetes    ?  Borderline  ? Psoriasis    ?  ?  ?     ?Past Surgical History:  ?Procedure Laterality Date  ? LEFT HEART CATH AND CORONARY ANGIOGRAPHY N/A 02/16/2018  ?  Procedure: LEFT HEART CATH AND CORONARY ANGIOGRAPHY;  Surgeon: [place], Peter M, MD;  Location: [facility];  Service: Cardiovascular;  Laterality: N/A;  ? VALVE REPLACEMENT      ?  St Jude mitral valve prosthesis  ?  ?  ?     ?Family History  ?Problem Relation Age of Onset  ? Colon cancer Father    ?  ?  ?Social History  ?  ?    ?Tobacco Use  ? Smoking status: Never Smoker  ? Smokeless tobacco: Never Used  ?Substance Use Topics  ? Alcohol use: Not Currently  ? Drug use: Never  ?  ?  ?      ?Current Outpatient Medications  ?Medication Sig Dispense Refill  ? warfarin (COUMADIN) 4 MG tablet Take 4 mg by mouth See admin instructions. Alternates taking 3 mg one day then 4 mg the next      ? atorvastatin (LIPITOR) 10 MG tablet Take 10 mg by mouth daily.      ? citalopram (CELEXA) 10 MG tablet Take 10 mg by mouth daily.      ?  diphenhydrAMINE (BENADRYL) 50 MG capsule Take 50 mg by mouth every 6 (six) hours as needed.      ? lisinopril-hydrochlorothiazide (ZESTORETIC) 20-12.5 MG tablet TAKE 1 TABLET BY MOUTH ONCE DAILY      ? loratadine (CLARITIN) 10 MG tablet Take 10 mg by mouth daily.      ? memantine (NAMENDA) 5 MG tablet Take 5 mg by mouth 2 times daily.      ? ranitidine (ZANTAC) 150 MG tablet Take 150 mg by mouth daily.       ? traMADol (ULTRAM) 50 MG tablet Take 50 mg by mouth daily.       ? triamcinolone cream (KENALOG) 0.5 % APPLY TO THE AFFECTED AREA(S) TWICE (2) DAILY AS NEEDED      ? trimethoprim (TRIMPEX) 100 MG tablet Take 100 mg by mouth daily.      ?  ?No current facility-administered medications for this visit.  ?  ?  ?     ?Allergies  ?Allergen Reactions  ? Iodine    ?    Eye swelling, reaction happened previously but has used since without reaction.   Unsure if this is still an allergy   ?  ?  ?Review of Systems:  ?neg ?  ?  ?  ?Physical Exam:   ?  ?Ht '4\' 11"'$  (1.499 m)   Wt 150 lb 6 oz (68.2 kg)   BMI 30.37 kg/m?  ?   Danley Danker Weights  ?  10/28/19 1613  ?Weight: 150 lb 6 oz (68.2 kg)  ?  ?Constitutional:  Well-developed, in no acute distress. ?Psychiatric: Normal mood and affect. Behavior is normal. ?HEENT: Pupils normal.  Conjunctivae are normal. No scleral icterus. ?Neck supple.  ?Cardiovascular: Normal rate, regular rhythm. No edema ?Pulmonary/chest: Effort normal and breath sounds normal. No wheezing, rales or rhonchi. ?Abdominal: Soft, nondistended, mildly bloated, generalized abdominal tenderness especially left lower quadrant.  No rebound.  Bowel sounds active throughout. There are no masses palpable. No hepatomegaly. ?Rectal:  defered ?Neurological: Alert and oriented to person place and time. ?Skin: Skin is warm and dry. No rashes noted. ?Have discussed in detail with the patient, patient's 2 daughters through the interpreter. ?  ?Carmell Austria, MD ?

## 2021-11-18 NOTE — Transfer of Care (Signed)
Immediate Anesthesia Transfer of Care Note ? ?Patient: Maria Ingram ? ?Procedure(s) Performed: Procedure(s): ?COLONOSCOPY WITH PROPOFOL (N/A) ?ESOPHAGOGASTRODUODENOSCOPY (EGD) WITH PROPOFOL (N/A) ?BIOPSY ?POLYPECTOMY ?SUBMUCOSAL TATTOO INJECTION ? ?Patient Location: PACU ? ?Anesthesia Type:MAC ? ?Level of Consciousness:  sedated, patient cooperative and responds to stimulation ? ?Airway & Oxygen Therapy:Patient Spontanous Breathing and Patient connected to face mask oxgen ? ?Post-op Assessment:  Report given to PACU RN and Post -op Vital signs reviewed and stable ? ?Post vital signs:  Reviewed and stable ? ?Last Vitals:  ?Vitals:  ? 11/18/21 0805  ?BP: 140/76  ?Pulse: 94  ?Resp: 18  ?Temp: 36.7 ?C  ?SpO2: 99%  ? ? ?Complications: No apparent anesthesia complications ? ?

## 2021-11-18 NOTE — Op Note (Signed)
[facility] ?Patient Name: Maria Ingram ?Procedure Date: 11/18/2021 ?MRN: [id number] ?Attending MD: Jackquline Denmark , MD ?Date of Birth: 10/17/1950 ?CSN: [id number] ?Age: 71 ?Admit Type: Outpatient ?Procedure:                Upper GI endoscopy ?Indications:              IDA with positive Cologuard ?Providers:                Jackquline Denmark, MD, Jaci Carrel, RN, Narda Rutherford  ?                          [place], Technician, [employer], CRNA ?Referring MD:              ?Medicines:                Monitored Anesthesia Care ?Complications:            No immediate complications. ?Estimated Blood Loss:     Estimated blood loss: none. ?Procedure:                Pre-Anesthesia Assessment: ?                          - Prior to the procedure, a History and Physical  ?                          was performed, and patient medications and  ?                          allergies were reviewed. The patient's tolerance of  ?                          previous anesthesia was also reviewed. The risks  ?                          and benefits of the procedure and the sedation  ?                          options and risks were discussed with the patient.  ?                          All questions were answered, and informed consent  ?                          was obtained. Prior Anticoagulants: The patient has  ?                          taken Coumadin (warfarin), last dose was 5 days  ?                          prior to procedure, patient on Lovenox bridging.  ?                          ASA Grade Assessment: III - A patient with severe  ?  systemic disease. After reviewing the risks and  ?                          benefits, the patient was deemed in satisfactory  ?                          condition to undergo the procedure. ?                          After obtaining informed consent, the endoscope was  ?                          passed under direct vision. Throughout the  ?                           procedure, the patient's blood pressure, pulse, and  ?                          oxygen saturations were monitored continuously. The  ?                          [id number]-[id number] ([id number]) Olympus endoscope was introduced  ?                          through the mouth, and advanced to the second part  ?                          of duodenum. The upper GI endoscopy was  ?                          accomplished without difficulty. The patient  ?                          tolerated the procedure well. ?Findings: ?     The examined esophagus was normal with well-defined Z-line at 35 cm,  ?     examined by NBI. ?     A 2 cm hiatal hernia was present. ?     The examined duodenum was normal. Biopsies for histology were taken with  ?     a cold forceps for evaluation of celiac disease. ?Impression:               - Normal esophagus. ?                          - 2 cm hiatal hernia. ?                          - Normal examined duodenum. Biopsied. ?Moderate Sedation: ?     Not Applicable - Patient had care per Anesthesia. ?Recommendation:           - Patient has a contact number available for  ?                          emergencies. The signs and symptoms of potential  ?  delayed complications were discussed with the  ?                          patient. Return to normal activities tomorrow.  ?                          Written discharge instructions were provided to the  ?                          patient. ?                          - Resume previous diet. ?                          - Continue present medications. ?                          - Proceed with colonoscopy. ?                          - Await pathology results. ?                          - The findings and recommendations were discussed  ?                          with the patient's family. ?Procedure Code(s):        --- Professional --- ?                          [phone], Esophagogastroduodenoscopy, flexible,  ?                          transoral; with biopsy,  single or multiple ?Diagnosis Code(s):        --- Professional --- ?                          K44.9, Diaphragmatic hernia without obstruction or  ?                          gangrene ?                          R10.13, Epigastric pain ?CPT copyright 2019 American Medical Association. All rights reserved. ?The codes documented in this report are preliminary and upon coder review may  ?be revised to meet current compliance requirements. ?Jackquline Denmark, MD ?11/18/2021 9:51:05 AM ?This report has been signed electronically. ?Number of Addenda: 0 ?

## 2021-11-18 NOTE — Op Note (Signed)
[facility] ?Patient Name: Maria Ingram ?Procedure Date: 11/18/2021 ?MRN: [id number] ?Attending MD: Jackquline Denmark , MD ?Date of Birth: 16-Jul-1951 ?CSN: [id number] ?Age: 71 ?Admit Type: Outpatient ?Procedure:                Colonoscopy ?Indications:              Positive Cologuard test. H/O diarrhea ?Providers:                Jackquline Denmark, MD, Jaci Carrel, RN, Narda Rutherford  ?                          [place], Technician, [employer], CRNA ?Referring MD:              ?Medicines:                Monitored Anesthesia Care ?Complications:            No immediate complications. ?Estimated Blood Loss:     Estimated blood loss: none. ?Procedure:                Pre-Anesthesia Assessment: ?                          - Prior to the procedure, a History and Physical  ?                          was performed, and patient medications and  ?                          allergies were reviewed. The patient's tolerance of  ?                          previous anesthesia was also reviewed. The risks  ?                          and benefits of the procedure and the sedation  ?                          options and risks were discussed with the patient.  ?                          All questions were answered, and informed consent  ?                          was obtained. Prior Anticoagulants: The patient has  ?                          taken Coumadin (warfarin), last dose was 5 days  ?                          prior to procedure with Lovenox bridging. ASA Grade  ?                          Assessment: III - A patient with severe systemic  ?  disease. After reviewing the risks and benefits,  ?                          the patient was deemed in satisfactory condition to  ?                          undergo the procedure. ?                          After obtaining informed consent, the colonoscope  ?                          was passed under direct vision. Throughout the  ?                           procedure, the patient's blood pressure, pulse, and  ?                          oxygen saturations were monitored continuously. The  ?                          [id number]-HQ190L ([id number]) Olympus colonoscope was  ?                          introduced through the anus and advanced to the the  ?                          cecum, identified by appendiceal orifice and  ?                          ileocecal valve. The colonoscopy was performed  ?                          without difficulty. The patient tolerated the  ?                          procedure well. The quality of the bowel  ?                          preparation was good. ?Scope In: 9:04:46 AM ?Scope Out: 9:43:52 AM ?Total Procedure Duration: 0 hours 39 minutes 6 seconds  ?Findings: ?     Three sessile polyps were found in the proximal transverse colon and mid  ?     ascending colon (2). The polyps were 6 to 8 mm in size. These polyps  ?     were removed with a cold snare. Resection and retrieval were complete. ?     Four sessile polyps were found in the distal descending colon. The  ?     polyps were 8 to 12 mm in size. These polyps were removed with a hot  ?     snare. Resection and retrieval were complete. Area was tattooed with an  ?     injection of 1 mL of Spot (carbon black). ?     A few small-mouthed diverticula were found in the sigmoid colon. The  ?  colonic mucosa otherwise was normal. Biopsies for histology were taken  ?     with a cold forceps from the entire colon for evaluation of microscopic  ?     colitis. ?     Non-bleeding internal hemorrhoids were found during retroflexion. The  ?     hemorrhoids were small and Grade I (internal hemorrhoids that do not  ?     prolapse). ?     The exam was otherwise without abnormality on direct and retroflexion  ?     views. ?Impression:               - Three 6 to 8 mm polyps in the proximal transverse  ?                          colon and in the mid ascending colon, removed with  ?                          a cold  snare. Resected and retrieved. ?                          - Four 8 to 12 mm polyps in the distal descending  ?                          colon, removed with a hot snare. Resected and  ?                          retrieved. Tattooed. ?                          - Mild sigmoid diverticulosis. ?                          - Non-bleeding internal hemorrhoids. ?                          - The examination was otherwise normal on direct  ?                          and retroflexion views. ?Moderate Sedation: ?     Not Applicable - Patient had care per Anesthesia. ?Recommendation:           - Patient has a contact number available for  ?                          emergencies. The signs and symptoms of potential  ?                          delayed complications were discussed with the  ?                          patient. Return to normal activities tomorrow.  ?                          Written discharge instructions were provided to the  ?  patient. ?                          - Resume previous diet. ?                          - Continue present medications. ?                          - Await pathology results. ?                          - Repeat colonoscopy for surveillance based on  ?                          pathology results. ?                          - Resume Lovenox at 8 PM tonight and continue as  ?                          directed by cardiology. ?                          - Resume coumadin 4/18 ?                          - The findings and recommendations were discussed  ?                          with the patient's family. ?Procedure Code(s):        --- Professional --- ?                          [phone], Colonoscopy, flexible; with removal of  ?                          tumor(s), polyp(s), or other lesion(s) by snare  ?                          technique ?                          45381, Colonoscopy, flexible; with directed  ?                          submucosal injection(s), any substance ?                           45380, 59, Colonoscopy, flexible; with biopsy,  ?                          single or multiple ?Diagnosis Code(s):        --- Professional --- ?                          K63.5, Polyp of colon ?  K64.0, First degree hemorrhoids ?                          R19.5, Other fecal abnormalities ?                          K57.30, Diverticulosis of large intestine without  ?                          perforation or abscess without bleeding ?CPT copyright 2019 American Medical Association. All rights reserved. ?The codes documented in this report are preliminary and upon coder review may  ?be revised to meet current compliance requirements. ?Jackquline Denmark, MD ?11/18/2021 10:02:19 AM ?This report has been signed electronically. ?Number of Addenda: 0 ?

## 2021-11-18 NOTE — Discharge Instructions (Signed)
YOU HAD AN ENDOSCOPIC PROCEDURE TODAY: Refer to the procedure report and other information in the discharge instructions given to you for any specific questions about what was found during the examination. If this information does not answer your questions, please call [place] office at [phone] to clarify.  ° °YOU SHOULD EXPECT: Some feelings of bloating in the abdomen. Passage of more gas than usual. Walking can help get rid of the air that was put into your GI tract during the procedure and reduce the bloating. If you had a lower endoscopy (such as a colonoscopy or flexible sigmoidoscopy) you may notice spotting of blood in your stool or on the toilet paper. Some abdominal soreness may be present for a day or two, also. ° °DIET: Your first meal following the procedure should be a light meal and then it is ok to progress to your normal diet. A half-sandwich or bowl of soup is an example of a good first meal. Heavy or fried foods are harder to digest and may make you feel nauseous or bloated. Drink plenty of fluids but you should avoid alcoholic beverages for 24 hours. If you had a esophageal dilation, please see attached instructions for diet.   ° °ACTIVITY: Your care partner should take you home directly after the procedure. You should plan to take it easy, moving slowly for the rest of the day. You can resume normal activity the day after the procedure however YOU SHOULD NOT DRIVE, use power tools, machinery or perform tasks that involve climbing or major physical exertion for 24 hours (because of the sedation medicines used during the test).  ° °SYMPTOMS TO REPORT IMMEDIATELY: °A gastroenterologist can be reached at any hour. Please call [phone]  for any of the following symptoms:  °Following lower endoscopy (colonoscopy, flexible sigmoidoscopy) °Excessive amounts of blood in the stool  °Significant tenderness, worsening of abdominal pains  °Swelling of the abdomen that is new, acute  °Fever of 100° or  higher  °Following upper endoscopy (EGD, EUS, ERCP, esophageal dilation) °Vomiting of blood or coffee ground material  °New, significant abdominal pain  °New, significant chest pain or pain under the shoulder blades  °Painful or persistently difficult swallowing  °New shortness of breath  °Black, tarry-looking or red, bloody stools ° °FOLLOW UP:  °If any biopsies were taken you will be contacted by phone or by letter within the next 1-3 weeks. Call [phone]  if you have not heard about the biopsies in 3 weeks.  °Please also call with any specific questions about appointments or follow up tests. ° °

## 2021-11-18 NOTE — Progress Notes (Signed)
Used portable interpreter ?

## 2021-11-18 NOTE — Anesthesia Postprocedure Evaluation (Signed)
Anesthesia Post Note ? ?Patient: Maria Ingram ? ?Procedure(s) Performed: COLONOSCOPY WITH PROPOFOL ?ESOPHAGOGASTRODUODENOSCOPY (EGD) WITH PROPOFOL ?BIOPSY ?POLYPECTOMY ?SUBMUCOSAL TATTOO INJECTION ? ?  ? ?Patient location during evaluation: PACU ?Anesthesia Type: MAC ?Level of consciousness: awake and alert ?Pain management: pain level controlled ?Vital Signs Assessment: post-procedure vital signs reviewed and stable ?Respiratory status: spontaneous breathing ?Cardiovascular status: stable ?Anesthetic complications: no ? ? ?No notable events documented. ? ?Last Vitals:  ?Vitals:  ? 11/18/21 1006 11/18/21 1020  ?BP: (!) 121/52 (!) 132/41  ?Pulse: 86 79  ?Resp: 19 19  ?Temp:    ?SpO2: 96% 98%  ?  ?Last Pain:  ?Vitals:  ? 11/18/21 1000  ?TempSrc:   ?PainSc: 0-No pain  ? ? ?  ?  ?  ?  ?  ?  ? ?Nolon Nations ? ? ? ? ?

## 2021-11-19 ENCOUNTER — Encounter (HOSPITAL_COMMUNITY): Payer: Self-pay | Admitting: Gastroenterology

## 2021-11-19 ENCOUNTER — Encounter: Payer: Self-pay | Admitting: Gastroenterology

## 2021-11-19 LAB — SURGICAL PATHOLOGY

## 2021-11-26 ENCOUNTER — Telehealth: Payer: Self-pay | Admitting: Gastroenterology

## 2021-11-26 NOTE — Telephone Encounter (Signed)
Patient daughter is calling, states she still has not heard from [place] regarding pathology results. Please advise. Needs interpreter.  ?

## 2021-11-26 NOTE — Telephone Encounter (Signed)
Patient's daughter is returning your call 

## 2021-11-26 NOTE — Telephone Encounter (Signed)
Spoke to pt Daughter Jetta Lout: Jetta Lout was questioning what the path results were from recent procedure: ?Jetta Lout was made aware of recent letter that was created for Pt by Dr. [name]. Letter was read aloud to [place]. :Letter mailed to pt: Jackelin made aware; ?Jackelin verbalized understanding with all questions answered.  ? ?

## 2021-11-26 NOTE — Telephone Encounter (Signed)
Left message for pt daughter Kingsley Plan to call back  ?

## 2021-12-23 ENCOUNTER — Ambulatory Visit (INDEPENDENT_AMBULATORY_CARE_PROVIDER_SITE_OTHER): Payer: Medicaid Other | Admitting: Cardiology

## 2021-12-23 ENCOUNTER — Encounter: Payer: Self-pay | Admitting: Cardiology

## 2021-12-23 VITALS — BP 144/72 | HR 68 | Ht 63.0 in | Wt 140.4 lb

## 2021-12-23 DIAGNOSIS — I10 Essential (primary) hypertension: Secondary | ICD-10-CM

## 2021-12-23 DIAGNOSIS — R7303 Prediabetes: Secondary | ICD-10-CM

## 2021-12-23 DIAGNOSIS — Z952 Presence of prosthetic heart valve: Secondary | ICD-10-CM | POA: Diagnosis not present

## 2021-12-23 DIAGNOSIS — E785 Hyperlipidemia, unspecified: Secondary | ICD-10-CM

## 2021-12-23 NOTE — Patient Instructions (Signed)

## 2021-12-23 NOTE — Progress Notes (Signed)
Cardiology Office Note:    Date:  12/23/2021   ID:  Maria Ingram, DOB [birth year], MRN [id number]  PCP:  Maria Shivers, MD  Cardiologist:  Maria Campus, MD    Referring MD: Maria Shivers, MD   Chief Complaint  Patient presents with   worries about coagulantion    History of Present Illness:    Maria Ingram is a 71 y.o. female   female  female  with past medical history significant for mitral valve replacement with mechanical valve done couple years ago in [place], dyslipidemia, hypertension, prediabetes, diabetes.  He did have some abnormal stress test in 2019 cardiac catheterization done after that showed normal coronaries She comes today to my office for follow-up.  She did have finally colonoscopy done.  Colonoscopy showed some polyps that were taking out.  Overall she is doing well but she does have difficulty with diet.  She also was noted to have some diverticuli and she was recommended high fiber diet she is trying to find good diet for her with some difficulty she complain of having constipation alternated with some diarrhea.  That is what bothers her a lot.  There are also some fluctuation for INR.  Cardiac wise doing well.  Denies have any chest pain tightness squeezing pressure burning chest overall seems to be doing well.  Past Medical History:  Diagnosis Date   Abnormal stress test 02/11/2018   Mid and apical portion of the anterior wall ischemia and stress test in July 2019 Cardiac catheterization after that showing normal coronaries   Anemia 11/10/2019   Blood transfusion without reported diagnosis 1981   post delivery of child   Borderline diabetes 02/08/2018   Cataract    bilateral sx   Diabetes mellitus without complication ([place])    diet controlled- no meds   Dyslipidemia 02/08/2018   Essential hypertension 02/08/2018   GERD (gastroesophageal reflux disease)    H/O mitral valve replacement with mechanical valve 01/08/2015   Done in  2000, [facility]  Formatting of this note might be different from the original. Done in 2000, [facility]   History of mitral valve prosthesis 02/08/2018   Done many years ago in [place]   History of rheumatic heart disease    Hyperlipidemia    on meds   Hypertension    on meds   Osteoarthritis of left knee 09/13/2018   bilateral hands/legs   Pain in left knee 05/20/2018   Pre-diabetes    Borderline   Pre-operative clearance 02/08/2018   Psoriasis    Seasonal allergies     Past Surgical History:  Procedure Laterality Date   BIOPSY  11/18/2021   Procedure: BIOPSY;  Surgeon: Jackquline Denmark, MD;  Location: [facility];  Service: Endoscopy;;  EGD and COLON   COLONOSCOPY WITH PROPOFOL N/A 11/18/2021   Procedure: COLONOSCOPY WITH PROPOFOL;  Surgeon: Jackquline Denmark, MD;  Location: [facility];  Service: Endoscopy;  Laterality: N/A;   ESOPHAGOGASTRODUODENOSCOPY (EGD) WITH PROPOFOL N/A 11/18/2021   Procedure: ESOPHAGOGASTRODUODENOSCOPY (EGD) WITH PROPOFOL;  Surgeon: Jackquline Denmark, MD;  Location: [facility];  Service: Endoscopy;  Laterality: N/A;   KNEE ARTHROSCOPY W/ MENISCAL REPAIR Right 2018   LEFT HEART CATH AND CORONARY ANGIOGRAPHY N/A 02/16/2018   Procedure: LEFT HEART CATH AND CORONARY ANGIOGRAPHY;  Surgeon: Martinique, Peter M, MD;  Location: [place] [facility];  Service: Cardiovascular;  Laterality: N/A;   POLYPECTOMY  11/18/2021   Procedure: POLYPECTOMY;  Surgeon: Jackquline Denmark, MD;  Location: [facility];  Service: Endoscopy;;  SUBMUCOSAL TATTOO INJECTION  11/18/2021   Procedure: SUBMUCOSAL TATTOO INJECTION;  Surgeon: Jackquline Denmark, MD;  Location: [facility];  Service: Endoscopy;;   VALVE REPLACEMENT     St Jude mitral valve prosthesis    Current Medications: Current Meds  Medication Sig   atorvastatin (LIPITOR) 10 MG tablet Take 10 mg by mouth daily.   citalopram (CELEXA) 10 MG tablet Take 10 mg by mouth daily.   dexlansoprazole (DEXILANT) 60 MG capsule Take 60 mg by mouth daily.    ENSTILAR 0.005-0.064 % FOAM Apply 1 application. topically 3 (three) times a week.   Finerenone (KERENDIA) 10 MG TABS Take 10 mg by mouth daily.   lisinopril (ZESTRIL) 20 MG tablet Take 20 mg by mouth daily.   loratadine (CLARITIN) 10 MG tablet Take 10 mg by mouth daily.   memantine (NAMENDA) 10 MG tablet Take 1 tablet (10 mg total) by mouth 2 (two) times daily.   memantine (NAMENDA) 5 MG tablet Take 5 mg by mouth 2 (two) times daily.   SKYRIZI 150 MG/ML SOSY Inject 150 mg as directed every 3 (three) months.   triamcinolone cream (KENALOG) 0.5 % Apply 1 application. topically daily.   warfarin (COUMADIN) 1 MG tablet Take 1 mg by mouth See admin instructions. Take 1 mg by mouth daily except for Sunday   warfarin (COUMADIN) 3 MG tablet Take 3 mg by mouth daily.     Allergies:   Patient has no known allergies.   Social History   Socioeconomic History   Marital status: Married    Spouse name: Not on file   Number of children: Not on file   Years of education: Not on file   Highest education level: Not on file  Occupational History   Not on file  Tobacco Use   Smoking status: Never   Smokeless tobacco: Never  Vaping Use   Vaping Use: Never used  Substance and Sexual Activity   Alcohol use: Not Currently   Drug use: Never   Sexual activity: Not on file  Other Topics Concern   Not on file  Social History Narrative   Not on file   Social Determinants of Health   Financial Resource Strain: Not on file  Food Insecurity: Not on file  Transportation Needs: Not on file  Physical Activity: Not on file  Stress: Not on file  Social Connections: Not on file     Family History: The patient's family history includes Colon cancer (age of onset: 33) in her father; Colon polyps (age of onset: 23) in her father. There is no history of Esophageal cancer, Rectal cancer, or Stomach cancer. ROS:   Please see the history of present illness.    All 14 point review of systems negative except as  described per history of present illness  EKGs/Labs/Other Studies Reviewed:      Recent Labs: 06/25/2021: TSH 1.960  Recent Lipid Panel No results found for: CHOL, TRIG, HDL, CHOLHDL, VLDL, LDLCALC, LDLDIRECT  Physical Exam:    VS:  BP (!) 144/72 (BP Location: Left Arm, Patient Position: Sitting)   Pulse 68   Ht '5\' 3"'$  (1.6 m)   Wt 140 lb 6.4 oz (63.7 kg)   SpO2 92%   BMI 24.87 kg/m     Wt Readings from Last 3 Encounters:  12/23/21 140 lb 6.4 oz (63.7 kg)  11/18/21 141 lb (64 kg)  10/15/21 140 lb (63.5 kg)     GEN:  Well nourished, well developed in no acute distress  HEENT: Normal NECK: No JVD; No carotid bruits LYMPHATICS: No lymphadenopathy CARDIAC: RRR, mechanical valve sounds are present, crisp, soft systolic murmur grade 1/6, no rubs, no gallops RESPIRATORY:  Clear to auscultation without rales, wheezing or rhonchi  ABDOMEN: Soft, non-tender, non-distended MUSCULOSKELETAL:  No edema; No deformity  SKIN: Warm and dry LOWER EXTREMITIES: no swelling NEUROLOGIC:  Alert and oriented x 3 PSYCHIATRIC:  Normal affect   ASSESSMENT:    1. H/O mitral valve replacement with mechanical valve   2. Essential hypertension   3. Borderline diabetes   4. Dyslipidemia    PLAN:    In order of problems listed above:  History of mitral valve replacement with [employer] Jude prosthesis done in [place] valve functioning properly echocardiogram reviewed from the beginning of this year continue present management. Essential hypertension blood pressure slightly elevated today but she said at home it is always good.  Continue present management. Borderline diabetes as her hemoglobin A1c was 6.2 continue present management. Dyslipidemia I did review K PN which only her LDL of 60 HDL 42.  She is on 10 mg of Lipitor.  Cardiac catheterization 2019 normal coronaries. Status post colonoscopy and gastroscopy.  She was find to have some polyps.  Those were removed.  She is doing okay from that point  review the biggest difficulty she is facing right now is difficulty finding proper diet and this is more short of time which she talks to me about today.   Medication Adjustments/Labs and Tests Ordered: Current medicines are reviewed at length with the patient today.  Concerns regarding medicines are outlined above.  No orders of the defined types were placed in this encounter.  Medication changes: No orders of the defined types were placed in this encounter.   Signed, Park Liter, MD, [facility] 12/23/2021 8:49 AM    [place]

## 2021-12-25 ENCOUNTER — Ambulatory Visit: Payer: Medicaid Other | Admitting: Cardiology

## 2021-12-27 IMAGING — CT CT ABD-PELV W/ CM
2 of 5 series · 16 of 46 positions shown, 18 images · IV contrast (Omnipaque)
Comparison: None.

CLINICAL DATA: 2 month history of diarrhea and intermittent
abdominal pain.

EXAM:
CT ABDOMEN AND PELVIS WITH CONTRAST
TECHNIQUE: Multidetector CT imaging of the abdomen and pelvis was performed
using the standard protocol following bolus administration of
intravenous contrast.
CONTRAST:  100mL OMNIPAQUE IOHEXOL 300 MG/ML  SOLN

[Series 2: axial st · axial · 0.92mm/px · z∈[-482,-77]mm · 13 of 91 slices shown, 15 images]
[im 5/91  soft-tissue]
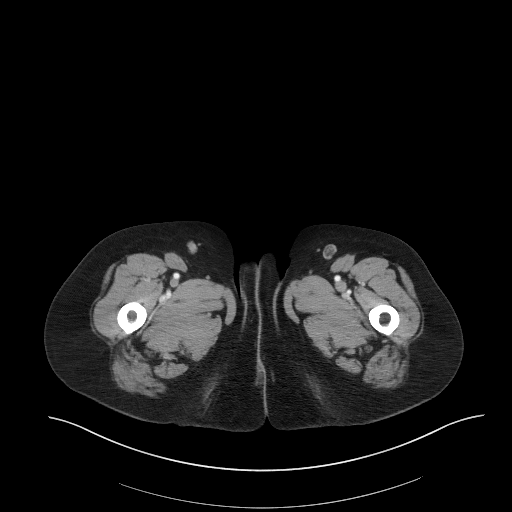
[im 5/91  bone]
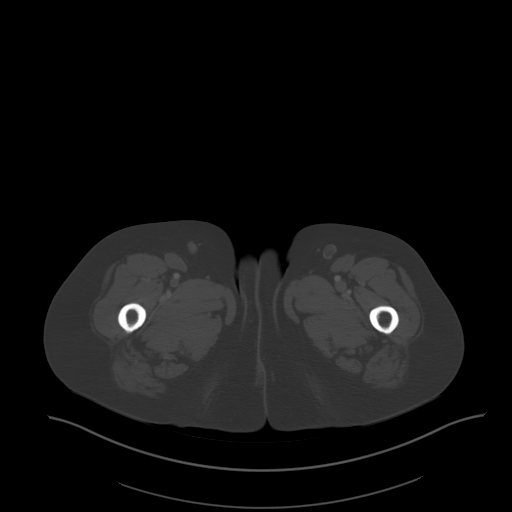
[im 15/91  soft-tissue]
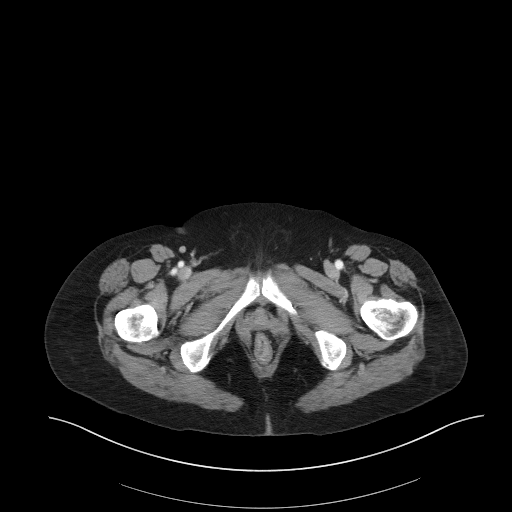
[im 19/91  soft-tissue]
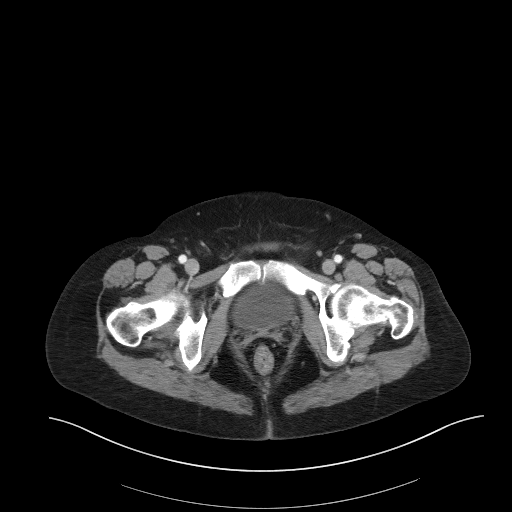
[im 24/91  soft-tissue]
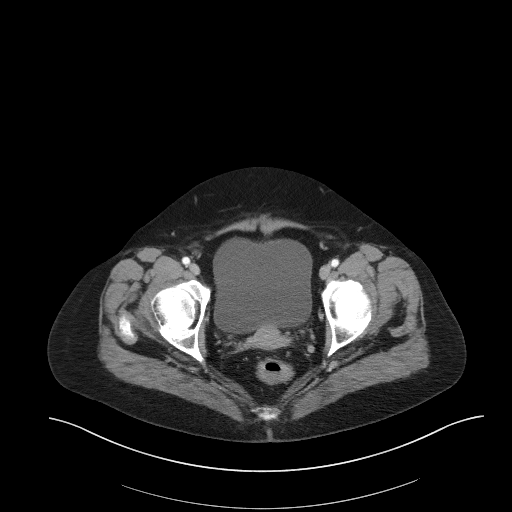
[im 34/91  soft-tissue]
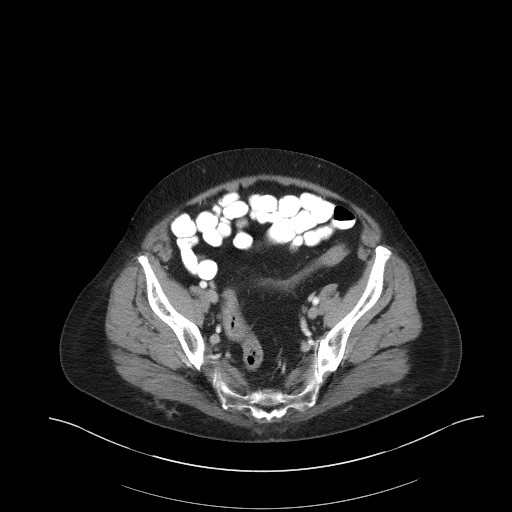
[im 38/91  soft-tissue]
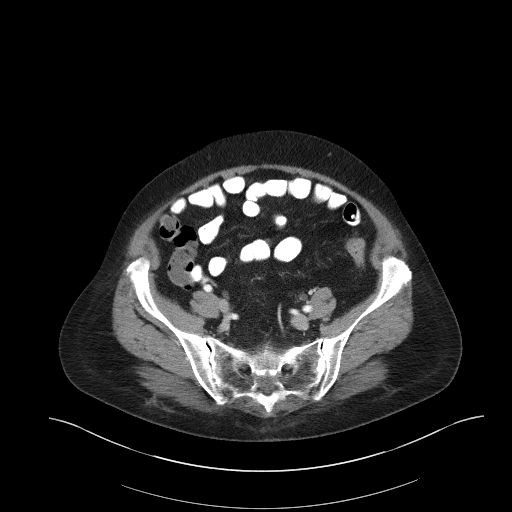
[im 48/91  soft-tissue]
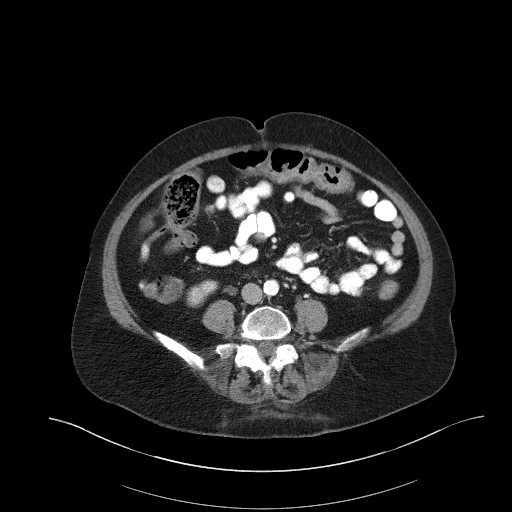
[im 53/91  soft-tissue]
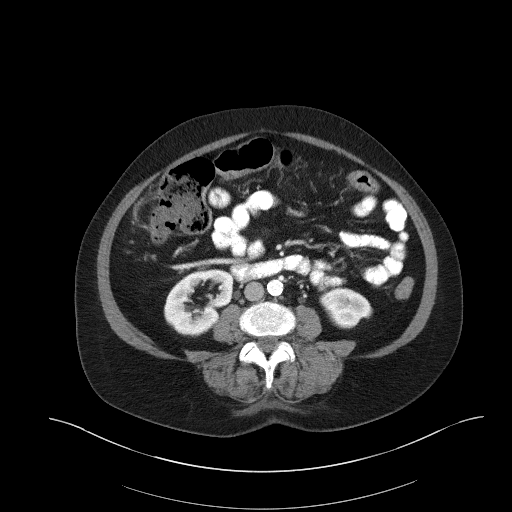
[im 57/91  soft-tissue]
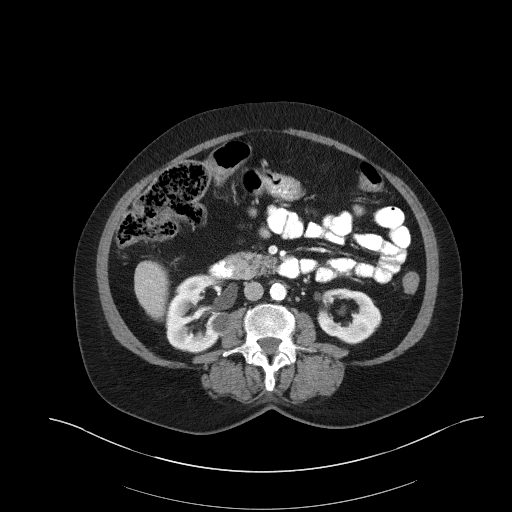
[im 57/91  bone]
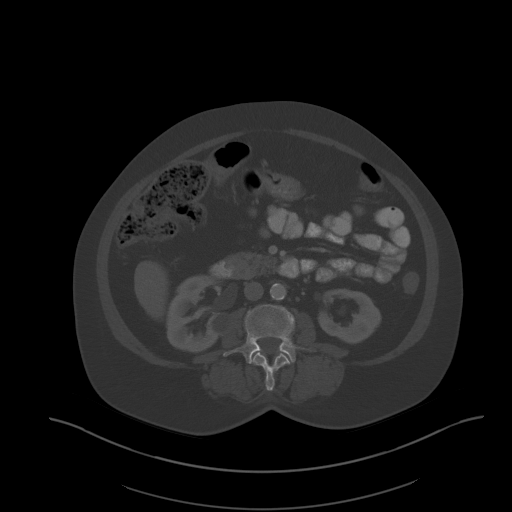
[im 67/91  soft-tissue]
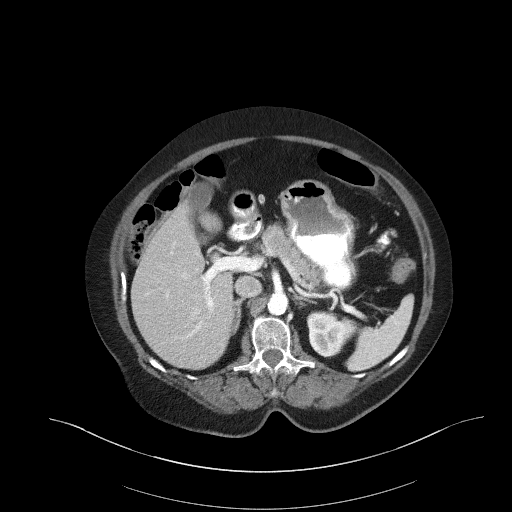
[im 72/91  soft-tissue]
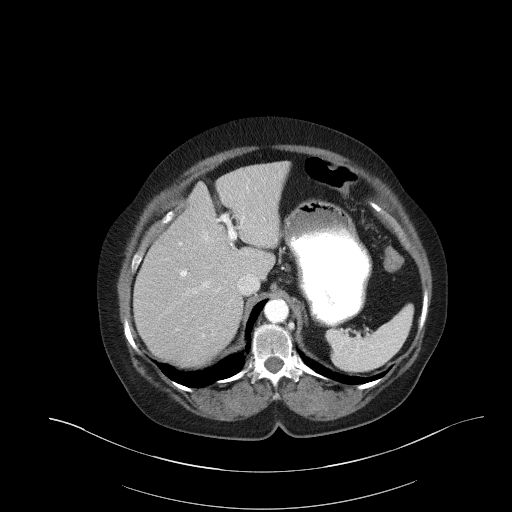
[im 76/91  soft-tissue]
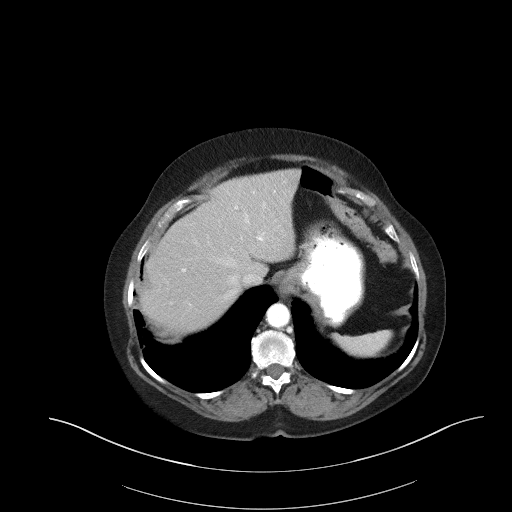
[im 86/91  soft-tissue]
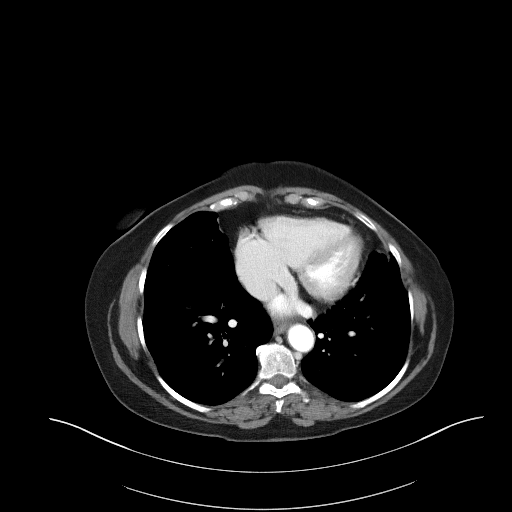

[Series 5: coronal st · coronal · 0.73mm/px · 3 of 105 slices shown]
[im 35/105  soft-tissue]
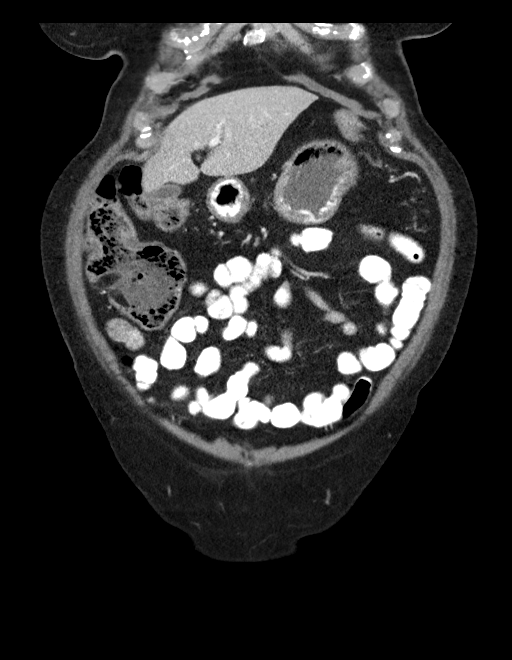
[im 47/105  soft-tissue]
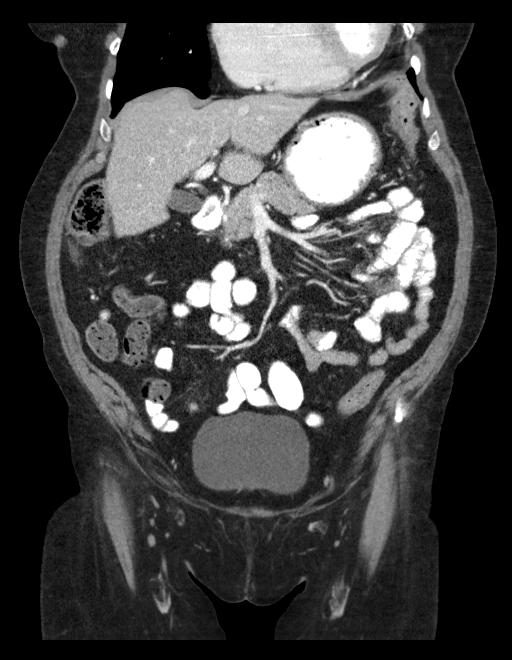
[im 58/105  soft-tissue]
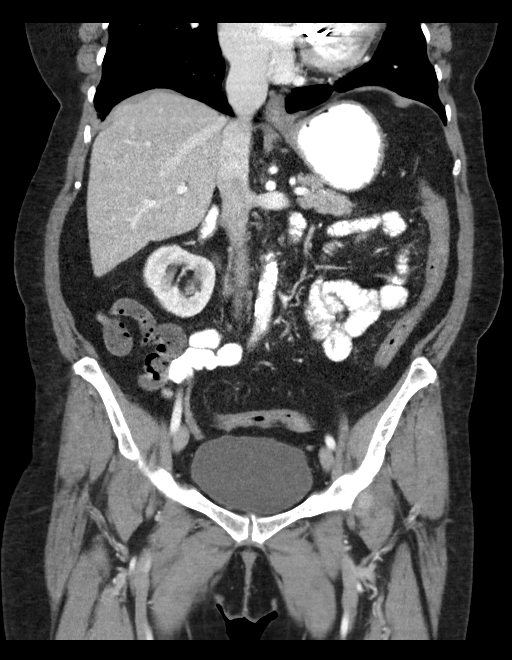

[16 of 46 positions shown; findings below may reference images not displayed]

FINDINGS: Lower chest: Status post mitral valve replacement.

Hepatobiliary: No suspicious focal abnormality within the liver
parenchyma. There is no evidence for gallstones, gallbladder wall
thickening, or pericholecystic fluid. No intrahepatic or
extrahepatic biliary dilation.

Pancreas: No focal mass lesion. No dilatation of the main duct. No
intraparenchymal cyst. No peripancreatic edema.

Spleen: No splenomegaly. No focal mass lesion.

Adrenals/Urinary Tract: No adrenal nodule or mass. 2 cm cyst
identified interpolar right kidney with a 5 mm low-density lesion
upper pole right kidney, too small to characterize but likely
benign. Left kidney unremarkable. No evidence for hydroureter. The
urinary bladder appears normal for the degree of distention.

Stomach/Bowel: Stomach is unremarkable. No gastric wall thickening.
No evidence of outlet obstruction. Duodenum is normally positioned
as is the ligament of Treitz. No small bowel wall thickening. No
small bowel dilatation. The terminal ileum is normal. The appendix
is normal. Colon is diffusely decompressed without appreciable wall
thickening or pericolonic edema/inflammation.

Vascular/Lymphatic: There is abdominal aortic atherosclerosis
without aneurysm. There is no gastrohepatic or hepatoduodenal
ligament lymphadenopathy. No retroperitoneal or mesenteric
lymphadenopathy. No pelvic sidewall lymphadenopathy.

Reproductive: The uterus is unremarkable.  There is no adnexal mass.

Other: No intraperitoneal free fluid.

Musculoskeletal: No worrisome lytic or sclerotic osseous
abnormality.
IMPRESSION: No acute findings in the abdomen or pelvis. Specifically, no
findings to explain the patient's history of diarrhea and
intermittent abdominal pain.

Aortic Atherosclerois (Q22G5-170.0)

## 2022-01-17 ENCOUNTER — Ambulatory Visit (INDEPENDENT_AMBULATORY_CARE_PROVIDER_SITE_OTHER)
Admission: RE | Admit: 2022-01-17 | Discharge: 2022-01-17 | Disposition: A | Discharge status: Home / Self Care | Payer: Medicaid Other | Source: Ambulatory Visit | Attending: Gastroenterology | Admitting: Gastroenterology

## 2022-01-17 ENCOUNTER — Encounter: Payer: Self-pay | Admitting: Gastroenterology

## 2022-01-17 ENCOUNTER — Ambulatory Visit (INDEPENDENT_AMBULATORY_CARE_PROVIDER_SITE_OTHER): Payer: Medicaid Other | Admitting: Gastroenterology

## 2022-01-17 ENCOUNTER — Other Ambulatory Visit (INDEPENDENT_AMBULATORY_CARE_PROVIDER_SITE_OTHER): Payer: Medicaid Other

## 2022-01-17 VITALS — BP 130/70 | HR 71 | Ht 63.0 in | Wt 140.5 lb

## 2022-01-17 DIAGNOSIS — Z800 Family history of malignant neoplasm of digestive organs: Secondary | ICD-10-CM

## 2022-01-17 DIAGNOSIS — R1084 Generalized abdominal pain: Secondary | ICD-10-CM

## 2022-01-17 DIAGNOSIS — R197 Diarrhea, unspecified: Secondary | ICD-10-CM

## 2022-01-17 LAB — CBC WITH DIFFERENTIAL/PLATELET
Basophils Absolute: 0.1 10*3/uL (ref 0.0–0.1)
Basophils Relative: 0.8 % (ref 0.0–3.0)
Eosinophils Absolute: 1.5 10*3/uL — ABNORMAL HIGH (ref 0.0–0.7)
Eosinophils Relative: 17.5 % — ABNORMAL HIGH (ref 0.0–5.0)
HCT: 36 % (ref 36.0–46.0)
Hemoglobin: 12 g/dL (ref 12.0–15.0)
Lymphocytes Relative: 20.4 % (ref 12.0–46.0)
Lymphs Abs: 1.7 10*3/uL (ref 0.7–4.0)
MCHC: 33.4 g/dL (ref 30.0–36.0)
MCV: 91.7 fl (ref 78.0–100.0)
Monocytes Absolute: 0.6 10*3/uL (ref 0.1–1.0)
Monocytes Relative: 7 % (ref 3.0–12.0)
Neutro Abs: 4.5 10*3/uL (ref 1.4–7.7)
Neutrophils Relative %: 54.3 % (ref 43.0–77.0)
Platelets: 292 10*3/uL (ref 150.0–400.0)
RBC: 3.93 Mil/uL (ref 3.87–5.11)
RDW: 13.5 % (ref 11.5–15.5)
WBC: 8.4 10*3/uL (ref 4.0–10.5)

## 2022-01-17 LAB — COMPREHENSIVE METABOLIC PANEL
ALT: 16 U/L (ref 0–35)
AST: 20 U/L (ref 0–37)
Albumin: 4.4 g/dL (ref 3.5–5.2)
Alkaline Phosphatase: 73 U/L (ref 39–117)
BUN: 24 mg/dL — ABNORMAL HIGH (ref 6–23)
CO2: 26 mEq/L (ref 19–32)
Calcium: 9.2 mg/dL (ref 8.4–10.5)
Chloride: 103 mEq/L (ref 96–112)
Creatinine, Ser: 0.77 mg/dL (ref 0.40–1.20)
GFR: 76.68 mL/min (ref >60.00)
Glucose, Bld: 145 mg/dL — ABNORMAL HIGH (ref 70–99)
Potassium: 4 mEq/L (ref 3.5–5.1)
Sodium: 136 mEq/L (ref 135–145)
Total Bilirubin: 0.6 mg/dL (ref 0.2–1.2)
Total Protein: 7.8 g/dL (ref 6.0–8.3)

## 2022-01-17 LAB — C-REACTIVE PROTEIN: CRP: <1 mg/dL (ref 0.5–20.0)

## 2022-01-17 MED ORDER — FAMOTIDINE 20 MG PO TABS: 20.0000 mg | ORAL_TABLET | Freq: Every day | ORAL | 2 refills | Status: DC

## 2022-01-17 NOTE — Patient Instructions (Addendum)
If you are age 73 or older, your body mass index should be between 23-30. Your Body mass index is 24.89 kg/m. If this is out of the aforementioned range listed, please consider follow up with your Primary Care Provider.  If you are age 36 or younger, your body mass index should be between 19-25. Your Body mass index is 24.89 kg/m. If this is out of the aformentioned range listed, please consider follow up with your Primary Care Provider.   ________________________________________________________  The [facility] providers would like to encourage you to use [facility] to communicate with providers for non-urgent requests or questions.  Due to long hold times on the telephone, sending your provider a message by [facility] may be a faster and more efficient way to get a response.  Please allow 48 business hours for a response.  Please remember that this is for non-urgent requests.  _______________________________________________________  Your provider has requested that you go to the basement level for lab work before leaving today. Press "B" on the elevator. The lab is located at the first door on the left as you exit the elevator.  Your provider has requested that you have an abdominal x ray before leaving today. Please go to the basement floor to our Radiology department for the test.  Take dexilant in the morning  We have sent the following medications to your pharmacy for you to pick up at your convenience: Pepcid at night  Please purchase the following medications over the counter and take as directed: Benefiber 1 tablespoon in [place] of water when constipated and do 1 tablespoon in 2oz of water when having diarrhea  Low FODMAP Diet: (Fermentable Oligosaccharides, Disaccharides, Monosaccharides, and Polyols) These are short chain carbohydrates and sugar alcohols that are poorly absorbed by the body, resulting in multiple abdominal symptoms, including changes in bowel habits, abdominal  pain/discomfort, bloating, abdominal distension, gas, etc.      Thank you,  Dr. Jackquline Denmark

## 2022-01-17 NOTE — Progress Notes (Signed)
Chief Complaint: For FU  Referring Provider: Dr. Sheral Apley      ASSESSMENT AND PLAN;   #1. IBS with alt constipation/diarrhea. Neg CT, EGD with Bx, colon with Bx #2. GERD with small HH #3. H/O colon polyps. #4. FH colon cancer  (dad at age 73)- but pt on coumadin for mech MVR.   Plan: - Check CBC, CMP, CRP - Fodmap diet - Change dexilant to QAM - Add Pepcid '20mg'$  po QHS - Add Benefiber 1 TBS with 8 oz water when constipated and with 2 oz when diarrhea. - X ray 2 view today to determine stool burden    HPI:    Maria Ingram is a 71 y.o. female  History through interpreter. With MVR ([employer]) on coumadin, HLD, HTN, borderline DM2, anxiety/depression  Alt constipation and then diarrhea. (Esp after MiraLAX)  With occasional mucus seepage.  No abdominal pain currently Does have abdominal bloating She had longstanding abdominal problems x over 25 yrs.  Neg CT Abdo/pelvis 11/2019 Negative EGD 11/2021 except for small hiatal hernia, neg SB Bx for celiac Negative colonoscopy 11/2021 after Lovenox bridging except for colonic polyps. Neg random colon biopsies.  No nausea or vomiting. There is no melena or hematochezia. No unintentional weight loss.    From previous notes: Had generalized abdominal pain especially left lower quadrant Had urinary tract infection a month ago, given p.o. trimethoprim. Thereafter started having more problems specially with diarrhea-currently complains of bowel movements at the frequency of 4-5 times per day.  She will occasionally have nocturnal symptoms as well.  No melena or hematochezia.  Very much distressed with symptoms.  Last dose of antibiotics was on 10/08/2019.  She had stool studies by Dr. Nyra Capes which was neg.    Past GI WU: CT AP with contrast 11/2019 No acute findings in the abdomen or pelvis. Specifically, no findings to explain the patient's history of diarrhea and intermittent abdominal pain. Aortic Atherosclerois  (ICD10-170.0)  Colon 11/2021 - Three 6 to 8 mm polyps in the proximal transverse colon and in the mid ascending colon, removed with a cold snare. Resected and retrieved. Bx-tubular adenomas - Four 8 to 12 mm polyps in the distal descending colon, removed with a hot snare. Resected and retrieved. Tattooed. Bx-tubular adenomas - Mild sigmoid diverticulosis. - Non-bleeding internal hemorrhoids. - The examination was otherwise normal on direct and retroflexion views. - Negative random colon biopsies except for focal acute colitis   EGD 11/2021 - Normal esophagus. - 2 cm hiatal hernia. - Normal examined duodenum. Bx- neg for celiac  Past Medical History:  Diagnosis Date   Abnormal stress test 02/11/2018   Mid and apical portion of the anterior wall ischemia and stress test in July 2019 Cardiac catheterization after that showing normal coronaries   Anemia 11/10/2019   Blood transfusion without reported diagnosis 1981   post delivery of child   Borderline diabetes 02/08/2018   Cataract    bilateral sx   Diabetes mellitus without complication ([place])    diet controlled- no meds   Dyslipidemia 02/08/2018   Essential hypertension 02/08/2018   GERD (gastroesophageal reflux disease)    H/O mitral valve replacement with mechanical valve 01/08/2015   Done in 2000, [facility]  Formatting of this note might be different from the original. Done in 2000, [facility]   History of mitral valve prosthesis 02/08/2018   Done many years ago in [place]   History of rheumatic heart disease    Hyperlipidemia  on meds   Hypertension    on meds   Osteoarthritis of left knee 09/13/2018   bilateral hands/legs   Pain in left knee 05/20/2018   Pre-diabetes    Borderline   Pre-operative clearance 02/08/2018   Psoriasis    Seasonal allergies     Past Surgical History:  Procedure Laterality Date   BIOPSY  11/18/2021   Procedure: BIOPSY;  Surgeon: Jackquline Denmark, MD;  Location: [facility];  Service:  Endoscopy;;  EGD and COLON   COLONOSCOPY WITH PROPOFOL N/A 11/18/2021   Procedure: COLONOSCOPY WITH PROPOFOL;  Surgeon: Jackquline Denmark, MD;  Location: [facility];  Service: Endoscopy;  Laterality: N/A;   ESOPHAGOGASTRODUODENOSCOPY (EGD) WITH PROPOFOL N/A 11/18/2021   Procedure: ESOPHAGOGASTRODUODENOSCOPY (EGD) WITH PROPOFOL;  Surgeon: Jackquline Denmark, MD;  [facility];  Service: Endoscopy;  Laterality: N/A;   KNEE ARTHROSCOPY W/ MENISCAL REPAIR Right 2018   LEFT HEART CATH AND CORONARY ANGIOGRAPHY N/A 02/16/2018   Procedure: LEFT HEART CATH AND CORONARY ANGIOGRAPHY;  Surgeon: Martinique, Peter M, MD;  Location: [facility];  Service: Cardiovascular;  Laterality: N/A;   POLYPECTOMY  11/18/2021   Procedure: POLYPECTOMY;  Surgeon: Jackquline Denmark, MD;  [facility];  Service: Endoscopy;;   SUBMUCOSAL TATTOO INJECTION  11/18/2021   Procedure: SUBMUCOSAL TATTOO INJECTION;  Surgeon: Jackquline Denmark, MD;  [facility];  Service: Endoscopy;;   VALVE REPLACEMENT     St Jude mitral valve prosthesis    Family History  Problem Relation Age of Onset   Colon polyps Father 17   Colon cancer Father 42   Esophageal cancer Neg Hx    Rectal cancer Neg Hx    Stomach cancer Neg Hx     Social History   Tobacco Use   Smoking status: Never   Smokeless tobacco: Never  Vaping Use   Vaping Use: Never used  Substance Use Topics   Alcohol use: Not Currently   Drug use: Never    Current Outpatient Medications  Medication Sig Dispense Refill   atorvastatin (LIPITOR) 10 MG tablet Take 10 mg by mouth daily.     citalopram (CELEXA) 10 MG tablet Take 10 mg by mouth daily.     dexlansoprazole (DEXILANT) 60 MG capsule Take 60 mg by mouth daily.     ENSTILAR 0.005-0.064 % FOAM Apply 1 application. topically 3 (three) times a week.     lisinopril (ZESTRIL) 20 MG tablet Take 20 mg by mouth daily.     loratadine (CLARITIN) 10 MG tablet Take 10 mg by mouth daily.     memantine (NAMENDA) 5  MG tablet Take 5 mg by mouth 2 (two) times daily.     SKYRIZI 150 MG/ML SOSY Inject 150 mg as directed every 3 (three) months.     triamcinolone cream (KENALOG) 0.5 % Apply 1 application. topically daily.     warfarin (COUMADIN) 1 MG tablet Take 1 mg by mouth See admin instructions. Take 1 mg by mouth daily except for Sunday     warfarin (COUMADIN) 3 MG tablet Take 3 mg by mouth daily.     memantine (NAMENDA) 10 MG tablet Take 1 tablet (10 mg total) by mouth 2 (two) times daily. 180 tablet 4   No current facility-administered medications for this visit.    No Known Allergies   Review of Systems:  neg     Physical Exam:    BP 130/70   Pulse 71   Ht '5\' 3"'$  (1.6 m)   Wt 140 lb  8 oz (63.7 kg)   SpO2 96%   BMI 24.89 kg/m  Filed Weights   01/17/22 1510  Weight: 140 lb 8 oz (63.7 kg)   Constitutional:  Well-developed, in no acute distress. Psychiatric: Normal mood and affect. Behavior is normal. HEENT: Pupils normal.  Conjunctivae are normal. No scleral icterus. Neck supple.  Cardiovascular: Normal rate, regular rhythm. No edema Pulmonary/chest: Effort normal and breath sounds normal. No wheezing, rales or rhonchi. Abdominal: Soft, nondistended, mildly bloated, generalized abdominal tenderness especially left lower quadrant.  No rebound.  Bowel sounds active throughout. There are no masses palpable. No hepatomegaly. Rectal:  defered Neurological: Alert and oriented to person place and time. Skin: Skin is warm and dry. No rashes noted. Have discussed in detail with the patient, patient's 2 daughters through the interpreter.  Carmell Austria, MD 01/17/2022, 3:11 PM  Cc: Dr. Sheral Apley

## 2022-01-20 ENCOUNTER — Telehealth: Payer: Self-pay | Admitting: Gastroenterology

## 2022-01-20 NOTE — Telephone Encounter (Signed)
Due to her insurance, I advised her to reach out to primary care Dr Nyra Capes regarding referral to allergist, unsure if her insurance requires an authorization. She tells me she will call that office today. Asked her to call back if she had any other questions, gave her my name and told her to ask for me.

## 2022-01-21 ENCOUNTER — Telehealth: Payer: Self-pay | Admitting: Gastroenterology

## 2022-01-21 NOTE — Telephone Encounter (Signed)
Spoke with pt daughter. Documented under results note:

## 2022-01-21 NOTE — Telephone Encounter (Signed)
Patients daughter returned your call, daughter stated she would like for you to call back around 4:00 with an interpreter. Please advise.

## 2022-03-21 ENCOUNTER — Ambulatory Visit: Payer: Medicaid Other | Admitting: Cardiology

## 2022-04-21 ENCOUNTER — Ambulatory Visit: Payer: Medicaid Other | Admitting: Allergy and Immunology

## 2022-04-21 ENCOUNTER — Encounter: Payer: Self-pay | Admitting: Allergy and Immunology

## 2022-04-21 ENCOUNTER — Other Ambulatory Visit: Payer: Self-pay | Admitting: Allergy and Immunology

## 2022-04-21 VITALS — BP 134/64 | HR 76 | Resp 14 | Ht 58.5 in | Wt 139.0 lb

## 2022-04-21 DIAGNOSIS — R198 Other specified symptoms and signs involving the digestive system and abdomen: Secondary | ICD-10-CM

## 2022-04-21 DIAGNOSIS — J3089 Other allergic rhinitis: Secondary | ICD-10-CM

## 2022-04-21 DIAGNOSIS — D7219 Other eosinophilia: Secondary | ICD-10-CM | POA: Diagnosis not present

## 2022-04-21 DIAGNOSIS — K219 Gastro-esophageal reflux disease without esophagitis: Secondary | ICD-10-CM | POA: Diagnosis not present

## 2022-04-21 DIAGNOSIS — R058 Other specified cough: Secondary | ICD-10-CM

## 2022-04-21 MED ORDER — COLESTIPOL HCL 1 G PO TABS: 1.0000 g | ORAL_TABLET | Freq: Every day | ORAL | 1 refills | Status: DC

## 2022-04-21 MED ORDER — LOSARTAN POTASSIUM 50 MG PO TABS
50.0000 mg | ORAL_TABLET | Freq: Every day | ORAL | 1 refills | Status: DC
Start: 2022-04-21 — End: 2022-06-04

## 2022-04-21 MED ORDER — MONTELUKAST SODIUM 10 MG PO TABS: 10.0000 mg | ORAL_TABLET | Freq: Every day | ORAL | 5 refills | Status: DC

## 2022-04-21 NOTE — Patient Instructions (Addendum)
  1.  Allergen avoidance measures  2.  Discontinue Claritin.  Start montelukast 10 mg 1 tablet 1 time per day  3.  Slowly taper off all forms of caffeine using decaffeinated espresso  4.  Change lisinopril to losartan 50 mg -1 time per day.  Check BP  5.  Start colestipol 1 g -1 tablet 1 time per day  6.  Blood - CBC w/d  7.  Return to clinic in 4 weeks or earlier if problem

## 2022-04-21 NOTE — Progress Notes (Unsigned)
[place]   Dear Maria Ingram,  Thank you for referring Maria Ingram to the [facility] on 04/21/2022.   Below is a summation of this patient's evaluation and recommendations.  Thank you for your referral. I will keep you informed about this patient's response to treatment.   If you have any questions please do not hesitate to contact me.   Sincerely,  Maria Prows, MD Allergy / Immunology [place]   ______________________________________________________________________    NEW PATIENT NOTE  Referring Provider: Jackquline Denmark, MD Primary Provider: Maryella Shivers, MD Date of office visit: 04/21/2022    Subjective:   Chief Complaint:  Maria Ingram (DOB: 23-Jan-1951) is a 71 y.o. female who presents to the clinic on 04/21/2022 with a chief complaint of Allergies and Cough .     HPI: Maria Ingram presents to this clinic in evaluation of several issues.  First, she has a combination of constipation and diarrhea and an issue with reflux.  She has had evaluation with Dr. Lyndel Ingram, GI, who is performed a colonoscopy and upper endoscopy which apparently is normal.  She describes some constipation where she will pass a small amount of stool and then she has diarrhea that can sometimes be watery.  This occurs several times per week.  It appears to occur whenever she develops a bowel movement.  She is been stuck in this pattern for the past 2 years.  In addition, she has lots of reflux with regurgitation even though she is using therapy for reflux.  There is not really an obvious provoking factor giving rise to these issues.  If she does drink lactose containing milk she will develop diarrhea.  Sometimes tomato sauce consumption precipitates this issue as well.  She does drink 2 caffeinated drinks 1 in the morning and 1 in the  afternoon.  Second, she has issues with sneezing and nasal congestion especially during the spring and fall.  She uses a Claritin every day of the year and she is not really sure that this helps this issue.  Third, she will develop coughing if she is exposed to strong smells such as perfumes.  She will develop this coughing spell that will go on for minutes until she removes herself away from that exposure.  She does not have any cold air induced bronchospastic symptoms or exercise-induced bronchospastic symptoms.  She may have had a history of childhood asthma very early in life.  Past Medical History:  Diagnosis Date   Abnormal stress test 02/11/2018   Mid and apical portion of the anterior wall ischemia and stress test in July 2019 Cardiac catheterization after that showing normal coronaries   Anemia 11/10/2019   Blood transfusion without reported diagnosis 1981   post delivery of child   Borderline diabetes 02/08/2018   Cataract    bilateral sx   Diabetes mellitus without complication ([place])    diet controlled- no meds   Dyslipidemia 02/08/2018   Essential hypertension 02/08/2018   GERD (gastroesophageal reflux disease)    H/O mitral valve replacement with mechanical valve 01/08/2015   Done in 2000, [facility]  Formatting of this note might be different from the original. Done in 2000, [facility]   History of mitral valve prosthesis 02/08/2018   Done many years ago in [place]   History of rheumatic heart disease    Hyperlipidemia    on meds  Hypertension    on meds   Osteoarthritis of left knee 09/13/2018   bilateral hands/legs   Pain in left knee 05/20/2018   Pre-diabetes    Borderline   Pre-operative clearance 02/08/2018   Psoriasis    Seasonal allergies     Past Surgical History:  Procedure Laterality Date   BIOPSY  11/18/2021   Procedure: BIOPSY;  Surgeon: Maria Denmark, MD;  [facility];  Service: Endoscopy;;  EGD and COLON   COLONOSCOPY WITH PROPOFOL N/A  11/18/2021   Procedure: COLONOSCOPY WITH PROPOFOL;  Surgeon: Maria Denmark, MD;  [facility];  Service: Endoscopy;  Laterality: N/A;   ESOPHAGOGASTRODUODENOSCOPY (EGD) WITH PROPOFOL N/A 11/18/2021   Procedure: ESOPHAGOGASTRODUODENOSCOPY (EGD) WITH PROPOFOL;  Surgeon: Maria Denmark, MD;  [facility];  Service: Endoscopy;  Laterality: N/A;   KNEE ARTHROSCOPY W/ MENISCAL REPAIR Right 2018   LEFT HEART CATH AND CORONARY ANGIOGRAPHY N/A 02/16/2018   Procedure: LEFT HEART CATH AND CORONARY ANGIOGRAPHY;  Surgeon: Martinique, Peter M, MD;  Location: [facility];  Service: Cardiovascular;  Laterality: N/A;   POLYPECTOMY  11/18/2021   Procedure: POLYPECTOMY;  Surgeon: Maria Denmark, MD;  [facility];  Service: Endoscopy;;   SUBMUCOSAL TATTOO INJECTION  11/18/2021   Procedure: SUBMUCOSAL TATTOO INJECTION;  Surgeon: Maria Denmark, MD;  [facility];  Service: Endoscopy;;   VALVE REPLACEMENT     St Jude mitral valve prosthesis    Allergies as of 04/21/2022   No Known Allergies      Medication List    atorvastatin 10 MG tablet Commonly known as: LIPITOR Take 10 mg by mouth daily.   citalopram 10 MG tablet Commonly known as: CELEXA Take 10 mg by mouth daily.   dexlansoprazole 60 MG capsule Commonly known as: DEXILANT Take 60 mg by mouth daily.   Enstilar 0.005-0.064 % Foam Generic drug: Calcipotriene-Betameth Diprop Apply 1 application. topically 3 (three) times a week.   famotidine 20 MG tablet Commonly known as: Pepcid Take 1 tablet (20 mg total) by mouth at bedtime.   lisinopril 20 MG tablet Commonly known as: ZESTRIL Take 20 mg by mouth daily.   loratadine 10 MG tablet Commonly known as: CLARITIN Take 10 mg by mouth daily.   memantine 5 MG tablet Commonly known as: NAMENDA Take 5 mg by mouth 2 (two) times daily.   memantine 10 MG tablet Commonly known as: Namenda Take 1 tablet (10 mg total) by mouth 2 (two) times daily.    Dexilant 60 MG capsule Take 40 mg by mouth daily.   Skyrizi 150 MG/ML Sosy Generic drug: Risankizumab-rzaa Inject 150 mg as directed every 3 (three) months.   terbinafine 250 MG tablet Commonly known as: LAMISIL Take 250 mg by mouth daily.   triamcinolone cream 0.5 % Commonly known as: KENALOG Apply 1 application. topically daily.   warfarin 3 MG tablet Commonly known as: COUMADIN Take 3 mg by mouth daily.   warfarin 1 MG tablet Commonly known as: COUMADIN Take 1 mg by mouth See admin instructions. Take 1 mg by mouth daily except for Sunday    Review of systems negative except as noted in HPI / PMHx or noted below:  Review of Systems  Constitutional: Negative.   HENT: Negative.    Eyes: Negative.   Respiratory: Negative.    Cardiovascular: Negative.   Gastrointestinal: Negative.   Genitourinary: Negative.   Musculoskeletal: Negative.   Skin: Negative.   Neurological: Negative.   Endo/Heme/Allergies: Negative.   Psychiatric/Behavioral: Negative.  Family History  Problem Relation Age of Onset   Emphysema Mother    Asthma Father    Colon polyps Father 91   Colon cancer Father 7   Emphysema Sister    Asthma Daughter    Asthma Son    Esophageal cancer Neg Hx    Rectal cancer Neg Hx    Stomach cancer Neg Hx    Immunodeficiency Neg Hx    Eczema Neg Hx    Atopy Neg Hx    Angioedema Neg Hx     Social History   Socioeconomic History   Marital status: Married    Spouse name: Not on file   Number of children: Not on file   Years of education: Not on file   Highest education level: Not on file  Occupational History   Not on file  Tobacco Use   Smoking status: Never    Passive exposure: Never   Smokeless tobacco: Never  Vaping Use   Vaping Use: Never used  Substance and Sexual Activity   Alcohol use: Not Currently   Drug use: Never   Sexual activity: Not on file  Other Topics Concern   Not on file  Social History Narrative   Not on file    Social Determinants of Health   Financial Resource Strain: Not on file  Food Insecurity: Not on file  Transportation Needs: Not on file  Physical Activity: Not on file  Stress: Not on file  Social Connections: Not on file  Intimate Partner Violence: Not on file    Environmental and Social history  Lives in a house with a dry environment, a dog located inside the household, no carpet in the bedroom, plastic on the bed, no plastic on the pillow, and no smoking ongoing with inside the household.  Objective:   Vitals:   04/21/22 0935  BP: 134/64  Pulse: 76  Resp: 14  SpO2: 95%   Height: 4' 10.5" (148.6 cm) Weight: 139 lb (63 kg)  Physical Exam Constitutional:      Appearance: She is not diaphoretic.  HENT:     Head: Normocephalic.     Right Ear: Tympanic membrane, ear canal and external ear normal.     Left Ear: Tympanic membrane, ear canal and external ear normal.     Nose: Nose normal. No mucosal edema or rhinorrhea.     Mouth/Throat:     Pharynx: Uvula midline. No oropharyngeal exudate.  Eyes:     Conjunctiva/sclera: Conjunctivae normal.  Neck:     Thyroid: No thyromegaly.     Trachea: Trachea normal. No tracheal tenderness or tracheal deviation.  Cardiovascular:     Rate and Rhythm: Normal rate and regular rhythm.     Heart sounds: Normal heart sounds, S1 normal and S2 normal. No murmur heard. Pulmonary:     Effort: No respiratory distress.     Breath sounds: Normal breath sounds. No stridor. No wheezing or rales.  Lymphadenopathy:     Head:     Right side of head: No tonsillar adenopathy.     Left side of head: No tonsillar adenopathy.     Cervical: No cervical adenopathy.  Skin:    Findings: No erythema or rash.     Nails: There is no clubbing.  Neurological:     Mental Status: She is alert.     Diagnostics: Allergy skin tests were performed.   Spirometry was performed and demonstrated an FEV1 of 1.25 @ 75 % of predicted. FEV1/FVC = 0.65  Results  of blood tests obtained 17 January 2022 identifies WBC 8.4, absolute eosinophil 1500, absolute lymphocyte 1700, hemoglobin 12.0, platelet 292, AST 20 U/L, ALT 16 U/L, total bilirubin 0.6 mg/DL,   Assessment and Plan:    1. Other eosinophilia   2. Alternating constipation and diarrhea   3. Gastroesophageal reflux disease, unspecified whether esophagitis present     Patient Instructions   1.  Allergen avoidance measures  2.  Discontinue Claritin.  Start montelukast 10 mg 1 tablet 1 time per day  3.  Slowly taper off all forms of caffeine using decaffeinated espresso  4.  Change lisinopril to losartan 50 mg -1 time per day.  Check BP  5.  Start colestipol 1 g -1 tablet 1 time per day  6.  Blood - CBC w/d  7.  Return to clinic in 4 weeks or earlier if problem   Maria Prows, MD Allergy / Immunology [facility]

## 2022-04-22 ENCOUNTER — Encounter: Payer: Self-pay | Admitting: Allergy and Immunology

## 2022-04-22 LAB — CBC WITH DIFFERENTIAL/PLATELET
Basophils Absolute: 0.1 10*3/uL (ref 0.0–0.2)
Basos: 1 %
EOS (ABSOLUTE): 1.4 10*3/uL — ABNORMAL HIGH (ref 0.0–0.4)
Eos: 20 %
Hematocrit: 38.1 % (ref 34.0–46.6)
Hemoglobin: 12.5 g/dL (ref 11.1–15.9)
Immature Grans (Abs): 0 10*3/uL (ref 0.0–0.1)
Immature Granulocytes: 0 %
Lymphocytes Absolute: 1.6 10*3/uL (ref 0.7–3.1)
Lymphs: 22 %
MCH: 30.4 pg (ref 26.6–33.0)
MCHC: 32.8 g/dL (ref 31.5–35.7)
MCV: 93 fL (ref 79–97)
Monocytes Absolute: 0.6 10*3/uL (ref 0.1–0.9)
Monocytes: 8 %
Neutrophils Absolute: 3.7 10*3/uL (ref 1.4–7.0)
Neutrophils: 49 %
Platelets: 299 10*3/uL (ref 150–450)
RBC: 4.11 x10E6/uL (ref 3.77–5.28)
RDW: 12.3 % (ref 11.7–15.4)
WBC: 7.4 10*3/uL (ref 3.4–10.8)

## 2022-05-01 NOTE — Addendum Note (Signed)
Addended by: Guy Franco on: 05/01/2022 04:37 PM   Modules accepted: Orders

## 2022-05-19 ENCOUNTER — Encounter: Payer: Self-pay | Admitting: Allergy and Immunology

## 2022-05-19 ENCOUNTER — Ambulatory Visit: Payer: Medicaid Other | Admitting: Allergy and Immunology

## 2022-05-19 VITALS — BP 140/82 | HR 96 | Resp 16

## 2022-05-19 DIAGNOSIS — R058 Other specified cough: Secondary | ICD-10-CM

## 2022-05-19 DIAGNOSIS — D7219 Other eosinophilia: Secondary | ICD-10-CM

## 2022-05-19 DIAGNOSIS — R198 Other specified symptoms and signs involving the digestive system and abdomen: Secondary | ICD-10-CM | POA: Diagnosis not present

## 2022-05-19 DIAGNOSIS — K219 Gastro-esophageal reflux disease without esophagitis: Secondary | ICD-10-CM

## 2022-05-19 DIAGNOSIS — J3089 Other allergic rhinitis: Secondary | ICD-10-CM

## 2022-05-19 NOTE — Progress Notes (Unsigned)
[facility]   Follow-up Note  Referring Provider: Maryella Shivers, MD Primary Provider: Maryella Shivers, MD Date of Office Visit: 05/19/2022  Subjective:   Maria Ingram (DOB: September 20, 1950) is a 71 y.o. female who returns to the [facility] on 05/19/2022 in re-evaluation of the following:  HPI: [name] returns to this clinic in evaluation of issues with alternating constipation and diarrhea, allergic rhinitis, reflux and cough and eosinophilia.  Her last visit to this clinic was her initial evaluation of 21 April 2022.  She has resolved her cough and she has resolved her throat clearing and she has resolved her phlegm in her throat.  She is no longer using lisinopril but is using losartan and her blood pressure has been okay.  She has resolved her alternating constipation and diarrhea while using colestipol.  Basically this is the best that she is felt with her upper airway and her abdominal issue over the course of the past 2 years.  She has not had any significant upper airway symptoms.  She has consolidated all of her caffeine consumption.  She has received the flu vaccine.  Allergies as of 05/19/2022   No Known Allergies      Medication List    atorvastatin 10 MG tablet Commonly known as: LIPITOR Take 10 mg by mouth daily.   citalopram 10 MG tablet Commonly known as: CELEXA Take 10 mg by mouth daily.   colestipol 1 g tablet Commonly known as: COLESTID Take 1 tablet (1 g total) by mouth daily.   dexlansoprazole 60 MG capsule Commonly known as: DEXILANT Take 60 mg by mouth daily.   Enstilar 0.005-0.064 % Foam Generic drug: Calcipotriene-Betameth Diprop Apply 1 application. topically 3 (three) times a week.   famotidine 20 MG tablet Commonly known as: Pepcid Take 1 tablet (20 mg total) by mouth at bedtime.   loratadine 10 MG tablet Commonly known as: CLARITIN Take 10 mg  by mouth daily.   losartan 50 MG tablet Commonly known as: Cozaar Take 1 tablet (50 mg total) by mouth daily.   memantine 5 MG tablet Commonly known as: NAMENDA Take 5 mg by mouth 2 (two) times daily.   memantine 10 MG tablet Commonly known as: Namenda Take 1 tablet (10 mg total) by mouth 2 (two) times daily.   montelukast 10 MG tablet Commonly known as: Singulair Take 1 tablet (10 mg total) by mouth at bedtime.   omeprazole 40 MG capsule Commonly known as: PRILOSEC Take 40 mg by mouth daily.   Skyrizi 150 MG/ML Sosy Generic drug: Risankizumab-rzaa Inject 150 mg as directed every 3 (three) months.   terbinafine 250 MG tablet Commonly known as: LAMISIL Take 250 mg by mouth daily.   triamcinolone cream 0.5 % Commonly known as: KENALOG Apply 1 application. topically daily.   warfarin 3 MG tablet Commonly known as: COUMADIN Take 3 mg by mouth daily.   warfarin 1 MG tablet Commonly known as: COUMADIN Take 1 mg by mouth See admin instructions. Take 1 mg by mouth daily except for Sunday    Past Medical History:  Diagnosis Date   Abnormal stress test 02/11/2018   Mid and apical portion of the anterior wall ischemia and stress test in July 2019 Cardiac catheterization after that showing normal coronaries   Anemia 11/10/2019   Blood transfusion without reported diagnosis 1981   post delivery of child   Borderline diabetes 02/08/2018   Cataract    bilateral sx  Diabetes mellitus without complication ([place])    diet controlled- no meds   Dyslipidemia 02/08/2018   Essential hypertension 02/08/2018   GERD (gastroesophageal reflux disease)    H/O mitral valve replacement with mechanical valve 01/08/2015   Done in 2000, [facility]  Formatting of this note might be different from the original. Done in 2000, [facility]   History of mitral valve prosthesis 02/08/2018   Done many years ago in [place]   History of rheumatic heart disease    Hyperlipidemia    on meds   Hypertension     on meds   Osteoarthritis of left knee 09/13/2018   bilateral hands/legs   Pain in left knee 05/20/2018   Pre-diabetes    Borderline   Pre-operative clearance 02/08/2018   Psoriasis    Seasonal allergies     Past Surgical History:  Procedure Laterality Date   BIOPSY  11/18/2021   Procedure: BIOPSY;  Surgeon: Jackquline Denmark, MD;  Location: [facility];  Service: Endoscopy;;  EGD and COLON   COLONOSCOPY WITH PROPOFOL N/A 11/18/2021   Procedure: COLONOSCOPY WITH PROPOFOL;  Surgeon: Jackquline Denmark, MD;  Location: [facility];  Service: Endoscopy;  Laterality: N/A;   ESOPHAGOGASTRODUODENOSCOPY (EGD) WITH PROPOFOL N/A 11/18/2021   Procedure: ESOPHAGOGASTRODUODENOSCOPY (EGD) WITH PROPOFOL;  Surgeon: Jackquline Denmark, MD;  [facility];  Service: Endoscopy;  Laterality: N/A;   KNEE ARTHROSCOPY W/ MENISCAL REPAIR Right 2018   LEFT HEART CATH AND CORONARY ANGIOGRAPHY N/A 02/16/2018   Procedure: LEFT HEART CATH AND CORONARY ANGIOGRAPHY;  Surgeon: Martinique, Peter M, MD;  Location: [facility];  Service: Cardiovascular;  Laterality: N/A;   POLYPECTOMY  11/18/2021   Procedure: POLYPECTOMY;  Surgeon: Jackquline Denmark, MD;  [facility];  Service: Endoscopy;;   SUBMUCOSAL TATTOO INJECTION  11/18/2021   Procedure: SUBMUCOSAL TATTOO INJECTION;  Surgeon: Jackquline Denmark, MD;  [facility]: [facility];  Service: Endoscopy;;   VALVE REPLACEMENT     St Jude mitral valve prosthesis    Review of systems negative except as noted in HPI / PMHx or noted below:  Review of Systems  Constitutional: Negative.   HENT: Negative.    Eyes: Negative.   Respiratory: Negative.    Cardiovascular: Negative.   Gastrointestinal: Negative.   Genitourinary: Negative.   Musculoskeletal: Negative.   Skin: Negative.   Neurological: Negative.   Endo/Heme/Allergies: Negative.   Psychiatric/Behavioral: Negative.       Objective:   There were no vitals filed for this visit.        Physical  Exam Constitutional:      Appearance: She is not diaphoretic.  HENT:     Head: Normocephalic.     Right Ear: Tympanic membrane, ear canal and external ear normal.     Left Ear: Tympanic membrane, ear canal and external ear normal.     Nose: Nose normal. No mucosal edema or rhinorrhea.     Mouth/Throat:     Pharynx: Uvula midline. No oropharyngeal exudate.  Eyes:     Conjunctiva/sclera: Conjunctivae normal.  Neck:     Thyroid: No thyromegaly.     Trachea: Trachea normal. No tracheal tenderness or tracheal deviation.  Cardiovascular:     Rate and Rhythm: Normal rate and regular rhythm.     Heart sounds: Normal heart sounds, S1 normal and S2 normal. No murmur heard. Pulmonary:     Effort: No respiratory distress.     Breath sounds: Normal breath sounds. No stridor. No wheezing or rales.  Lymphadenopathy:  Head:     Right side of head: No tonsillar adenopathy.     Left side of head: No tonsillar adenopathy.     Cervical: No cervical adenopathy.  Skin:    Findings: No erythema or rash.     Nails: There is no clubbing.  Neurological:     Mental Status: She is alert.     Diagnostics: Results of blood tests obtained 21 April 2022 identifies WBC 7.4, absolute eosinophil 1400, absolute lymphocyte 1600, hemoglobin 12.5, platelet 299  Assessment and Plan:   1. Alternating constipation and diarrhea   2. Perennial allergic rhinitis   3. Gastroesophageal reflux disease, unspecified whether esophagitis present   4. Other cough   5. Other eosinophilia    1.  Allergen avoidance measures -cockroach  2.  Continue montelukast 10 mg 1 tablet 1 time per day  3.  Continue losartan 50 mg -1 time per day.    4.  Continue colestipol 1 g -1 tablet 1 time per day  5.  Return to clinic in 12 weeks or earlier if problem.  Taper medications???  6.  Obtain RSV vaccine  Alejandria is doing much better at this point time with her chronic cough since we have eliminated her ACE inhibitor  and had her consolidate all caffeine consumption, her upper airway issue while using montelukast, and her alternating constipation and diarrhea while consistently using colestipol.  Her eosinophilia appears to be stable and we are not going to have her undergo any further evaluation for this issue unless of course this level increases or she demonstrates endorgan disease associated with eosinophilia.  I will see her back in this clinic in 12 weeks or earlier if there is a problem.  At that point in time there may be an opportunity to consolidate her medical treatment.  Allena Katz, MD Allergy / Immunology [place]

## 2022-05-19 NOTE — Patient Instructions (Signed)
  1.  Allergen avoidance measures -cockroach  2.  Continue montelukast 10 mg 1 tablet 1 time per day  3.  Continue losartan 50 mg -1 time per day.    4.  Continue colestipol 1 g -1 tablet 1 time per day  5.  Return to clinic in 12 weeks or earlier if problem.  Taper medications???  6.  Obtain RSV vaccine

## 2022-05-20 ENCOUNTER — Encounter: Payer: Self-pay | Admitting: Allergy and Immunology

## 2022-05-22 ENCOUNTER — Other Ambulatory Visit: Payer: Self-pay | Admitting: *Deleted

## 2022-05-22 MED ORDER — COLESTIPOL HCL 1 G PO TABS: 1.0000 g | ORAL_TABLET | Freq: Every day | ORAL | 1 refills | Status: DC

## 2022-06-04 ENCOUNTER — Other Ambulatory Visit: Payer: Self-pay

## 2022-06-04 MED ORDER — LOSARTAN POTASSIUM 50 MG PO TABS: 50.0000 mg | ORAL_TABLET | Freq: Every day | ORAL | 2 refills | Status: DC

## 2022-06-30 ENCOUNTER — Encounter: Payer: Self-pay | Admitting: Neurology

## 2022-06-30 ENCOUNTER — Ambulatory Visit (INDEPENDENT_AMBULATORY_CARE_PROVIDER_SITE_OTHER): Payer: Medicaid Other | Admitting: Neurology

## 2022-06-30 VITALS — BP 125/72 | HR 80 | Ht <= 58 in | Wt 141.0 lb

## 2022-06-30 DIAGNOSIS — F02A0 Dementia in other diseases classified elsewhere, mild, without behavioral disturbance, psychotic disturbance, mood disturbance, and anxiety: Secondary | ICD-10-CM

## 2022-06-30 DIAGNOSIS — G309 Alzheimer's disease, unspecified: Secondary | ICD-10-CM | POA: Diagnosis not present

## 2022-06-30 NOTE — Progress Notes (Signed)
[facility]  PATIENT: Maria Ingram DOB: 03-15-1951  REQUESTING CLINICIAN: Maryella Shivers, MD HISTORY FROM: Patient, and daughter via interpretor [place] VISIT: Memory decline    HISTORICAL  CHIEF COMPLAINT:  Chief Complaint  Patient presents with   Follow-up    Rm 12. Accompanied by daughter. Memory f/u.   INTERVAL HISTORY 06/30/22 Patient presents today for follow-up, she is accompanied by her daughter and translator Mickel Baas.  Daughter reports memory still a problem.  She still cooks but has to be careful on the stove.  Patient reports sometimes she will turn the stove on and then go take a shower, she will forget.  She has burned things.  Daughter also noted that patient is forgetful about recent conversation, she does ask the same questions. She does live with her husband but daughter is next-door and she comes in every day, she helps with her medication, her bills, shopping and also drives around.    HISTORY OF PRESENT ILLNESS:  This is a 71 year old woman with past medical history of mitral valve replacement, on Coumadin, hyperlipidemia, depression who is presenting with memory decline for the past 3 years.  Per daughter, patient has been having memory decline for the past 3 years described as confused, sometimes she is confused when going to the store, confused when leaving the house, does not know if she is leaving or coming back.  She also have issue with recent conversations.  She has been started on Namenda 5 mg twice daily without much improvement per daughter.  She is able to bathe herself, clean herself, she can ambulate without a cane, denies any recent fall.  She did burn a lot of things when cooking, of sometimes forget things in the microwave.  Per daughter there is a lot of instances where she will tell patient something and the next thing patient will repeat that she was never told about it.  She does not have issue with  family members name. Per patient she recalled that her memory is not very good, that a lot of thing that her daughter tells her, she will forget.  She reports mild issue with sleep, take a long time to go to sleep and sometimes she has nightmares. She also reports being easily distracted.  Because of her memory and being in [place] she had issues with sadness and depression.  Currently she is on Celexa 10 mg daily.   OTHER MEDICAL CONDITIONS: Mitral valve replacement on coumadin, HLD, Depression    REVIEW OF SYSTEMS: Full 14 system review of systems performed and negative with exception of: as noted in the HPI.   ALLERGIES: No Known Allergies  HOME MEDICATIONS: Outpatient Medications Prior to Visit  Medication Sig Dispense Refill   citalopram (CELEXA) 10 MG tablet Take 10 mg by mouth daily.     colestipol (COLESTID) 1 g tablet Take 1 tablet (1 g total) by mouth daily. 30 tablet 1   dexlansoprazole (DEXILANT) 60 MG capsule Take 60 mg by mouth daily.     ENSTILAR 0.005-0.064 % FOAM Apply 1 application. topically 3 (three) times a week.     famotidine (PEPCID) 20 MG tablet Take 1 tablet (20 mg total) by mouth at bedtime. 90 tablet 2   loratadine (CLARITIN) 10 MG tablet Take 10 mg by mouth daily.     losartan (COZAAR) 50 MG tablet Take 1 tablet (50 mg total) by mouth daily. 30 tablet 2   memantine (NAMENDA) 10 MG tablet Take 1 tablet (  10 mg total) by mouth 2 (two) times daily. 180 tablet 4   montelukast (SINGULAIR) 10 MG tablet Take 1 tablet (10 mg total) by mouth at bedtime. 30 tablet 5   omeprazole (PRILOSEC) 40 MG capsule Take 40 mg by mouth daily.     SKYRIZI 150 MG/ML SOSY Inject 150 mg as directed every 3 (three) months.     terbinafine (LAMISIL) 250 MG tablet Take 250 mg by mouth daily.     triamcinolone cream (KENALOG) 0.5 % Apply 1 application. topically daily.     warfarin (COUMADIN) 1 MG tablet Take 1 mg by mouth See admin instructions. Take 1 mg by mouth daily except for Sunday      warfarin (COUMADIN) 3 MG tablet Take 3 mg by mouth daily.     atorvastatin (LIPITOR) 10 MG tablet Take 10 mg by mouth daily.     memantine (NAMENDA) 5 MG tablet Take 5 mg by mouth 2 (two) times daily.     No facility-administered medications prior to visit.    PAST MEDICAL HISTORY: Past Medical History:  Diagnosis Date   Abnormal stress test 02/11/2018   Mid and apical portion of the anterior wall ischemia and stress test in July 2019 Cardiac catheterization after that showing normal coronaries   Anemia 11/10/2019   Blood transfusion without reported diagnosis 1981   post delivery of child   Borderline diabetes 02/08/2018   Cataract    bilateral sx   Diabetes mellitus without complication ([place])    diet controlled- no meds   Dyslipidemia 02/08/2018   Essential hypertension 02/08/2018   GERD (gastroesophageal reflux disease)    H/O mitral valve replacement with mechanical valve 01/08/2015   Done in 2000, [facility]  Formatting of this note might be different from the original. Done in 2000, [facility]   History of mitral valve prosthesis 02/08/2018   Done many years ago in [place]   History of rheumatic heart disease    Hyperlipidemia    on meds   Hypertension    on meds   Osteoarthritis of left knee 09/13/2018   bilateral hands/legs   Pain in left knee 05/20/2018   Pre-diabetes    Borderline   Pre-operative clearance 02/08/2018   Psoriasis    Seasonal allergies     PAST SURGICAL HISTORY: Past Surgical History:  Procedure Laterality Date   BIOPSY  11/18/2021   Procedure: BIOPSY;  Surgeon: Jackquline Denmark, MD;  [facility];  Service: Endoscopy;;  EGD and COLON   COLONOSCOPY WITH PROPOFOL N/A 11/18/2021   Procedure: COLONOSCOPY WITH PROPOFOL;  Surgeon: Jackquline Denmark, MD;  [facility];  Service: Endoscopy;  Laterality: N/A;   CORNEA LACERATION REPAIR Bilateral    ESOPHAGOGASTRODUODENOSCOPY (EGD) WITH PROPOFOL N/A 11/18/2021   Procedure:  ESOPHAGOGASTRODUODENOSCOPY (EGD) WITH PROPOFOL;  Surgeon: Jackquline Denmark, MD;  [facility];  Service: Endoscopy;  Laterality: N/A;   KNEE ARTHROSCOPY W/ MENISCAL REPAIR Right 2018   LEFT HEART CATH AND CORONARY ANGIOGRAPHY N/A 02/16/2018   Procedure: LEFT HEART CATH AND CORONARY ANGIOGRAPHY;  Surgeon: Martinique, Peter M, MD;  Location: [facility];  Service: Cardiovascular;  Laterality: N/A;   POLYPECTOMY  11/18/2021   Procedure: POLYPECTOMY;  Surgeon: Jackquline Denmark, MD;  [facility];  Service: Endoscopy;;   SUBMUCOSAL TATTOO INJECTION  11/18/2021   Procedure: SUBMUCOSAL TATTOO INJECTION;  Surgeon: Jackquline Denmark, MD;  [facility];  Service: Endoscopy;;   VALVE REPLACEMENT     St Jude mitral valve prosthesis  FAMILY HISTORY: Family History  Problem Relation Age of Onset   Emphysema Mother    Asthma Father    Colon polyps Father 42   Colon cancer Father 86   Emphysema Sister    Asthma Daughter    Asthma Son    Esophageal cancer Neg Hx    Rectal cancer Neg Hx    Stomach cancer Neg Hx    Immunodeficiency Neg Hx    Eczema Neg Hx    Atopy Neg Hx    Angioedema Neg Hx     SOCIAL HISTORY: Social History   Socioeconomic History   Marital status: Married    Spouse name: Not on file   Number of children: Not on file   Years of education: Not on file   Highest education level: Not on file  Occupational History   Not on file  Tobacco Use   Smoking status: Never    Passive exposure: Never   Smokeless tobacco: Never  Vaping Use   Vaping Use: Never used  Substance and Sexual Activity   Alcohol use: Not Currently   Drug use: Never   Sexual activity: Not on file  Other Topics Concern   Not on file  Social History Narrative   Not on file   Social Determinants of Health   Financial Resource Strain: Not on file  Food Insecurity: Not on file  Transportation Needs: Not on file  Physical Activity: Not on file  Stress: Not on file  Social  Connections: Not on file  Intimate Partner Violence: Not on file    PHYSICAL EXAM  GENERAL EXAM/CONSTITUTIONAL: Vitals:  Vitals:   06/30/22 0926  BP: 125/72  Pulse: 80  Weight: 141 lb (64 kg)  Height: '4\' 10"'$  (1.473 m)    Body mass index is 29.47 kg/m. Wt Readings from Last 3 Encounters:  06/30/22 141 lb (64 kg)  04/21/22 139 lb (63 kg)  01/17/22 140 lb 8 oz (63.7 kg)   Patient is in no distress; well developed, nourished and groomed; neck is supple  CARDIOVASCULAR: Examination of carotid arteries is normal; no carotid bruits Regular rate and rhythm, no murmurs Examination of peripheral vascular system by observation and palpation is normal  EYES: Pupils round and reactive to light, Visual fields full to confrontation, Extraocular movements intacts,   MUSCULOSKELETAL: Gait, strength, tone, movements noted in Neurologic exam below  NEUROLOGIC: MENTAL STATUS:     06/25/2021    8:46 AM  MMSE - Mini Mental State Exam  Orientation to time 1  Orientation to Place 1  Registration 3  Attention/ Calculation 0  Recall 2  Language- name 2 objects 2  Language- repeat 0  Language- repeat-comments no spanish translation available  Language- follow 3 step command 3  Language- read & follow direction 1  Write a sentence 1  Copy design 0  Total score 14   awake, alert, oriented to person, place and time  CRANIAL NERVE:  2nd, 3rd, 4th, 6th -visual fields full to confrontation, extraocular muscles intact, no nystagmus 5th - facial sensation symmetric 7th - facial strength symmetric 8th - hearing intact 9th - palate elevates symmetrically, uvula midline 11th - shoulder shrug symmetric 12th - tongue protrusion midline  MOTOR:  normal bulk and tone, full strength in the BUE, BLE  SENSORY:  normal and symmetric to light touch, pinprick, temperature, vibration  COORDINATION:  finger-nose-finger, fine finger movements normal  REFLEXES:  deep tendon reflexes present  and symmetric  GAIT/STATION:  normal  DIAGNOSTIC DATA (LABS, IMAGING, TESTING) - I reviewed patient records, labs, notes, testing and imaging myself where available.  Lab Results  Component Value Date   WBC 7.4 04/21/2022   HGB 12.5 04/21/2022   HCT 38.1 04/21/2022   MCV 93 04/21/2022   PLT 299 04/21/2022      Component Value Date/Time   NA 136 01/17/2022 1542   NA 137 03/07/2020 0828   K 4.0 01/17/2022 1542   CL 103 01/17/2022 1542   CO2 26 01/17/2022 1542   GLUCOSE 145 (H) 01/17/2022 1542   BUN 24 (H) 01/17/2022 1542   BUN 20 03/07/2020 0828   CREATININE 0.77 01/17/2022 1542   CALCIUM 9.2 01/17/2022 1542   PROT 7.8 01/17/2022 1542   ALBUMIN 4.4 01/17/2022 1542   AST 20 01/17/2022 1542   ALT 16 01/17/2022 1542   ALKPHOS 73 01/17/2022 1542   BILITOT 0.6 01/17/2022 1542   GFRNONAA 74 03/07/2020 0828   GFRAA 86 03/07/2020 0828   No results found for: "CHOL", "HDL", "LDLCALC", "LDLDIRECT", "TRIG", "CHOLHDL" No results found for: "HGBA1C" Lab Results  Component Value Date   [id number] 701 06/25/2021   Lab Results  Component Value Date   TSH 1.960 06/25/2021    CT Head 07/31/22 1.  The brain appears normal for age.  Specifically, there is no atrophy or hypodense changes. 2.  16 to 17 mm rounded mass with peripheral calcification near the right sphenoid wing.  This could represent a calcified meningioma or a large calcified aneurysm.  Recommend MRI of the brain with and without contrast and intracranial MR angiogram to further characterize.   ASSESSMENT AND PLAN  71 y.o. year old female with past medical history of aortic valve replacement, on Coumadin, hyperlipidemia and depression who is presenting for follow up for her Dementia.  Overall she is stable, she is on Namenda 10 mg twice daily.  We discussed adding Aricept but due to side effect of vivid dream, diarrhea, she declined.  Will continue her on the same medication, and I will see her in 1 year for  follow-up or sooner if worse.  She voices understanding.   1. 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    No orders of the defined types were placed in this encounter.    Return in about 1 year (around 07/01/2023).  I have spent a total of 44 minutes dedicated to this patient today, preparing to see patient, performing a medically appropriate examination and evaluation, ordering tests and/or medications and procedures, and counseling and educating the patient/family/caregiver; independently interpreting result and communicating results to the family/patient/caregiver; and documenting clinical information in the electronic medical record.   Alric Ran, MD 06/30/2022, 5:34 PM  [facility] [address] [place], [place] [address] [phone]

## 2022-07-02 ENCOUNTER — Telehealth: Payer: Self-pay | Admitting: Neurology

## 2022-07-02 MED ORDER — MEMANTINE HCL 10 MG PO TABS: 10.0000 mg | ORAL_TABLET | Freq: Two times a day (BID) | ORAL | 2 refills | Status: DC

## 2022-07-02 NOTE — Telephone Encounter (Signed)
Pharmacy called with pt stating that they are wanting to know the update on the request that was sent to the office for the pt's memantine (NAMENDA) 10 MG tablet (Expired) Please advise.

## 2022-07-02 NOTE — Telephone Encounter (Signed)
Refill for rx sent as requested.

## 2022-07-03 ENCOUNTER — Telehealth: Payer: Self-pay

## 2022-07-03 NOTE — Telephone Encounter (Signed)
Medical Certification for Disability Exceptions pw received for pt's [place] citizenship.   Pw placed on MD's desk for review/signature if appropriate.

## 2022-07-09 ENCOUNTER — Telehealth: Payer: Self-pay | Admitting: *Deleted

## 2022-07-09 NOTE — Telephone Encounter (Signed)
Pt USCIS form N [phone] @ front desk for p/u

## 2022-07-21 ENCOUNTER — Other Ambulatory Visit: Payer: Self-pay | Admitting: Allergy and Immunology

## 2022-08-18 ENCOUNTER — Encounter: Payer: Self-pay | Admitting: Allergy and Immunology

## 2022-08-18 ENCOUNTER — Ambulatory Visit (INDEPENDENT_AMBULATORY_CARE_PROVIDER_SITE_OTHER): Payer: Medicaid Other | Admitting: Allergy and Immunology

## 2022-08-18 VITALS — BP 140/68 | HR 80 | Temp 98.0°F | Resp 16

## 2022-08-18 DIAGNOSIS — R198 Other specified symptoms and signs involving the digestive system and abdomen: Secondary | ICD-10-CM | POA: Diagnosis not present

## 2022-08-18 DIAGNOSIS — K219 Gastro-esophageal reflux disease without esophagitis: Secondary | ICD-10-CM

## 2022-08-18 DIAGNOSIS — J3089 Other allergic rhinitis: Secondary | ICD-10-CM

## 2022-08-18 DIAGNOSIS — D7219 Other eosinophilia: Secondary | ICD-10-CM

## 2022-08-18 NOTE — Progress Notes (Signed)
[place]   Follow-up Note  Referring Provider: Maryella Shivers, MD Primary Provider: Maryella Shivers, MD Date of Office Visit: 08/18/2022  Subjective:   Maria Ingram (DOB: [birth year]) is a 72 y.o. female who returns to the [facility] on 08/18/2022 in re-evaluation of the following:  HPI: Wyolene returns to this clinic in evaluation of allergic rhinitis, reflux, history of cough, history of eosinophilia, history of constipation/diarrhea.  I last saw her in this clinic 19 May 2022.  She has had no problems with her nose and no problems with cough and she is very pleased about the response she has received while utilizing this form of therapy.  She remains away from all caffeine consumption and continues to treat reflux as previously prescribed.  She still occasionally has issues with the alternating constipation and diarrhea but she thinks that might be somewhat better while using colestipol.  Her blood pressure is under very good control since she discontinued her lisinopril and remains on losartan.  She has not had any new medical developments since her last visit.  Allergies as of 08/18/2022   No Known Allergies      Medication List    atorvastatin 10 MG tablet Commonly known as: LIPITOR Take 10 mg by mouth daily.   citalopram 10 MG tablet Commonly known as: CELEXA Take 10 mg by mouth daily.   colestipol 1 g tablet Commonly known as: COLESTID Take 1 tablet (1 g total) by mouth daily.   dexlansoprazole 60 MG capsule Commonly known as: DEXILANT Take 60 mg by mouth daily.   Enstilar 0.005-0.064 % Foam Generic drug: Calcipotriene-Betameth Diprop Apply 1 application. topically 3 (three) times a week.   famotidine 20 MG tablet Commonly known as: Pepcid Take 1 tablet (20 mg total) by mouth at bedtime.   loratadine 10 MG tablet Commonly known as: CLARITIN Take 10 mg by mouth  daily.   losartan 50 MG tablet Commonly known as: Cozaar Take 1 tablet (50 mg total) by mouth daily.   memantine 10 MG tablet Commonly known as: Namenda Take 1 tablet (10 mg total) by mouth 2 (two) times daily.   montelukast 10 MG tablet Commonly known as: Singulair Take 1 tablet (10 mg total) by mouth at bedtime.   Skyrizi 150 MG/ML Sosy Generic drug: Risankizumab-rzaa Inject 150 mg as directed every 3 (three) months.   terbinafine 250 MG tablet Commonly known as: LAMISIL Take 250 mg by mouth daily.   triamcinolone cream 0.5 % Commonly known as: KENALOG Apply 1 application. topically daily.   warfarin 3 MG tablet Commonly known as: COUMADIN Take 3 mg by mouth daily.   warfarin 1 MG tablet Commonly known as: COUMADIN Take 1 mg by mouth See admin instructions. Take 1 mg by mouth daily except for Sunday     Past Medical History:  Diagnosis Date   Abnormal stress test 02/11/2018   Mid and apical portion of the anterior wall ischemia and stress test in July 2019 Cardiac catheterization after that showing normal coronaries   Anemia 11/10/2019   Blood transfusion without reported diagnosis 1981   post delivery of child   Borderline diabetes 02/08/2018   Cataract    bilateral sx   Diabetes mellitus without complication ([place])    diet controlled- no meds   Dyslipidemia 02/08/2018   Essential hypertension 02/08/2018   GERD (gastroesophageal reflux disease)    H/O mitral valve replacement with mechanical valve 01/08/2015  Done in 2000, [facility]  Formatting of this note might be different from the original. Done in [facility], [facility]   History of mitral valve prosthesis 02/08/2018   Done many years ago in [place]   History of rheumatic heart disease    Hyperlipidemia    on meds   Hypertension    on meds   Osteoarthritis of left knee 09/13/2018   bilateral hands/legs   Pain in left knee 05/20/2018   Pre-diabetes    Borderline   Pre-operative clearance 02/08/2018    Psoriasis    Seasonal allergies     Past Surgical History:  Procedure Laterality Date   BIOPSY  11/18/2021   Procedure: BIOPSY;  Surgeon: Jackquline Denmark, MD;  [facility]: [facility];  Service: Endoscopy;;  EGD and COLON   COLONOSCOPY WITH PROPOFOL N/A 11/18/2021   Procedure: COLONOSCOPY WITH PROPOFOL;  Surgeon: Jackquline Denmark, MD;  Location: [facility];  Service: Endoscopy;  Laterality: N/A;   CORNEA LACERATION REPAIR Bilateral    ESOPHAGOGASTRODUODENOSCOPY (EGD) WITH PROPOFOL N/A 11/18/2021   Procedure: ESOPHAGOGASTRODUODENOSCOPY (EGD) WITH PROPOFOL;  Surgeon: Jackquline Denmark, MD;  [facility];  Service: Endoscopy;  Laterality: N/A;   KNEE ARTHROSCOPY W/ MENISCAL REPAIR Right 2018   LEFT HEART CATH AND CORONARY ANGIOGRAPHY N/A 02/16/2018   Procedure: LEFT HEART CATH AND CORONARY ANGIOGRAPHY;  Surgeon: Martinique, Peter M, MD;  Location: [facility];  Service: Cardiovascular;  Laterality: N/A;   POLYPECTOMY  11/18/2021   Procedure: POLYPECTOMY;  Surgeon: Jackquline Denmark, MD;  [facility];  Service: Endoscopy;;   SUBMUCOSAL TATTOO INJECTION  11/18/2021   Procedure: SUBMUCOSAL TATTOO INJECTION;  Surgeon: Jackquline Denmark, MD;  [facility];  Service: Endoscopy;;   VALVE REPLACEMENT     St Jude mitral valve prosthesis    Review of systems negative except as noted in HPI / PMHx or noted below:  Review of Systems  Constitutional: Negative.   HENT: Negative.    Eyes: Negative.   Respiratory: Negative.    Cardiovascular: Negative.   Gastrointestinal: Negative.   Genitourinary: Negative.   Musculoskeletal: Negative.   Skin: Negative.   Neurological: Negative.   Endo/Heme/Allergies: Negative.   Psychiatric/Behavioral: Negative.       Objective:   Vitals:   08/18/22 0837  BP: (!) 140/68  Pulse: 80  Resp: 16  Temp: 98 F (36.7 C)  SpO2: 96%          Physical Exam Constitutional:      Appearance: She is not diaphoretic.  HENT:     Head:  Normocephalic.     Right Ear: Tympanic membrane, ear canal and external ear normal.     Left Ear: Tympanic membrane, ear canal and external ear normal.     Nose: Nose normal. No mucosal edema or rhinorrhea.     Mouth/Throat:     Pharynx: Uvula midline. No oropharyngeal exudate.  Eyes:     Conjunctiva/sclera: Conjunctivae normal.  Neck:     Thyroid: No thyromegaly.     Trachea: Trachea normal. No tracheal tenderness or tracheal deviation.  Cardiovascular:     Rate and Rhythm: Normal rate and regular rhythm.     Heart sounds: Normal heart sounds, S1 normal and S2 normal. No murmur heard. Pulmonary:     Effort: No respiratory distress.     Breath sounds: Normal breath sounds. No stridor. No wheezing or rales.  Lymphadenopathy:     Head:     Right side of head: No tonsillar adenopathy.  Left side of head: No tonsillar adenopathy.     Cervical: No cervical adenopathy.  Skin:    Findings: No erythema or rash.     Nails: There is no clubbing.  Neurological:     Mental Status: She is alert.     Diagnostics: none  Assessment and Plan:   1. Perennial allergic rhinitis   2. Gastroesophageal reflux disease, unspecified whether esophagitis present   3. Alternating constipation and diarrhea   4. Other eosinophilia    1.  Allergen avoidance measures -cockroach  2.  Can attempt to discontinue montelukast  3.  Continue colestipol 1 g -1 tablet 1 time per day  4. Have primary care doctor handle high blood pressure medications  5.  Obtain blood - CBC w/d for eosinophilia    6. Return to clinic in 6 months or earlier if problem  Maria Ingram appears to be doing well regarding her respiratory tract issue and we will see if she can discontinue her montelukast at this point in time.  She can continue on colestipol for her diarrhea/constipation issue which may or may not have helped her very much.  She can follow-up regarding control of her blood pressure with her primary care doctor now  that her lisinopril has been removed and she uses losartan.  We will follow-up her eosinophilia with a CBC with differential to make sure that this is a stable condition.  Maria Katz, MD Allergy / Immunology [place]

## 2022-08-18 NOTE — Patient Instructions (Addendum)
  1.  Allergen avoidance measures -cockroach  2.  Can attempt to discontinue montelukast  3.  Continue colestipol 1 g -1 tablet 1 time per day  4. Have primary care doctor handle high blood pressure medications  5.  Obtain blood - CBC w/d for eosinophilia    6.  Return to clinic in 6 months or earlier if problem

## 2022-08-19 ENCOUNTER — Encounter: Payer: Self-pay | Admitting: Allergy and Immunology

## 2022-08-19 LAB — CBC WITH DIFFERENTIAL/PLATELET
Basophils Absolute: 0.1 10*3/uL (ref 0.0–0.2)
Basos: 1 %
EOS (ABSOLUTE): 1.8 10*3/uL — ABNORMAL HIGH (ref 0.0–0.4)
Eos: 24 %
Hematocrit: 36.7 % (ref 34.0–46.6)
Hemoglobin: 12.1 g/dL (ref 11.1–15.9)
Immature Grans (Abs): 0 10*3/uL (ref 0.0–0.1)
Immature Granulocytes: 0 %
Lymphocytes Absolute: 1.4 10*3/uL (ref 0.7–3.1)
Lymphs: 19 %
MCH: 31.2 pg (ref 26.6–33.0)
MCHC: 33 g/dL (ref 31.5–35.7)
MCV: 95 fL (ref 79–97)
Monocytes Absolute: 0.6 10*3/uL (ref 0.1–0.9)
Monocytes: 8 %
Neutrophils Absolute: 3.4 10*3/uL (ref 1.4–7.0)
Neutrophils: 48 %
Platelets: 311 10*3/uL (ref 150–450)
RBC: 3.88 x10E6/uL (ref 3.77–5.28)
RDW: 12.6 % (ref 11.7–15.4)
WBC: 7.4 10*3/uL (ref 3.4–10.8)

## 2022-08-20 ENCOUNTER — Other Ambulatory Visit: Payer: Self-pay | Admitting: Allergy and Immunology

## 2022-08-27 ENCOUNTER — Other Ambulatory Visit: Payer: Self-pay | Admitting: Allergy and Immunology

## 2022-08-27 DIAGNOSIS — D7219 Other eosinophilia: Secondary | ICD-10-CM

## 2022-08-29 ENCOUNTER — Telehealth: Payer: Self-pay

## 2022-08-29 NOTE — Telephone Encounter (Signed)
Taisha/[facility] [place]/[phone] called in - DOB verified - stated she needed to verify patient's lab order because she couldn't locate the lab order.  Prudence Davidson advised how patient's last name is listed in the system versus her driving license - all lab test orders were found - no further questions.

## 2022-08-30 LAB — T + B-LYMPHOCYTE DIFFERENTIAL

## 2022-09-05 ENCOUNTER — Other Ambulatory Visit: Payer: Self-pay

## 2022-09-05 MED ORDER — MEMANTINE HCL 10 MG PO TABS: 10.0000 mg | ORAL_TABLET | Freq: Two times a day (BID) | ORAL | 2 refills | Status: DC

## 2022-09-06 LAB — T + B-LYMPHOCYTE DIFFERENTIAL
% CD 3 Pos. Lymph.: 89.4 % — ABNORMAL HIGH (ref 57.5–86.2)
% CD 4 Pos. Lymph.: 63.4 % — ABNORMAL HIGH (ref 30.8–58.5)
Absolute CD 3: 1341 /uL (ref 622–2402)
Absolute CD 4 Helper: 951 /uL (ref 359–1519)
Basophils Absolute: 0.1 10*3/uL (ref 0.0–0.2)
Basos: 1 %
CD19 % B Cell: 2.3 % — ABNORMAL LOW (ref 3.3–25.4)
CD19 Abs: 35 /uL (ref 12–645)
CD4/CD8 Ratio: 2.43 (ref 0.92–3.72)
CD8 % Suppressor T Cell: 26.1 % (ref 12.0–35.5)
CD8 T Cell Abs: 392 /uL (ref 109–897)
EOS (ABSOLUTE): 1.7 10*3/uL — ABNORMAL HIGH (ref 0.0–0.4)
Eos: 25 %
Hematocrit: 37.5 % (ref 34.0–46.6)
Hemoglobin: 12.3 g/dL (ref 11.1–15.9)
Immature Grans (Abs): 0 10*3/uL (ref 0.0–0.1)
Immature Granulocytes: 0 %
Lymphocytes Absolute: 1.5 10*3/uL (ref 0.7–3.1)
Lymphs: 23 %
MCH: 30.8 pg (ref 26.6–33.0)
MCHC: 32.8 g/dL (ref 31.5–35.7)
MCV: 94 fL (ref 79–97)
Monocytes Absolute: 0.5 10*3/uL (ref 0.1–0.9)
Monocytes: 7 %
Neutrophils Absolute: 3 10*3/uL (ref 1.4–7.0)
Neutrophils: 44 %
Platelets: 292 10*3/uL (ref 150–450)
RBC: 3.99 x10E6/uL (ref 3.77–5.28)
RDW: 12.2 % (ref 11.7–15.4)
WBC: 6.8 10*3/uL (ref 3.4–10.8)

## 2022-09-06 LAB — PROTEIN ELECTROPHORESIS, SERUM, WITH REFLEX
A/G Ratio: 1 (ref 0.7–1.7)
Albumin ELP: 3.7 g/dL (ref 2.9–4.4)
Alpha 1: 0.3 g/dL (ref 0.0–0.4)
Alpha 2: 0.5 g/dL (ref 0.4–1.0)
Beta: 1.2 g/dL (ref 0.7–1.3)
Gamma Globulin: 1.6 g/dL (ref 0.4–1.8)
Globulin, Total: 3.6 g/dL (ref 2.2–3.9)
Total Protein: 7.3 g/dL (ref 6.0–8.5)

## 2022-09-06 LAB — MPN W/HYPEREOSINOPHILIA FISH

## 2022-09-06 LAB — STRONGYLOIDES, AB, IGG: Strongyloides, Ab, IgG: POSITIVE — AB

## 2022-09-06 LAB — VITAMIN B12: Vitamin B-12: 669 pg/mL (ref 232–1245)

## 2022-09-06 LAB — TRYPTASE: Tryptase: 8.6 ug/L (ref 2.2–13.2)

## 2022-09-08 ENCOUNTER — Telehealth: Payer: Self-pay | Admitting: Allergy and Immunology

## 2022-09-08 NOTE — Telephone Encounter (Signed)
Patients daughter came by the office requesting a refill on Losartan and Colestipol sent to [facility]. Daughter mentioned that everything is going great with patient and her reflux is a lot better. She also mentioned that Dr. Neldon Mc stopped her cholesterol medication but she had her 3 month check with Dr. Nyra Capes and it came back very high. Patient wants to know if she can start her cholesterol medication again or is there an issue with her taking this?

## 2022-09-08 NOTE — Telephone Encounter (Signed)
Daughter informed and verbalized understanding

## 2022-09-10 ENCOUNTER — Other Ambulatory Visit: Payer: Self-pay | Admitting: *Deleted

## 2022-09-10 MED ORDER — COLESTIPOL HCL 1 G PO TABS: ORAL_TABLET | ORAL | 5 refills | Status: DC

## 2022-09-10 MED ORDER — LOSARTAN POTASSIUM 50 MG PO TABS: 50.0000 mg | ORAL_TABLET | Freq: Every day | ORAL | 5 refills | Status: DC

## 2022-09-22 ENCOUNTER — Other Ambulatory Visit: Payer: Self-pay

## 2022-09-22 MED ORDER — FAMOTIDINE 20 MG PO TABS: 20.0000 mg | ORAL_TABLET | Freq: Every day | ORAL | 2 refills | Status: DC

## 2022-09-22 MED ORDER — MONTELUKAST SODIUM 10 MG PO TABS: 10.0000 mg | ORAL_TABLET | Freq: Every day | ORAL | 5 refills | Status: DC

## 2022-09-29 ENCOUNTER — Other Ambulatory Visit: Payer: Self-pay | Admitting: Allergy and Immunology

## 2022-09-29 MED ORDER — MONTELUKAST SODIUM 10 MG PO TABS: 10.0000 mg | ORAL_TABLET | Freq: Every day | ORAL | 5 refills | Status: DC

## 2022-09-29 MED ORDER — IVERMECTIN 3 MG PO TABS: ORAL_TABLET | ORAL | 0 refills | Status: DC

## 2022-10-02 ENCOUNTER — Other Ambulatory Visit: Payer: Self-pay

## 2022-10-02 MED ORDER — IVERMECTIN 3 MG PO TABS: ORAL_TABLET | ORAL | 0 refills | Status: DC

## 2022-10-06 ENCOUNTER — Other Ambulatory Visit (HOSPITAL_COMMUNITY): Payer: Self-pay

## 2022-10-06 ENCOUNTER — Telehealth: Payer: Self-pay

## 2022-10-06 NOTE — Telephone Encounter (Signed)
Patient Advocate Encounter   Received notification from [facility] that prior authorization is required for Ivermectin '3MG'$  tablets  Submitted: 10-06-2022 [facility]   Status is pendng

## 2022-10-07 NOTE — Telephone Encounter (Signed)
Patient Advocate Encounter  Received a fax from [facility] regarding Prior Authorization for Ivermectin '3MG'$  tablets.   Authorization has been DENIED due to

## 2022-10-13 ENCOUNTER — Other Ambulatory Visit (HOSPITAL_COMMUNITY): Payer: Self-pay

## 2022-10-13 NOTE — Telephone Encounter (Signed)
Is there an update on this PA? Is there anything we need to do to get it approved?

## 2022-10-13 NOTE — Telephone Encounter (Signed)
Patient Advocate Encounter  Prior Authorization for Ivermectin '3MG'$  tablets has been APPROVED through [employer].    KeyJari Favre  Effective: 10-13-2022 to 11-13-2022

## 2022-10-13 NOTE — Telephone Encounter (Signed)
Patient Advocate Encounter   Received notification from [facility] that prior authorization is required for Ivermectin '3MG'$  tablets  Submitted: 10-13-2022 Key [id number]   Has been resubmitted with an expedited request.  Status is pending

## 2022-10-14 NOTE — Telephone Encounter (Signed)
Patient's daughter informed. I called [facility] yesterday and they stated they would have it ready this afternoon

## 2022-10-23 ENCOUNTER — Ambulatory Visit: Payer: Medicaid Other | Attending: Cardiology | Admitting: Cardiology

## 2022-10-23 ENCOUNTER — Encounter: Payer: Self-pay | Admitting: Cardiology

## 2022-10-23 VITALS — BP 160/72 | HR 81 | Ht <= 58 in | Wt 143.0 lb

## 2022-10-23 DIAGNOSIS — E785 Hyperlipidemia, unspecified: Secondary | ICD-10-CM

## 2022-10-23 DIAGNOSIS — Z952 Presence of prosthetic heart valve: Secondary | ICD-10-CM

## 2022-10-23 DIAGNOSIS — I10 Essential (primary) hypertension: Secondary | ICD-10-CM | POA: Diagnosis not present

## 2022-10-23 DIAGNOSIS — E782 Mixed hyperlipidemia: Secondary | ICD-10-CM

## 2022-10-23 DIAGNOSIS — R7303 Prediabetes: Secondary | ICD-10-CM

## 2022-10-23 NOTE — Patient Instructions (Addendum)

## 2022-10-23 NOTE — Addendum Note (Signed)
Addended by: Jacobo Forest D on: 10/23/2022 02:07 PM   Modules accepted: Orders

## 2022-10-23 NOTE — Progress Notes (Signed)
Cardiology Office Note:    Date:  10/23/2022   ID:  Maria Ingram, DOB 16-Nov-1950, MRN [id number]  PCP:  Maryella Shivers, MD  Cardiologist:  Jenne Campus, MD    Referring MD: Maryella Shivers, MD   Chief Complaint  Patient presents with   Follow-up  Doing fine  History of Present Illness:    Maria Ingram is a 72 y.o. female with past medical history significant for mitral valve replacement with mechanical valve done couple years ago in [place], dyslipidemia, essential hypertension, prediabetes.  Diabetes.  She did have abnormal stress test in 2019 after that cardiac catheterization has been done which showed normal coronaries. She is in my office today for follow-up.  Last year we had an issue with colonoscopy which she finally had done she was found to have some polyps, after that she was told that she needed a colonoscopy in 3 years.  Cardiac wise doing well.  Denies have any chest pain tightness squeezing pressure in chest.  She is going to [place] because her family member supposed to have surgery and she wants to be there so she will be away from country for about 3 to 4 months.  Past Medical History:  Diagnosis Date   Abnormal stress test 02/11/2018   Mid and apical portion of the anterior wall ischemia and stress test in July 2019 Cardiac catheterization after that showing normal coronaries   Anemia 11/10/2019   Blood transfusion without reported diagnosis 1981   post delivery of child   Borderline diabetes 02/08/2018   Cataract    bilateral sx   Diabetes mellitus without complication ([place])    diet controlled- no meds   Dyslipidemia 02/08/2018   Essential hypertension 02/08/2018   GERD (gastroesophageal reflux disease)    H/O mitral valve replacement with mechanical valve 01/08/2015   Done in [facility], [facility]  Formatting of this note might be different from the original. Done in [facility], [facility]   History of mitral valve prosthesis 02/08/2018   Done  many years ago in [place]   History of rheumatic heart disease    Hyperlipidemia    on meds   Hypertension    on meds   Osteoarthritis of left knee 09/13/2018   bilateral hands/legs   Pain in left knee 05/20/2018   Pre-diabetes    Borderline   Pre-operative clearance 02/08/2018   Psoriasis    Seasonal allergies     Past Surgical History:  Procedure Laterality Date   BIOPSY  11/18/2021   Procedure: BIOPSY;  Surgeon: Jackquline Denmark, MD;  [facility];  Service: Endoscopy;;  EGD and COLON   COLONOSCOPY WITH PROPOFOL N/A 11/18/2021   Procedure: COLONOSCOPY WITH PROPOFOL;  Surgeon: Jackquline Denmark, MD;  [facility];  Service: Endoscopy;  Laterality: N/A;   CORNEA LACERATION REPAIR Bilateral    ESOPHAGOGASTRODUODENOSCOPY (EGD) WITH PROPOFOL N/A 11/18/2021   Procedure: ESOPHAGOGASTRODUODENOSCOPY (EGD) WITH PROPOFOL;  Surgeon: Jackquline Denmark, MD;  [facility];  Service: Endoscopy;  Laterality: N/A;   KNEE ARTHROSCOPY W/ MENISCAL REPAIR Right 2018   LEFT HEART CATH AND CORONARY ANGIOGRAPHY N/A 02/16/2018   Procedure: LEFT HEART CATH AND CORONARY ANGIOGRAPHY;  Surgeon: Martinique, Peter M, MD;  Location: [facility];  Service: Cardiovascular;  Laterality: N/A;   POLYPECTOMY  11/18/2021   Procedure: POLYPECTOMY;  Surgeon: Jackquline Denmark, MD;  [facility];  Service: Endoscopy;;   SUBMUCOSAL TATTOO INJECTION  11/18/2021   Procedure: SUBMUCOSAL TATTOO INJECTION;  Surgeon: Jackquline Denmark, MD;  [facility];  Service: Endoscopy;;   VALVE REPLACEMENT     St Jude mitral valve prosthesis    Current Medications: Current Meds  Medication Sig   atorvastatin (LIPITOR) 10 MG tablet Take 10 mg by mouth daily.   citalopram (CELEXA) 10 MG tablet Take 10 mg by mouth daily.   colestipol (COLESTID) 1 g tablet Take one tablet once daily   dexlansoprazole (DEXILANT) 60 MG capsule Take 60 mg by mouth daily.   ENSTILAR 0.005-0.064 % FOAM Apply 1 application.  topically 3 (three) times a week.   famotidine (PEPCID) 20 MG tablet Take 1 tablet (20 mg total) by mouth at bedtime.   ivermectin (STROMECTOL) 3 MG TABS tablet Tomar 5 tablets a la vez para completar una dosis.   loratadine (CLARITIN) 10 MG tablet Take 10 mg by mouth daily.   losartan (COZAAR) 50 MG tablet Take 1 tablet (50 mg total) by mouth daily.   memantine (NAMENDA) 10 MG tablet Take 1 tablet (10 mg total) by mouth 2 (two) times daily.   montelukast (SINGULAIR) 10 MG tablet Take 1 tablet (10 mg total) by mouth at bedtime.   SKYRIZI 150 MG/ML SOSY Inject 150 mg as directed every 3 (three) months.   terbinafine (LAMISIL) 250 MG tablet Take 250 mg by mouth daily.   triamcinolone cream (KENALOG) 0.5 % Apply 1 application. topically daily.   warfarin (COUMADIN) 1 MG tablet Take 1 mg by mouth See admin instructions. Take 1 mg by mouth daily except for Sunday   warfarin (COUMADIN) 3 MG tablet Take 3 mg by mouth daily.     Allergies:   Patient has no known allergies.   Social History   Socioeconomic History   Marital status: Married    Spouse name: Not on file   Number of children: Not on file   Years of education: Not on file   Highest education level: Not on file  Occupational History   Not on file  Tobacco Use   Smoking status: Never    Passive exposure: Never   Smokeless tobacco: Never  Vaping Use   Vaping Use: Never used  Substance and Sexual Activity   Alcohol use: Not Currently   Drug use: Never   Sexual activity: Not on file  Other Topics Concern   Not on file  Social History Narrative   Not on file   Social Determinants of Health   Financial Resource Strain: Not on file  Food Insecurity: Not on file  Transportation Needs: Not on file  Physical Activity: Not on file  Stress: Not on file  Social Connections: Not on file     Family History: The patient's family history includes Asthma in her daughter, father, and son; Colon cancer (age of onset: 12) in her  father; Colon polyps (age of onset: 99) in her father; Emphysema in her mother and sister. There is no history of Esophageal cancer, Rectal cancer, Stomach cancer, Immunodeficiency, Eczema, Atopy, or Angioedema. ROS:   Please see the history of present illness.    All 14 point review of systems negative except as described per history of present illness  EKGs/Labs/Other Studies Reviewed:      Recent Labs: 01/17/2022: ALT 16; BUN 24; Creatinine, Ser 0.77; Potassium 4.0; Sodium 136 08/29/2022: Hemoglobin 12.3; Platelets 292  Recent Lipid Panel No results found for: "CHOL", "TRIG", "HDL", "CHOLHDL", "VLDL", "LDLCALC", "LDLDIRECT"  Physical Exam:    VS:  BP (!) 160/72 (BP Location: Right Arm, Patient Position: Sitting, Cuff Size:  Normal)   Pulse 81   Ht 4\' 10"  (1.473 m)   Wt 143 lb (64.9 kg)   SpO2 94%   BMI 29.89 kg/m     Wt Readings from Last 3 Encounters:  10/23/22 143 lb (64.9 kg)  06/30/22 141 lb (64 kg)  04/21/22 139 lb (63 kg)     GEN:  Well nourished, well developed in no acute distress HEENT: Normal NECK: No JVD; No carotid bruits LYMPHATICS: No lymphadenopathy CARDIAC: RRR, crisp mechanical valve sounds, soft systolic murmur grade 1/6 best at left border sternum no rubs, no gallops RESPIRATORY:  Clear to auscultation without rales, wheezing or rhonchi  ABDOMEN: Soft, non-tender, non-distended MUSCULOSKELETAL:  No edema; No deformity  SKIN: Warm and dry LOWER EXTREMITIES: no swelling NEUROLOGIC:  Alert and oriented x 3 PSYCHIATRIC:  Normal affect   ASSESSMENT:    1. H/O mitral valve replacement with mechanical valve   2. Essential hypertension   3. Dyslipidemia   4. Borderline diabetes   5. Mixed hyperlipidemia    PLAN:    In order of problems listed above:  History of mitral valve replacement.  She feels overall good.  Will do echocardiogram to check on the valve.  She does have some shortness of breath with exertion. Essential hypertension elevated today  like always in the office but good at home. Dyslipidemia I did review her K PN which show me again her LDL being 129 HDL 47 however she did have cardiac catheterization which showed no coronary arteries she is taking Lipitor 10 which I will continue will increase dose to 20. Borderline diabetes will be followed by antimedicine team.   Medication Adjustments/Labs and Tests Ordered: Current medicines are reviewed at length with the patient today.  Concerns regarding medicines are outlined above.  No orders of the defined types were placed in this encounter.  Medication changes: No orders of the defined types were placed in this encounter.   Signed, Park Liter, MD, [facility] 10/23/2022 1:57 PM    [place]

## 2022-12-16 ENCOUNTER — Other Ambulatory Visit: Payer: Self-pay | Admitting: Family Medicine

## 2022-12-16 DIAGNOSIS — Z1231 Encounter for screening mammogram for malignant neoplasm of breast: Secondary | ICD-10-CM

## 2022-12-17 ENCOUNTER — Ambulatory Visit
Admission: RE | Admit: 2022-12-17 | Discharge: 2022-12-17 | Disposition: A | Discharge status: Home / Self Care | Payer: Medicaid Other | Source: Ambulatory Visit | Attending: Family Medicine | Admitting: Family Medicine

## 2022-12-17 DIAGNOSIS — Z1231 Encounter for screening mammogram for malignant neoplasm of breast: Secondary | ICD-10-CM

## 2022-12-24 ENCOUNTER — Other Ambulatory Visit: Payer: Self-pay

## 2022-12-24 MED ORDER — MEMANTINE HCL 10 MG PO TABS: 10.0000 mg | ORAL_TABLET | Freq: Two times a day (BID) | ORAL | 2 refills | Status: DC

## 2023-01-22 ENCOUNTER — Other Ambulatory Visit: Payer: Self-pay

## 2023-01-22 MED ORDER — MEMANTINE HCL 10 MG PO TABS: 10.0000 mg | ORAL_TABLET | Freq: Two times a day (BID) | ORAL | 1 refills | Status: DC

## 2023-02-11 ENCOUNTER — Other Ambulatory Visit: Payer: Self-pay | Admitting: Allergy and Immunology

## 2023-02-16 ENCOUNTER — Encounter: Payer: Self-pay | Admitting: Allergy and Immunology

## 2023-02-16 ENCOUNTER — Ambulatory Visit: Payer: Medicaid Other | Admitting: Allergy and Immunology

## 2023-02-16 VITALS — BP 164/62 | HR 72 | Resp 16

## 2023-02-16 DIAGNOSIS — R198 Other specified symptoms and signs involving the digestive system and abdomen: Secondary | ICD-10-CM | POA: Diagnosis not present

## 2023-02-16 DIAGNOSIS — J3089 Other allergic rhinitis: Secondary | ICD-10-CM | POA: Diagnosis not present

## 2023-02-16 DIAGNOSIS — R058 Other specified cough: Secondary | ICD-10-CM | POA: Diagnosis not present

## 2023-02-16 DIAGNOSIS — D7219 Other eosinophilia: Secondary | ICD-10-CM

## 2023-02-16 NOTE — Progress Notes (Unsigned)
[place] - [place] - [place] - [place] - [facility]   Follow-up Note  Referring Provider: Charlott Rakes, MD Primary Provider: Charlott Rakes, MD Date of Office Visit: 02/16/2023  Subjective:   Maria Ingram (DOB: 1950/08/15) is a 72 y.o. female who [facility] in re-evaluation of the following:  HPI: Maria Ingram returns to this clinic in evaluation of allergic rhinitis, cough, constipation and diarrhea, eosinophilia.  I last saw her in this clinic 18 August 2022.  She has really done well with her airway and has not had any issues with cough while using montelukast.  She did attempt to discontinue montelukast and redeveloped cough.  She has not been having any problems with diarrhea or constipation while using colestipol.  She is also having therapy administered by her gastroenterologist for reflux which appears to be working pretty well.  Allergies as of 02/16/2023   No Known Allergies      Medication List    atorvastatin 10 MG tablet Commonly known as: LIPITOR Take 10 mg by mouth daily.   citalopram 10 MG tablet Commonly known as: CELEXA Take 10 mg by mouth daily.   colestipol 1 g tablet Commonly known as: COLESTID TAKE ONE TABLET BY MOUTH EVERY DAY   Enstilar 0.005-0.064 % Foam Generic drug: Calcipotriene-Betameth Diprop Apply 1 application. topically 3 (three) times a week.   famotidine 20 MG tablet Commonly known as: Pepcid Take 1 tablet (20 mg total) by mouth at bedtime.   Kerendia 10 MG Tabs Generic drug: Finerenone Take by mouth daily.   loratadine 10 MG tablet Commonly known as: CLARITIN Take 10 mg by mouth daily.   losartan 50 MG tablet Commonly known as: COZAAR TAKE ONE TABLET BY MOUTH EVERY DAY   memantine 10 MG tablet Commonly known as: Namenda Take 1 tablet (10 mg total) by mouth 2 (two) times daily.   montelukast 10 MG tablet Commonly known as: Singulair Take 1 tablet  (10 mg total) by mouth at bedtime.   omeprazole 40 MG capsule Commonly known as: PRILOSEC Take 40 mg by mouth daily.   Skyrizi 150 MG/ML Sosy prefilled syringe Generic drug: risankizumab-rzaa Inject 150 mg as directed every 3 (three) months.   triamcinolone cream 0.5 % Commonly known as: KENALOG Apply 1 application. topically daily.   warfarin 3 MG tablet Commonly known as: COUMADIN Take 3 mg by mouth daily.   warfarin 1 MG tablet Commonly known as: COUMADIN Take 1 mg by mouth See admin instructions. Take 1 mg by mouth daily except for Sunday    Past Medical History:  Diagnosis Date   Abnormal stress test 02/11/2018   Mid and apical portion of the anterior wall ischemia and stress test in July 2019 Cardiac catheterization after that showing normal coronaries   Anemia 11/10/2019   Blood transfusion without reported diagnosis 1981   post delivery of child   Borderline diabetes 02/08/2018   Cataract    bilateral sx   Diabetes mellitus without complication (HCC)    diet controlled- no meds   Dyslipidemia 02/08/2018   Essential hypertension 02/08/2018   GERD (gastroesophageal reflux disease)    H/O mitral valve replacement with mechanical valve 01/08/2015   Done in [facility], [facility]  Formatting of this note might be different from the original. Done in [facility], [facility]   History of mitral valve prosthesis 02/08/2018   Done many years ago in [place]   History of rheumatic heart disease    History  of strongyloidiasis    Hyperlipidemia    on meds   Hypertension    on meds   Osteoarthritis of left knee 09/13/2018   bilateral hands/legs   Pain in left knee 05/20/2018   Pre-diabetes    Borderline   Pre-operative clearance 02/08/2018   Psoriasis    Seasonal allergies     Past Surgical History:  Procedure Laterality Date   BIOPSY  11/18/2021   Procedure: BIOPSY;  Surgeon: Lynann Bologna, MD;  Location: [facility];  Service: Endoscopy;;  EGD and COLON   BREAST BIOPSY Right  11/25/2017   atrophic breast tissue with stromal fibrosis.   COLONOSCOPY WITH PROPOFOL N/A 11/18/2021   Procedure: COLONOSCOPY WITH PROPOFOL;  Surgeon: Lynann Bologna, MD;  [facility]: [facility];  Service: Endoscopy;  Laterality: N/A;   CORNEA LACERATION REPAIR Bilateral    ESOPHAGOGASTRODUODENOSCOPY (EGD) WITH PROPOFOL N/A 11/18/2021   Procedure: ESOPHAGOGASTRODUODENOSCOPY (EGD) WITH PROPOFOL;  Surgeon: Lynann Bologna, MD;  [facility];  Service: Endoscopy;  Laterality: N/A;   KNEE ARTHROSCOPY W/ MENISCAL REPAIR Right 2018   LEFT HEART CATH AND CORONARY ANGIOGRAPHY N/A 02/16/2018   Procedure: LEFT HEART CATH AND CORONARY ANGIOGRAPHY;  Surgeon: Swaziland, Peter M, MD;  Location: [facility] INVASIVE CV LAB;  Service: Cardiovascular;  Laterality: N/A;   POLYPECTOMY  11/18/2021   Procedure: POLYPECTOMY;  Surgeon: Lynann Bologna, MD;  [facility];  Service: Endoscopy;;   SUBMUCOSAL TATTOO INJECTION  11/18/2021   Procedure: SUBMUCOSAL TATTOO INJECTION;  Surgeon: Lynann Bologna, MD;  [facility];  Service: Endoscopy;;   VALVE REPLACEMENT     St Jude mitral valve prosthesis    Review of systems negative except as noted in HPI / PMHx or noted below:  Review of Systems  Constitutional: Negative.   HENT: Negative.    Eyes: Negative.   Respiratory: Negative.    Cardiovascular: Negative.   Gastrointestinal: Negative.   Genitourinary: Negative.   Musculoskeletal: Negative.   Skin: Negative.   Neurological: Negative.   Endo/Heme/Allergies: Negative.   Psychiatric/Behavioral: Negative.       Objective:   Vitals:   02/16/23 0856 02/16/23 0926  BP: (!) 140/62 (!) 164/62  Pulse: 72   Resp: 16   SpO2: 95%           Physical Exam Constitutional:      Appearance: She is not diaphoretic.  HENT:     Head: Normocephalic.     Right Ear: Tympanic membrane, ear canal and external ear normal.     Left Ear: Tympanic membrane, ear canal and external ear normal.     Nose:  Nose normal. No mucosal edema or rhinorrhea.     Mouth/Throat:     Pharynx: Uvula midline. No oropharyngeal exudate.  Eyes:     Conjunctiva/sclera: Conjunctivae normal.  Neck:     Thyroid: No thyromegaly.     Trachea: Trachea normal. No tracheal tenderness or tracheal deviation.  Cardiovascular:     Rate and Rhythm: Normal rate and regular rhythm.     Heart sounds: S1 normal and S2 normal. Murmur (systolic) heard.  Pulmonary:     Effort: No respiratory distress.     Breath sounds: Normal breath sounds. No stridor. No wheezing or rales.  Lymphadenopathy:     Head:     Right side of head: No tonsillar adenopathy.     Left side of head: No tonsillar adenopathy.     Cervical: No cervical adenopathy.  Skin:    Findings: No erythema or rash.  Nails: There is no clubbing.  Neurological:     Mental Status: She is alert.     Diagnostics: Results of blood tests obtained with 29 August 2022 identified WBC 6.8, absolute eosinophil 1700, absolute basophil 100, absolute lymphocyte 1500, hemoglobin 12.3, platelet 292.  Assessment and Plan:   1. Perennial allergic rhinitis   2. Other cough   3. Other eosinophilia   4. Alternating constipation and diarrhea     1.  Allergen avoidance measures -cockroach  2.  Continue montelukast 10 mg - 1 tablet 1 time per day  3.  Continue colestipol 1 g -1 tablet 1 time per day  4.  Obtain blood - CBC w/d for eosinophilia    5.  Return to clinic in 6 months or earlier if problem  6.  Obtain fall flu vaccine  Debrina appears to be doing very well while using montelukast for her airway issue and colestipol for her constipation/diarrhea issue.  Her eosinophilia is stable.  Will check her level once again.  I will see her back in this clinic in 6 months or earlier if there is a problem.  Laurette Schimke, MD Allergy / Immunology [facility]

## 2023-02-16 NOTE — Patient Instructions (Addendum)
  1.  Allergen avoidance measures -cockroach  2.  Continue montelukast 10 mg - 1 tablet 1 time per day  3.  Continue colestipol 1 g -1 tablet 1 time per day  4.  Obtain blood - CBC w/d for eosinophilia    5.  Return to clinic in 6 months or earlier if problem  6.  Obtain fall flu vaccine

## 2023-02-17 ENCOUNTER — Encounter: Payer: Self-pay | Admitting: Allergy and Immunology

## 2023-03-23 ENCOUNTER — Other Ambulatory Visit: Payer: Self-pay | Admitting: Allergy and Immunology

## 2023-04-20 ENCOUNTER — Other Ambulatory Visit: Payer: Self-pay | Admitting: Allergy and Immunology

## 2023-04-30 ENCOUNTER — Other Ambulatory Visit: Payer: Self-pay | Admitting: Allergy and Immunology

## 2023-05-22 ENCOUNTER — Ambulatory Visit: Payer: Medicaid Other | Attending: Cardiology

## 2023-05-22 DIAGNOSIS — Z952 Presence of prosthetic heart valve: Secondary | ICD-10-CM | POA: Diagnosis not present

## 2023-05-22 LAB — ECHOCARDIOGRAM COMPLETE
Area-P 1/2: 2.87 cm2
MV VTI: 1.35 cm2
S' Lateral: 4.1 cm

## 2023-05-25 ENCOUNTER — Other Ambulatory Visit: Payer: Self-pay | Admitting: *Deleted

## 2023-05-25 ENCOUNTER — Telehealth: Payer: Self-pay

## 2023-05-25 MED ORDER — COLESTIPOL HCL 1 G PO TABS: ORAL_TABLET | ORAL | 3 refills | Status: DC

## 2023-05-25 NOTE — Telephone Encounter (Signed)
Left message on My Chart with normal results per Dr. Krasowski's note. Routed to PCP. 

## 2023-06-15 ENCOUNTER — Other Ambulatory Visit: Payer: Self-pay

## 2023-06-15 ENCOUNTER — Encounter: Payer: Self-pay | Admitting: Cardiology

## 2023-06-15 ENCOUNTER — Ambulatory Visit: Payer: Medicaid Other | Attending: Cardiology | Admitting: Cardiology

## 2023-06-15 VITALS — BP 144/70 | HR 84 | Ht 62.0 in | Wt 145.8 lb

## 2023-06-15 DIAGNOSIS — Z952 Presence of prosthetic heart valve: Secondary | ICD-10-CM

## 2023-06-15 DIAGNOSIS — R7303 Prediabetes: Secondary | ICD-10-CM

## 2023-06-15 DIAGNOSIS — I10 Essential (primary) hypertension: Secondary | ICD-10-CM | POA: Diagnosis not present

## 2023-06-15 MED ORDER — FAMOTIDINE 20 MG PO TABS: 20.0000 mg | ORAL_TABLET | Freq: Every day | ORAL | 1 refills | Status: DC

## 2023-06-15 NOTE — Patient Instructions (Signed)

## 2023-06-15 NOTE — Progress Notes (Signed)
Cardiology Office Note:    Date:  06/15/2023   ID:  [name], DOB April 13, 1951, MRN [id number]  PCP:  Charlott Rakes, MD  Cardiologist:  Gypsy Balsam, MD    Referring MD: Charlott Rakes, MD   Chief Complaint  Patient presents with   Results    Echo    History of Present Illness:    Maria Ingram is a 72 y.o. female with past medical history significant for mitral valve replacement with mechanical Saint Jude valve done in [place] in 2000, dyslipidemia, essential hypertension, prediabetes.  Comes today to months for follow-up overall doing very well.  Denies of any chest pain tightness squeezing pressure burning chest no shortness of breath no palpitations overall well she described to have some swelling of lower extremities but only minimal at evening time around the ankle area  Past Medical History:  Diagnosis Date   Abnormal stress test 02/11/2018   Mid and apical portion of the anterior wall ischemia and stress test in July 2019 Cardiac catheterization after that showing normal coronaries   Anemia 11/10/2019   Blood transfusion without reported diagnosis 1981   post delivery of child   Borderline diabetes 02/08/2018   Cataract    bilateral sx   Diabetes mellitus without complication (HCC)    diet controlled- no meds   Dyslipidemia 02/08/2018   Essential hypertension 02/08/2018   GERD (gastroesophageal reflux disease)    H/O mitral valve replacement with mechanical valve 01/08/2015   Done in [facility], [facility]  Formatting of this note might be different from the original. Done in [facility], [facility]   History of mitral valve prosthesis 02/08/2018   Done many years ago in [place]   History of rheumatic heart disease    History of strongyloidiasis    Hyperlipidemia    on meds   Hypertension    on meds   Osteoarthritis of left knee 09/13/2018   bilateral hands/legs   Pain in left knee 05/20/2018   Pre-diabetes    Borderline   Pre-operative clearance  02/08/2018   Psoriasis    Seasonal allergies     Past Surgical History:  Procedure Laterality Date   BIOPSY  11/18/2021   Procedure: BIOPSY;  Surgeon: Lynann Bologna, MD;  [facility];  Service: Endoscopy;;  EGD and COLON   BREAST BIOPSY Right 11/25/2017   atrophic breast tissue with stromal fibrosis.   COLONOSCOPY WITH PROPOFOL N/A 11/18/2021   Procedure: COLONOSCOPY WITH PROPOFOL;  Surgeon: Lynann Bologna, MD;  [facility];  Service: Endoscopy;  Laterality: N/A;   CORNEA LACERATION REPAIR Bilateral    ESOPHAGOGASTRODUODENOSCOPY (EGD) WITH PROPOFOL N/A 11/18/2021   Procedure: ESOPHAGOGASTRODUODENOSCOPY (EGD) WITH PROPOFOL;  Surgeon: Lynann Bologna, MD;  [facility];  Service: Endoscopy;  Laterality: N/A;   KNEE ARTHROSCOPY W/ MENISCAL REPAIR Right 2018   LEFT HEART CATH AND CORONARY ANGIOGRAPHY N/A 02/16/2018   Procedure: LEFT HEART CATH AND CORONARY ANGIOGRAPHY;  Surgeon: Swazil[place], Peter M, MD;  Location: [facility] INVASIVE CV LAB;  Service: Cardiovascular;  Laterality: N/A;   POLYPECTOMY  11/18/2021   Procedure: POLYPECTOMY;  Surgeon: Lynann Bologna, MD;  [facility];  Service: Endoscopy;;   SUBMUCOSAL TATTOO INJECTION  11/18/2021   Procedure: SUBMUCOSAL TATTOO INJECTION;  Surgeon: Lynann Bologna, MD;  [facility];  Service: Endoscopy;;   VALVE REPLACEMENT     St Jude mitral valve prosthesis    Current Medications: Current Meds  Medication Sig   atorvastatin (LIPITOR) 10 MG tablet Take 10 mg by  mouth daily.   citalopram (CELEXA) 10 MG tablet Take 10 mg by mouth daily.   colestipol (COLESTID) 1 g tablet TAKE ONE TABLET BY MOUTH ONCE DAILY (Patient taking differently: Take 1 g by mouth daily. TAKE ONE TABLET BY MOUTH ONCE DAILY)   ENSTILAR 0.005-0.064 % FOAM Apply 1 application. topically 3 (three) times a week.   Finerenone (KERENDIA) 10 MG TABS Take 1 tablet by mouth daily.   loratadine (CLARITIN) 10 MG tablet Take 10 mg by mouth daily.    losartan (COZAAR) 50 MG tablet TAKE ONE TABLET BY MOUTH EVERY DAY   memantine (NAMENDA) 10 MG tablet Take 1 tablet (10 mg total) by mouth 2 (two) times daily.   montelukast (SINGULAIR) 10 MG tablet Take 1 tablet (10 mg total) by mouth at bedtime.   SKYRIZI 150 MG/ML SOSY Inject 150 mg as directed every 3 (three) months.   triamcinolone cream (KENALOG) 0.5 % Apply 1 application. topically daily.   warfarin (COUMADIN) 1 MG tablet Take 1 mg by mouth See admin instructions. Take 1 mg by mouth daily except for Sunday   warfarin (COUMADIN) 3 MG tablet Take 3 mg by mouth daily.   [DISCONTINUED] famotidine (PEPCID) 20 MG tablet Take 1 tablet (20 mg total) by mouth at bedtime.   [DISCONTINUED] omeprazole (PRILOSEC) 40 MG capsule Take 40 mg by mouth daily.     Allergies:   Patient has no known allergies.   Social History   Socioeconomic History   Marital status: Married    Spouse name: Not on file   Number of children: Not on file   Years of education: Not on file   Highest education level: Not on file  Occupational History   Not on file  Tobacco Use   Smoking status: Never    Passive exposure: Never   Smokeless tobacco: Never  Vaping Use   Vaping status: Never Used  Substance and Sexual Activity   Alcohol use: Not Currently   Drug use: Never   Sexual activity: Not on file  Other Topics Concern   Not on file  Social History Narrative   Not on file   Social Determinants of Health   Financial Resource Strain: Not on file  Food Insecurity: Not on file  Transportation Needs: Not on file  Physical Activity: Not on file  Stress: Not on file  Social Connections: Not on file     Family History: The patient's family history includes Asthma in her daughter, father, and son; Colon cancer (age of onset: 8) in her father; Colon polyps (age of onset: 2) in her father; Emphysema in her mother and sister. There is no history of Esophageal cancer, Rectal cancer, Stomach cancer,  Immunodeficiency, Eczema, Atopy, or Angioedema. ROS:   Please see the history of present illness.    All 14 point review of systems negative except as described per history of present illness  EKGs/Labs/Other Studies Reviewed:         Recent Labs: 04/07/2023: Hemoglobin 12.0; Platelets 295  Recent Lipid Panel No results found for: "CHOL", "TRIG", "HDL", "CHOLHDL", "VLDL", "LDLCALC", "LDLDIRECT"  Physical Exam:    VS:  BP (!) 144/70 (BP Location: Left Arm, Patient Position: Sitting)   Pulse 84   Ht 5\' 2"  (1.575 m)   Wt 145 lb 12.8 oz (66.1 kg)   SpO2 92%   BMI 26.67 kg/m     Wt Readings from Last 3 Encounters:  06/15/23 145 lb 12.8 oz (66.1 kg)  10/23/22 143 lb (  64.9 kg)  06/30/22 141 lb (64 kg)     GEN:  Well nourished, well developed in no acute distress HEENT: Normal NECK: No JVD; No carotid bruits LYMPHATICS: No lymphadenopathy CARDIAC: RRR, crisp mechanical valve sounds, systolic murmur grade 1/6 to 2/6 basilar border sternum, no rubs, no gallops RESPIRATORY:  Clear to auscultation without rales, wheezing or rhonchi  ABDOMEN: Soft, non-tender, non-distended MUSCULOSKELETAL:  No edema; No deformity  SKIN: Warm and dry LOWER EXTREMITIES: no swelling NEUROLOGIC:  Alert and oriented x 3 PSYCHIATRIC:  Normal affect   ASSESSMENT:    1. H/O mitral valve replacement with mechanical valve   2. Essential hypertension   3. Borderline diabetes    PLAN:    In order of problems listed above:  History of valve replacement did review echocardiogram valve functioning properly we will continue management. Essential hypertension: Blood pressure well-controlled continue present management. Dyslipidemia I did review K PN LDL 51 HDL 50 good control continue present management.  She is on Coumadin for anticoagulation which we will continue   Medication Adjustments/Labs and Tests Ordered: Current medicines are reviewed at length with the patient today.  Concerns regarding  medicines are outlined above.  No orders of the defined types were placed in this encounter.  Medication changes: No orders of the defined types were placed in this encounter.   Signed, Georgeanna Lea, MD, [facility] 06/15/2023 3:45 PM    [facility]

## 2023-07-01 ENCOUNTER — Encounter: Payer: Self-pay | Admitting: Neurology

## 2023-07-01 ENCOUNTER — Ambulatory Visit: Payer: Medicaid Other | Admitting: Neurology

## 2023-08-12 ENCOUNTER — Other Ambulatory Visit: Payer: Self-pay

## 2023-08-12 MED ORDER — MEMANTINE HCL 10 MG PO TABS: 10.0000 mg | ORAL_TABLET | Freq: Two times a day (BID) | ORAL | 0 refills | Status: DC

## 2023-08-19 ENCOUNTER — Encounter: Payer: Self-pay | Admitting: Allergy and Immunology

## 2023-08-19 ENCOUNTER — Ambulatory Visit: Payer: Medicaid Other | Admitting: Allergy and Immunology

## 2023-08-19 VITALS — BP 136/82 | HR 68 | Resp 16 | Ht 58.5 in | Wt 145.8 lb

## 2023-08-19 DIAGNOSIS — R058 Other specified cough: Secondary | ICD-10-CM

## 2023-08-19 DIAGNOSIS — J3089 Other allergic rhinitis: Secondary | ICD-10-CM | POA: Diagnosis not present

## 2023-08-19 DIAGNOSIS — R198 Other specified symptoms and signs involving the digestive system and abdomen: Secondary | ICD-10-CM | POA: Diagnosis not present

## 2023-08-19 DIAGNOSIS — D7219 Other eosinophilia: Secondary | ICD-10-CM

## 2023-08-19 NOTE — Progress Notes (Signed)
- [place] - [place] - [place] - [place]   Follow-up Note  Referring Provider: Charlott Rakes, MD Primary Provider: Charlott Rakes, MD Date of Office Visit: 08/19/2023  Subjective:   Maria Ingram (DOB: 24-Jul-1951) is a 73 y.o. female who [facility] 08[facility]23 in re-evaluation of the following:  HPI: Verda returns to this clinic in evaluation of allergic rhinitis, cough, constipation diarrhea, eosinophilia.  I last saw her in this clinic 16 February 2023.  She has really done well with her airway and has not had any significant respiratory tract symptoms including cough or problems with her nose and she has not required a systemic steroid or an antibiotic for any type of airway issue since I have last seen her in this clinic.  She continues on montelukast.  Her diarrhea and other GI issues appear to be under very good control with colestipol and she is very happy with the response that she has received while utilizing this agent.  She has received this years flu vaccine and COVID-vaccine.  She has not received a RSV vaccine.  Allergies as of 08/19/2023   No Known Allergies      Medication List    atorvastatin 10 MG tablet Commonly known as: LIPITOR Take 10 mg by mouth daily.   citalopram 10 MG tablet Commonly known as: CELEXA Take 10 mg by mouth daily.   colestipol 1 g tablet Commonly known as: COLESTID TAKE ONE TABLET BY MOUTH ONCE DAILY   Enstilar 0.005-0.064 % Foam Generic drug: Calcipotriene-Betameth Diprop Apply 1 application. topically 3 (three) times a week.   famotidine 20 MG tablet Commonly known as: Pepcid Take 1 tablet (20 mg total) by mouth at bedtime.   Kerendia 10 MG Tabs Generic drug: Finerenone Take 1 tablet by mouth daily.   loratadine 10 MG tablet Commonly known as: CLARITIN Take 10 mg by mouth daily.   losartan 50 MG tablet Commonly known as: COZAAR TAKE ONE TABLET BY  MOUTH EVERY DAY   memantine 10 MG tablet Commonly known as: Namenda Take 1 tablet (10 mg total) by mouth 2 (two) times daily. APPOINTMENT NEEDED FOR FURTHER REFILLS   montelukast 10 MG tablet Commonly known as: Singulair Take 1 tablet (10 mg total) by mouth at bedtime.   Skyrizi 150 MG/ML Sosy prefilled syringe Generic drug: risankizumab-rzaa Inject 150 mg as directed every 3 (three) months.   triamcinolone cream 0.5 % Commonly known as: KENALOG Apply 1 application. topically daily.   warfarin 3 MG tablet Commonly known as: COUMADIN Take 3 mg by mouth daily.   warfarin 1 MG tablet Commonly known as: COUMADIN Take 1 mg by mouth See admin instructions. Take 1 mg by mouth daily except for Sunday    Past Medical History:  Diagnosis Date   Abnormal stress test 02/11/2018   Mid and apical portion of the anterior wall ischemia and stress test in July 2019 Cardiac catheterization after that showing normal coronaries   Anemia 11/10/2019   Blood transfusion without reported diagnosis 1981   post delivery of child   Borderline diabetes 02/08/2018   Cataract    bilateral sx   Diabetes mellitus without complication (HCC)    diet controlled- no meds   Dyslipidemia 02/08/2018   Essential hypertension 02/08/2018   GERD (gastroesophageal reflux disease)    H/O mitral valve replacement with mechanical valve 01/08/2015   Done in [facility], [facility]  Formatting of this note might be different from the original.  Done in [facility], [facility]   History of mitral valve prosthesis 02/08/2018   Done many years ago in [place]   History of rheumatic heart disease    History of strongyloidiasis    Hyperlipidemia    on meds   Hypertension    on meds   Osteoarthritis of left knee 09/13/2018   bilateral hands/legs   Pain in left knee 05/20/2018   Pre-diabetes    Borderline   Pre-operative clearance 02/08/2018   Psoriasis    Seasonal allergies     Past Surgical History:  Procedure Laterality Date    BIOPSY  11/18/2021   Procedure: BIOPSY;  Surgeon: Lynann Bologna, MD;  [facility];  Service: Endoscopy;;  EGD and COLON   BREAST BIOPSY Right 11/25/2017   atrophic breast tissue with stromal fibrosis.   COLONOSCOPY WITH PROPOFOL N/A 11/18/2021   Procedure: COLONOSCOPY WITH PROPOFOL;  Surgeon: Lynann Bologna, MD;  [facility];  Service: Endoscopy;  Laterality: N/A;   CORNEA LACERATION REPAIR Bilateral    ESOPHAGOGASTRODUODENOSCOPY (EGD) WITH PROPOFOL N/A 11/18/2021   Procedure: ESOPHAGOGASTRODUODENOSCOPY (EGD) WITH PROPOFOL;  Surgeon: Lynann Bologna, MD;  [facility];  Service: Endoscopy;  Laterality: N/A;   KNEE ARTHROSCOPY W/ MENISCAL REPAIR Right 2018   LEFT HEART CATH AND CORONARY ANGIOGRAPHY N/A 02/16/2018   Procedure: LEFT HEART CATH AND CORONARY ANGIOGRAPHY;  Surgeon: Swazil[place], Peter M, MD;  [facility] CV LAB;  Service: Cardiovascular;  Laterality: N/A;   POLYPECTOMY  11/18/2021   Procedure: POLYPECTOMY;  Surgeon: Lynann Bologna, MD;  [facility];  Service: Endoscopy;;   SUBMUCOSAL TATTOO INJECTION  11/18/2021   Procedure: SUBMUCOSAL TATTOO INJECTION;  Surgeon: Lynann Bologna, MD;  [facility];  Service: Endoscopy;;   VALVE REPLACEMENT     St Jude mitral valve prosthesis    Review of systems negative except as noted in HPI / PMHx or noted below:  Review of Systems  Constitutional: Negative.   HENT: Negative.    Eyes: Negative.   Respiratory: Negative.    Cardiovascular: Negative.   Gastrointestinal: Negative.   Genitourinary: Negative.   Musculoskeletal: Negative.   Skin: Negative.   Neurological: Negative.   Endo/Heme/Allergies: Negative.   Psychiatric/Behavioral: Negative.       Objective:   Vitals:   08/19/23 0809  BP: 136/82  Pulse: 68  Resp: 16  SpO2: 98%   Height: 4' 10.5" (148.6 cm)  Weight: 145 lb 12.8 oz (66.1 kg)   Physical Exam Constitutional:      Appearance: She is not diaphoretic.   HENT:     Head: Normocephalic.     Right Ear: Tympanic membrane, ear canal and external ear normal.     Left Ear: Tympanic membrane, ear canal and external ear normal.     Nose: Nose normal. No mucosal edema or rhinorrhea.     Mouth/Throat:     Pharynx: Uvula midline. No oropharyngeal exudate.  Eyes:     Conjunctiva/sclera: Conjunctivae normal.  Neck:     Thyroid: No thyromegaly.     Trachea: Trachea normal. No tracheal tenderness or tracheal deviation.  Cardiovascular:     Rate and Rhythm: Normal rate and regular rhythm.     Heart sounds: Normal heart sounds, S1 normal and S2 normal. No murmur heard. Pulmonary:     Effort: No respiratory distress.     Breath sounds: Normal breath sounds. No stridor. No wheezing or rales.  Lymphadenopathy:     Head:     Right side of  head: No tonsillar adenopathy.     Left side of head: No tonsillar adenopathy.     Cervical: No cervical adenopathy.  Skin:    Findings: No erythema or rash.     Nails: There is no clubbing.  Neurological:     Mental Status: She is alert.     Diagnostics:   Results of blood tests obtained 07 April 2023 identifies WBC 6.1, absolute eosinophil 300, absolute lymphocyte 1500, hemoglobin 12.0, platelet 295.  Assessment and Plan:   1. Perennial allergic rhinitis   2. Other cough   3. Alternating constipation and diarrhea   4. Other eosinophilia    1.  Allergen avoidance measures -cockroach  2.  Continue montelukast 10 mg - 1 tablet 1 time per day  3.  Continue colestipol 1 g -1 tablet 1 time per day  4.  Return to clinic in 12 months or earlier if problem  5.  Influenza = Tamiflu, Covid = Paxlovid  Tumeka is doing wonderful and I do not see a need for having her return to this clinic on a regular basis.  I will be very happy to see her back in this clinic in 1 year to refill her montelukast and colestipol or she can have these refilled by her primary care doctor.  Her eosinophilia appears to have  resolved.  I did recommend that she get the RSV vaccine but there seems to be some hesitation about receiving that vaccine at this point.  Laurette Schimke, MD Allergy / Immunology [facility]

## 2023-08-19 NOTE — Patient Instructions (Addendum)
  1.  Allergen avoidance measures -cockroach  2.  Continue montelukast  10 mg - 1 tablet 1 time per day  3.  Continue colestipol  1 g -1 tablet 1 time per day  4.  Return to clinic in 12 months or earlier if problem  5.  Influenza = Tamiflu, Covid = Paxlovid

## 2023-08-20 ENCOUNTER — Encounter: Payer: Self-pay | Admitting: Allergy and Immunology

## 2023-08-31 ENCOUNTER — Ambulatory Visit (INDEPENDENT_AMBULATORY_CARE_PROVIDER_SITE_OTHER): Payer: Medicaid Other | Admitting: Allergy and Immunology

## 2023-08-31 ENCOUNTER — Encounter: Payer: Self-pay | Admitting: Allergy and Immunology

## 2023-08-31 ENCOUNTER — Telehealth: Payer: Self-pay | Admitting: Allergy and Immunology

## 2023-08-31 VITALS — BP 140/86 | HR 76 | Resp 16

## 2023-08-31 DIAGNOSIS — D7219 Other eosinophilia: Secondary | ICD-10-CM | POA: Diagnosis not present

## 2023-08-31 DIAGNOSIS — J157 Pneumonia due to Mycoplasma pneumoniae: Secondary | ICD-10-CM | POA: Diagnosis not present

## 2023-08-31 DIAGNOSIS — R198 Other specified symptoms and signs involving the digestive system and abdomen: Secondary | ICD-10-CM

## 2023-08-31 DIAGNOSIS — J3089 Other allergic rhinitis: Secondary | ICD-10-CM | POA: Diagnosis not present

## 2023-08-31 MED ORDER — IPRATROPIUM-ALBUTEROL 0.5-2.5 (3) MG/3ML IN SOLN: 3.0000 mL | Freq: Four times a day (QID) | RESPIRATORY_TRACT | 1 refills | Status: DC | PRN

## 2023-08-31 MED ORDER — AZITHROMYCIN 500 MG PO TABS: ORAL_TABLET | ORAL | 0 refills | Status: DC

## 2023-08-31 NOTE — Telephone Encounter (Signed)
Patient is coming in at 4:20pm.

## 2023-08-31 NOTE — Progress Notes (Signed)
[place] [facility] [place] [facility] [place] [facility] [place] [facility] [place]   Follow-up Note  Referring Provider: Charlott Rakes, MD Primary Provider: Charlott Rakes, MD Date of Office Visit: 08/31/2023  Subjective:   Maria Ingram (DOB: November 15, 1950) is a 73 y.o. female who [facility] in re-evaluation of the following:  HPI: Maria Ingram presents to this clinic in evaluation of acute illness.  I have seen her in this clinic for allergic rhinitis and cough and diarrhea and eosinophilia.  I last saw her in this clinic 19 August 2023.  For 1 week she has been sick initially with sore throat and chills and aches and cough and although her sore throat has improved she still feels awful and she still coughing like crazy and gets short of breath when she exerts herself.  She visited with her primary care doctor today who performed several swabs for viral illnesses all which were negative and gave her an injection of a steroid and arrange for her to obtain a chest x-ray.  Allergies as of 08/31/2023   No Known Allergies      Medication List    atorvastatin 10 MG tablet Commonly known as: LIPITOR Take 10 mg by mouth daily.   citalopram 10 MG tablet Commonly known as: CELEXA Take 10 mg by mouth daily.   colestipol 1 g tablet Commonly known as: COLESTID TAKE ONE TABLET BY MOUTH ONCE DAILY   Enstilar 0.005-0.064 % Foam Generic drug: Calcipotriene-Betameth Diprop Apply 1 application. topically 3 (three) times a week.   famotidine 20 MG tablet Commonly known as: Pepcid Take 1 tablet (20 mg total) by mouth at bedtime.   Kerendia 10 MG Tabs Generic drug: Finerenone Take 1 tablet by mouth daily.   loratadine 10 MG tablet Commonly known as: CLARITIN Take 10 mg by mouth daily.   losartan 50 MG tablet Commonly known as: COZAAR TAKE ONE TABLET BY MOUTH EVERY DAY   memantine 10 MG tablet Commonly known as: Namenda Take 1 tablet (10 mg  total) by mouth 2 (two) times daily. APPOINTMENT NEEDED FOR FURTHER REFILLS   montelukast 10 MG tablet Commonly known as: Singulair Take 1 tablet (10 mg total) by mouth at bedtime.   Skyrizi 150 MG/ML Sosy prefilled syringe Generic drug: risankizumab-rzaa Inject 150 mg as directed every 3 (three) months.   triamcinolone cream 0.5 % Commonly known as: KENALOG Apply 1 application. topically daily.   warfarin 3 MG tablet Commonly known as: COUMADIN Take 3 mg by mouth daily.   warfarin 1 MG tablet Commonly known as: COUMADIN Take 1 mg by mouth See admin instructions. Take 1 mg by mouth daily except for Sunday        Past Medical History:  Diagnosis Date   Abnormal stress test 02/11/2018   Mid and apical portion of the anterior wall ischemia and stress test in July 2019 Cardiac catheterization after that showing normal coronaries   Anemia 11/10/2019   Blood transfusion without reported diagnosis 1981   post delivery of child   Borderline diabetes 02/08/2018   Cataract    bilateral sx   Diabetes mellitus without complication (HCC)    diet controlled- no meds   Dyslipidemia 02/08/2018   Essential hypertension 02/08/2018   GERD (gastroesophageal reflux disease)    H/O mitral valve replacement with mechanical valve 01/08/2015   [place] [facility]  Formatting of this note might be different from the original. Done [place] [facility]   History  of mitral valve prosthesis 02/08/2018   Done many years ago in [place]   History of rheumatic heart disease    History of strongyloidiasis    Hyperlipidemia    on meds   Hypertension    on meds   Osteoarthritis of left knee 09/13/2018   bilateral hands/legs   Pain in left knee 05/20/2018   Pre-diabetes    Borderline   Pre-operative clearance 02/08/2018   Psoriasis    Seasonal allergies     Past Surgical History:  Procedure Laterality Date   BIOPSY  11/18/2021   Procedure: BIOPSY;  Surgeon: Lynann Bologna, MD;  Location: [facility];  Service: Endoscopy;;  EGD and COLON   BREAST BIOPSY Right 11/25/2017   atrophic breast tissue with stromal fibrosis.   COLONOSCOPY WITH PROPOFOL N/A 11/18/2021   Procedure: COLONOSCOPY WITH PROPOFOL;  Surgeon: Lynann Bologna, MD;  Location: [facility];  Service: Endoscopy;  Laterality: N/A;   CORNEA LACERATION REPAIR Bilateral    ESOPHAGOGASTRODUODENOSCOPY (EGD) WITH PROPOFOL N/A 11/18/2021   Procedure: ESOPHAGOGASTRODUODENOSCOPY (EGD) WITH PROPOFOL;  Surgeon: Lynann Bologna, MD;  Location: [facility];  Service: Endoscopy;  Laterality: N/A;   KNEE ARTHROSCOPY W/ MENISCAL REPAIR Right 2018   LEFT HEART CATH AND CORONARY ANGIOGRAPHY N/A 02/16/2018   Procedure: LEFT HEART CATH AND CORONARY ANGIOGRAPHY;  Surgeon: Swaziland, Peter M, MD;  Location: [facility] INVASIVE CV LAB;  Service: Cardiovascular;  Laterality: N/A;   POLYPECTOMY  11/18/2021   Procedure: POLYPECTOMY;  Surgeon: Lynann Bologna, MD;  Location: [facility];  Service: Endoscopy;;   SUBMUCOSAL TATTOO INJECTION  11/18/2021   Procedure: SUBMUCOSAL TATTOO INJECTION;  Surgeon: Lynann Bologna, MD;  Location: [facility];  Service: Endoscopy;;   VALVE REPLACEMENT     St Jude mitral valve prosthesis    Review of systems negative except as noted in HPI / PMHx or noted below:  Review of Systems  Constitutional: Negative.   HENT: Negative.    Eyes: Negative.   Respiratory: Negative.    Cardiovascular: Negative.   Gastrointestinal: Negative.   Genitourinary: Negative.   Musculoskeletal: Negative.   Skin: Negative.   Neurological: Negative.   Endo/Heme/Allergies: Negative.   Psychiatric/Behavioral: Negative.       Objective:   Vitals:   08/31/23 1632  BP: (!) 140/86  Pulse: 76  Resp: 16  SpO2: 93%          Physical Exam Constitutional:      Appearance: She is not diaphoretic.  HENT:     Head: Normocephalic.     Right Ear: External ear normal.     Left Ear: External ear normal.     Nose: Nose normal.  Eyes:      Conjunctiva/sclera: Conjunctivae normal.  Neck:     Thyroid: No thyromegaly.     Trachea: Trachea normal. No tracheal tenderness or tracheal deviation.  Cardiovascular:     Rate and Rhythm: Normal rate and regular rhythm.     Heart sounds: Normal heart sounds, S1 normal and S2 normal. No murmur heard. Pulmonary:     Effort: No respiratory distress.     Breath sounds: No stridor. Wheezing (Widespread inspiratory crackles and wheezing and expiratory wheezing) present. No rales.  Lymphadenopathy:     Head:     Right side of head: No tonsillar adenopathy.     Left side of head: No tonsillar adenopathy.     Cervical: No cervical adenopathy.  Skin:    Findings: No erythema or rash.     Nails: There is  no clubbing.  Neurological:     Mental Status: She is alert.     Diagnostics: none  Assessment and Plan:   1. Pneumonia of both lungs due to Mycoplasma pneumoniae, unspecified part of lung   2. Perennial allergic rhinitis   3. Alternating constipation and diarrhea   4. Other eosinophilia    1.  Allergen avoidance measures -cockroach  2.  Continue montelukast 10 mg - 1 tablet 1 time per day  3.  Continue colestipol 1 g -1 tablet 1 time per day  4. For this recent event:   A. Azithromycin 500 mg - 1 tablet 1 time per day for 3 days  B. Prednisone 10 mg - 2 tablets 1 time per day for 3 days  C. Duoneb nebulized every 6 hours if needed  4.  Return to clinic in 12 months or earlier if problem  5.  Influenza = Tamiflu, Covid = Paxlovid  Maria Ingram is ill and she appears to have a organism within her lower airway that is giving rise to significant inflammation of her airway and given the fact that all of her viral swabs are negative and the fact that her oxygenation is somewhat diminished we will cover her for mycoplasma with azithromycin and I have given her a little bit more anti-inflammatory medicine in the form of a systemic steroid above and beyond the injection of steroid she  received today.  Regarding her other issues she will remain on montelukast and colestipol.  She does take Skyrizi but her next injection is next month so we do not have to worry about her receiving that injection while acutely ill with this event.  She cannot really take ibuprofen because she has warfarin so she will just rely on the use of Tylenol for her constitutional symptoms at this point.  Maria Schimke, MD Allergy / Immunology [facility]

## 2023-08-31 NOTE — Patient Instructions (Addendum)
  1.  Allergen avoidance measures -cockroach  2.  Continue montelukast 10 mg - 1 tablet 1 time per day  3.  Continue colestipol 1 g -1 tablet 1 time per day  4. For this recent event:   A. Azithromycin 500 mg - 1 tablet 1 time per day for 3 days  B. Prednisone 10 mg - 2 tablets 1 time per day for 3 days  C. Duoneb nebulized every 6 hours if needed  4.  Return to clinic in 12 months or earlier if problem  5.  Influenza = Tamiflu, Covid = Paxlovid

## 2023-08-31 NOTE — Telephone Encounter (Signed)
Daughter states patient has been fighting what seems to be a bad cold since Friday. She has had SOB, bad cough, chills, body aches, and a fever but not today. She's been taking Tylenol but hasn't helped much. She took a COVID  test and it was negative. Daughter is worried because it is affecting her breathing. She wants to know if an antibiotic will help or if there is anything patient can take.

## 2023-09-02 MED ORDER — BUDESONIDE 0.5 MG/2ML IN SUSP: 0.5000 mg | Freq: Two times a day (BID) | RESPIRATORY_TRACT | 5 refills | Status: DC

## 2023-09-02 NOTE — Telephone Encounter (Signed)
Daughter informed and verbalized understanding

## 2023-09-02 NOTE — Telephone Encounter (Signed)
Daughter states patient is still feeling really bad. She could not sleep at all last night due to shortness of breath and choking. Her chest x-ray came back and they told her there was nothing and that she had no pneumonia. She is taking all her medications and is using a humidifier at night.

## 2023-09-02 NOTE — Telephone Encounter (Signed)
Left message for patient's daughter to call back.

## 2023-09-02 NOTE — Addendum Note (Signed)
Addended by: Deborra Medina on: 09/02/2023 04:47 PM   Modules accepted: Orders

## 2023-09-02 NOTE — Telephone Encounter (Signed)
Please advice . Thank you

## 2023-09-02 NOTE — Telephone Encounter (Signed)
Prescription sent to [facility]

## 2023-10-12 ENCOUNTER — Other Ambulatory Visit: Payer: Self-pay | Admitting: *Deleted

## 2023-10-12 MED ORDER — MONTELUKAST SODIUM 10 MG PO TABS: 10.0000 mg | ORAL_TABLET | Freq: Every day | ORAL | 9 refills | Status: DC

## 2023-12-18 ENCOUNTER — Other Ambulatory Visit: Payer: Self-pay | Admitting: *Deleted

## 2023-12-18 MED ORDER — COLESTIPOL HCL 1 G PO TABS: ORAL_TABLET | ORAL | 7 refills | Status: DC

## 2023-12-23 ENCOUNTER — Other Ambulatory Visit: Payer: Self-pay

## 2023-12-23 MED ORDER — FAMOTIDINE 20 MG PO TABS: 20.0000 mg | ORAL_TABLET | Freq: Every day | ORAL | 1 refills | Status: DC

## 2024-01-06 ENCOUNTER — Encounter: Payer: Self-pay | Admitting: Cardiology

## 2024-01-06 ENCOUNTER — Ambulatory Visit: Attending: Cardiology | Admitting: Cardiology

## 2024-01-06 VITALS — BP 132/80 | HR 75 | Ht 62.0 in | Wt 145.8 lb

## 2024-01-06 DIAGNOSIS — I10 Essential (primary) hypertension: Secondary | ICD-10-CM | POA: Diagnosis present

## 2024-01-06 DIAGNOSIS — R7303 Prediabetes: Secondary | ICD-10-CM

## 2024-01-06 DIAGNOSIS — Z8679 Personal history of other diseases of the circulatory system: Secondary | ICD-10-CM | POA: Diagnosis present

## 2024-01-06 DIAGNOSIS — Z952 Presence of prosthetic heart valve: Secondary | ICD-10-CM | POA: Diagnosis present

## 2024-01-06 DIAGNOSIS — R0609 Other forms of dyspnea: Secondary | ICD-10-CM | POA: Diagnosis present

## 2024-01-06 DIAGNOSIS — I4819 Other persistent atrial fibrillation: Secondary | ICD-10-CM

## 2024-01-06 HISTORY — DX: Other persistent atrial fibrillation: I48.19

## 2024-01-06 NOTE — Progress Notes (Unsigned)
 Cardiology Office Note:    Date:  01/06/2024   ID:  Maria Ingram, DOB [birth year], MRN [id number]  PCP:  Barbar Levine, MD  Cardiologist:  Ralene Burger, MD    Referring MD: Barbar Levine, MD   Chief Complaint  Patient presents with   Follow-up    History of Present Illness:    Maria Ingram is a 73 y.o. female past medical history significant for rheumatic heart disease status post mitral valve replacement on in [place] in 2000 essential valve, dyslipidemia, essential hypertension, prediabetes.  Comes today 2 months for follow-up overall doing fine.  Denies have any chest pain tightness squeezing pressure burning chest.  At the beginning of the year she suffered from pneumonia she was treated appropriately recover completely, however, reports to have some swelling of lower extremities especially at evening time.  No shortness of breath no palpitations  Past Medical History:  Diagnosis Date   Abnormal stress test 02/11/2018   Mid and apical portion of the anterior wall ischemia and stress test in July 2019 Cardiac catheterization after that showing normal coronaries   Anemia 11/10/2019   Blood transfusion without reported diagnosis 1981   post delivery of child   Borderline diabetes 02/08/2018   Cataract    bilateral sx   Diabetes mellitus without complication (HCC)    diet controlled- no meds   Dyslipidemia 02/08/2018   Essential hypertension 02/08/2018   GERD (gastroesophageal reflux disease)    H/O mitral valve replacement with mechanical valve 01/08/2015   [place] in [facility], [facility]  Formatting of this note might be different from the original. Done in [facility], [facility]   History of mitral valve prosthesis 02/08/2018   Done many years ago in [place]   History of rheumatic heart disease    History of strongyloidiasis    Hyperlipidemia    on meds   Hypertension    on meds   Osteoarthritis of left knee 09/13/2018   bilateral hands/legs   Pain in left  knee 05/20/2018   Pre-diabetes    Borderline   Pre-operative clearance 02/08/2018   Psoriasis    Seasonal allergies     Past Surgical History:  Procedure Laterality Date   BIOPSY  11/18/2021   Procedure: BIOPSY;  Surgeon: Lajuan Pila, MD;  [facility];  Service: Endoscopy;;  EGD and COLON   BREAST BIOPSY Right 11/25/2017   atrophic breast tissue with stromal fibrosis.   COLONOSCOPY WITH PROPOFOL  N/A 11/18/2021   Procedure: COLONOSCOPY WITH PROPOFOL ;  Surgeon: Lajuan Pila, MD;  Location: [facility];  Service: Endoscopy;  Laterality: N/A;   CORNEA LACERATION REPAIR Bilateral    ESOPHAGOGASTRODUODENOSCOPY (EGD) WITH PROPOFOL  N/A 11/18/2021   Procedure: ESOPHAGOGASTRODUODENOSCOPY (EGD) WITH PROPOFOL ;  Surgeon: Lajuan Pila, MD;  [facility];  Service: Endoscopy;  Laterality: N/A;   KNEE ARTHROSCOPY W/ MENISCAL REPAIR Right 2018   LEFT HEART CATH AND CORONARY ANGIOGRAPHY N/A 02/16/2018   Procedure: LEFT HEART CATH AND CORONARY ANGIOGRAPHY;  Surgeon: Swaziland, Peter M, MD;  Location: [facility] INVASIVE CV LAB;  Service: Cardiovascular;  Laterality: N/A;   POLYPECTOMY  11/18/2021   Procedure: POLYPECTOMY;  Surgeon: Lajuan Pila, MD;  [facility];  Service: Endoscopy;;   SUBMUCOSAL TATTOO INJECTION  11/18/2021   Procedure: SUBMUCOSAL TATTOO INJECTION;  Surgeon: Lajuan Pila, MD;  Location: [facility];  Service: Endoscopy;;   VALVE REPLACEMENT     St Jude mitral valve prosthesis    Current Medications: Current Meds  Medication Sig   atorvastatin (LIPITOR) 10  MG tablet Take 10 mg by mouth daily.   azithromycin  (ZITHROMAX ) 500 MG tablet 1 tableta una vez por dia por 3 dias. (Patient taking differently: Take 500 mg by mouth See admin instructions. 1 tableta una vez por dia por 3 dias.)   budesonide  (PULMICORT ) 0.5 MG/2ML nebulizer solution Take 2 mLs (0.5 mg total) by nebulization 2 (two) times daily.   citalopram  (CELEXA ) 10 MG tablet Take 10 mg by mouth  daily.   colestipol  (COLESTID ) 1 g tablet TAKE ONE TABLET BY MOUTH ONCE DAILY (Patient taking differently: Take 1 g by mouth daily. TAKE ONE TABLET BY MOUTH ONCE DAILY)   ENSTILAR 0.005-0.064 % FOAM Apply 1 application. topically 3 (three) times a week.   famotidine  (PEPCID ) 20 MG tablet Take 1 tablet (20 mg total) by mouth at bedtime. Please call [phone] to schedule an office visit for more refills   Finerenone (KERENDIA) 10 MG TABS Take 1 tablet by mouth daily.   ipratropium-albuterol  (DUONEB) 0.5-2.5 (3) MG/3ML SOLN Take 3 mLs by nebulization every 6 (six) hours as needed.   loratadine (CLARITIN) 10 MG tablet Take 10 mg by mouth daily.   losartan  (COZAAR ) 50 MG tablet TAKE ONE TABLET BY MOUTH EVERY DAY   memantine  (NAMENDA ) 10 MG tablet Take 1 tablet (10 mg total) by mouth 2 (two) times daily. APPOINTMENT NEEDED FOR FURTHER REFILLS   montelukast  (SINGULAIR ) 10 MG tablet Take 1 tablet (10 mg total) by mouth at bedtime.   SKYRIZI 150 MG/ML SOSY Inject 150 mg as directed every 3 (three) months.   triamcinolone cream (KENALOG) 0.5 % Apply 1 application. topically daily.   warfarin (COUMADIN) 1 MG tablet Take 1 mg by mouth See admin instructions. Take 1 mg by mouth daily except for Sunday   warfarin (COUMADIN) 3 MG tablet Take 3 mg by mouth daily.     Allergies:   Patient has no known allergies.   Social History   Socioeconomic History   Marital status: Married    Spouse name: Not on file   Number of children: Not on file   Years of education: Not on file   Highest education level: Not on file  Occupational History   Not on file  Tobacco Use   Smoking status: Never    Passive exposure: Never   Smokeless tobacco: Never  Vaping Use   Vaping status: Never Used  Substance and Sexual Activity   Alcohol use: Not Currently   Drug use: Never   Sexual activity: Not on file  Other Topics Concern   Not on file  Social History Narrative   Not on file   Social Drivers of Health    Financial Resource Strain: Not on file  Food Insecurity: Not on file  Transportation Needs: Not on file  Physical Activity: Not on file  Stress: Not on file  Social Connections: Not on file     Family History: The patient's family history includes Asthma in her daughter, father, and son; Colon cancer (age of onset: 26) in her father; Colon polyps (age of onset: 34) in her father; Emphysema in her mother and sister. There is no history of Esophageal cancer, Rectal cancer, Stomach cancer, Immunodeficiency, Eczema, Atopy, or Angioedema. ROS:   Please see the history of present illness.    All 14 point review of systems negative except as described per history of present illness  EKGs/Labs/Other Studies Reviewed:         Recent Labs: 04/07/2023: Hemoglobin 12.0; Platelets 295  Recent  Lipid Panel No results found for: "CHOL", "TRIG", "HDL", "CHOLHDL", "VLDL", "LDLCALC", "LDLDIRECT"  Physical Exam:    VS:  BP 132/80 (BP Location: Right Arm, Patient Position: Sitting)   Pulse 75   Ht 5\' 2"  (1.575 m)   Wt 145 lb 12.8 oz (66.1 kg)   SpO2 99%   BMI 26.67 kg/m     Wt Readings from Last 3 Encounters:  01/06/24 145 lb 12.8 oz (66.1 kg)  08/19/23 145 lb 12.8 oz (66.1 kg)  06/15/23 145 lb 12.8 oz (66.1 kg)     GEN:  Well nourished, well developed in no acute distress HEENT: Normal NECK: No JVD; No carotid bruits LYMPHATICS: No lymphadenopathy CARDIAC: Irregular irregular crisp mechanical valve sounds, no rubs, no gallops RESPIRATORY:  Clear to auscultation without rales, wheezing or rhonchi  ABDOMEN: Soft, non-tender, non-distended MUSCULOSKELETAL:  No edema; No deformity  SKIN: Warm and dry LOWER EXTREMITIES: no swelling NEUROLOGIC:  Alert and oriented x 3 PSYCHIATRIC:  Normal affect   ASSESSMENT:    1. Essential hypertension   2. Dyspnea on exertion   3. Persistent atrial fibrillation (HCC)   4. Borderline diabetes   5. H/O mitral valve replacement with mechanical  valve   6. History of rheumatic heart disease    PLAN:    In order of problems listed above:  Atrial fibrillation which is new diagnosis.  I will give her 3 to 4 weeks to see if she converted by herself if not we will make arrangements for potential cardioversion.  Will do echocardiogram to assess left ventricle ejection fraction atrial size as well as proBNP and Chem-7. Dyspnea exertion only mild overall seems to be doing fine. Essential hypertension blood pressure well-controlled continue present management.   Medication Adjustments/Labs and Tests Ordered: Current medicines are reviewed at length with the patient today.  Concerns regarding medicines are outlined above.  Orders Placed This Encounter  Procedures   Basic metabolic panel with GFR   Pro b natriuretic peptide (BNP)   EKG 12-Lead   ECHOCARDIOGRAM COMPLETE   Medication changes: No orders of the defined types were placed in this encounter.   Signed, Manfred Seed, MD, [facility] 01/06/2024 8:58 AM    [facility]

## 2024-01-06 NOTE — Patient Instructions (Addendum)
 Medication Instructions:  Your physician recommends that you continue on your current medications as directed. Please refer to the Current Medication list given to you today.  *If you need a refill on your cardiac medications before your next appointment, please call your pharmacy*   Lab Work: BMP, ProBNP-today If you have labs (blood work) drawn today and your tests are completely normal, you will receive your results only by: MyChart Message (if you have MyChart) OR A paper copy in the mail If you have any lab test that is abnormal or we need to change your treatment, we will call you to review the results.   Testing/Procedures: Your physician has requested that you have an echocardiogram. Echocardiography is a painless test that uses sound waves to create images of your heart. It provides your doctor with information about the size and shape of your heart and how well your heart's chambers and valves are working. This procedure takes approximately one hour. There are no restrictions for this procedure. Please do NOT wear cologne, perfume, aftershave, or lotions (deodorant is allowed). Please arrive 15 minutes prior to your appointment time.  Please note: We ask at that you not bring children with you during ultrasound (echo/ vascular) testing. Due to room size and safety concerns, children are not allowed in the ultrasound rooms during exams. Our front office staff cannot provide observation of children in our lobby area while testing is being conducted. An adult accompanying a patient to their appointment will only be allowed in the ultrasound room at the discretion of the ultrasound technician under special circumstances. We apologize for any inconvenience.    Follow-Up: At [facility], you and your health needs are our priority.  As part of our continuing mission to provide you with exceptional heart care, we have created designated Provider Care Teams.  These Care Teams include your  primary Cardiologist (physician) and Advanced Practice Providers (APPs -  Physician Assistants and Nurse Practitioners) who all work together to provide you with the care you need, when you need it.  We recommend signing up for the patient portal called "MyChart".  Sign up information is provided on this After Visit Summary.  MyChart is used to connect with patients for Virtual Visits (Telemedicine).  Patients are able to view lab/test results, encounter notes, upcoming appointments, etc.  Non-urgent messages can be sent to your provider as well.   To learn more about what you can do with MyChart, go to [email or web address].    Your next appointment:   1 month(s)  The format for your next appointment:   In Person  Provider:   Ralene Burger, MD    Other Instructions NA

## 2024-01-07 LAB — BASIC METABOLIC PANEL WITH GFR
BUN/Creatinine Ratio: 24 (ref 12–28)
BUN: 17 mg/dL (ref 8–27)
CO2: 23 mmol/L (ref 20–29)
Calcium: 9.4 mg/dL (ref 8.7–10.3)
Chloride: 101 mmol/L (ref 96–106)
Creatinine, Ser: 0.72 mg/dL (ref 0.57–1.00)
Glucose: 177 mg/dL — ABNORMAL HIGH (ref 70–99)
Potassium: 4.3 mmol/L (ref 3.5–5.2)
Sodium: 139 mmol/L (ref 134–144)
eGFR: 87 mL/min/{1.73_m2} (ref >59)

## 2024-01-07 LAB — PRO B NATRIURETIC PEPTIDE: NT-Pro BNP: 577 pg/mL (ref 0–738)

## 2024-01-15 ENCOUNTER — Ambulatory Visit: Payer: Self-pay | Admitting: Cardiology

## 2024-01-20 ENCOUNTER — Telehealth: Payer: Self-pay

## 2024-01-20 ENCOUNTER — Other Ambulatory Visit (HOSPITAL_COMMUNITY): Payer: Self-pay

## 2024-01-20 NOTE — Telephone Encounter (Signed)
 Pharmacy Patient Advocate Encounter   Received notification from CoverMyMeds that prior authorization for Budesonide  0.5MG /2ML suspension  is required/requested.   Insurance verification completed.   The patient is insured through [employer] .   Per test claim: Medication is not eligible for pharmacy benefits and must be billed through medical insurance. As our team only handles pharmacy related prior auths, medical PA's must be submitted by the clinic. Thank you

## 2024-02-04 ENCOUNTER — Other Ambulatory Visit

## 2024-02-09 ENCOUNTER — Ambulatory Visit: Attending: Cardiology

## 2024-02-09 DIAGNOSIS — R0609 Other forms of dyspnea: Secondary | ICD-10-CM | POA: Diagnosis present

## 2024-02-10 LAB — ECHOCARDIOGRAM COMPLETE
Area-P 1/2: 3.08 cm2
MV VTI: 1.33 cm2
S' Lateral: 3.3 cm

## 2024-02-10 MED ORDER — BUDESONIDE 0.5 MG/2ML IN SUSP: 0.5000 mg | Freq: Two times a day (BID) | RESPIRATORY_TRACT | 5 refills | Status: DC

## 2024-02-10 NOTE — Telephone Encounter (Signed)
Left voicemail for patients daughter to return call. 

## 2024-02-10 NOTE — Telephone Encounter (Signed)
 Spoke with patient and Budesonide  rx has been sent in to [facility] on [address]

## 2024-02-10 NOTE — Addendum Note (Signed)
 Addended by: Johntavious Francom on: 02/10/2024 04:33 PM   Modules accepted: Orders

## 2024-02-12 ENCOUNTER — Ambulatory Visit: Attending: Cardiology | Admitting: Cardiology

## 2024-02-12 ENCOUNTER — Encounter: Payer: Self-pay | Admitting: Cardiology

## 2024-02-12 VITALS — BP 126/76 | HR 88 | Ht 61.0 in | Wt 144.4 lb

## 2024-02-12 DIAGNOSIS — I4819 Other persistent atrial fibrillation: Secondary | ICD-10-CM | POA: Insufficient documentation

## 2024-02-12 DIAGNOSIS — R7303 Prediabetes: Secondary | ICD-10-CM | POA: Insufficient documentation

## 2024-02-12 DIAGNOSIS — E782 Mixed hyperlipidemia: Secondary | ICD-10-CM | POA: Insufficient documentation

## 2024-02-12 DIAGNOSIS — I10 Essential (primary) hypertension: Secondary | ICD-10-CM | POA: Insufficient documentation

## 2024-02-12 DIAGNOSIS — Z952 Presence of prosthetic heart valve: Secondary | ICD-10-CM | POA: Insufficient documentation

## 2024-02-12 NOTE — Progress Notes (Unsigned)
 Cardiology Office Note:    Date:  02/12/2024   ID:  Maria Ingram, DOB [birth year], MRN [id number]  PCP:  Trinidad Glisson, MD  Cardiologist:  Lamar Fitch, MD    Referring MD: Trinidad Glisson, MD   Chief Complaint  Patient presents with   Follow-up    History of Present Illness:    Maria Ingram is a 73 y.o. female past medical history significant for rheumatic heart disease, status post mitral valve replacement done in [place] in 2000, it is send due to valve, size unknown, additional problem include essential hypertension, prediabetes, dyslipidemia.  Recently she ended up being sick with pneumonia required to antibiotic and bronchodilators.  After that she flipped to atrial fibrillation.  This is first episode of atrial fibrillation since I know her.  Overall she is doing fine she does have some swelling of lower extremities, recent echocardiogram showed ejection fraction 5055%, valve seems to be functioning properly left atrium is moderately enlarged.  She comes here today to talk about potential cardioversion.  She denies have any palpitations no dizziness no passing out  Past Medical History:  Diagnosis Date   Abnormal stress test 02/11/2018   Mid and apical portion of the anterior wall ischemia and stress test in July 2019 Cardiac catheterization after that showing normal coronaries   Anemia 11/10/2019   Blood transfusion without reported diagnosis 1981   post delivery of child   Borderline diabetes 02/08/2018   Cataract    bilateral sx   Diabetes mellitus without complication (HCC)    diet controlled- no meds   Dyslipidemia 02/08/2018   Essential hypertension 02/08/2018   GERD (gastroesophageal reflux disease)    H/O mitral valve replacement with mechanical valve 01/08/2015   Done in [facility], [facility]  Formatting of this note might be different from the original. Done in [facility], [facility]   History of mitral valve prosthesis 02/08/2018   Done many years  ago in [place]   History of rheumatic heart disease    History of strongyloidiasis    Hyperlipidemia    on meds   Hypertension    on meds   Osteoarthritis of left knee 09/13/2018   bilateral hands/legs   Pain in left knee 05/20/2018   Pre-diabetes    Borderline   Pre-operative clearance 02/08/2018   Psoriasis    Seasonal allergies     Past Surgical History:  Procedure Laterality Date   BIOPSY  11/18/2021   Procedure: BIOPSY;  Surgeon: Charlanne Groom, MD;  Location: [facility];  Service: Endoscopy;;  EGD and COLON   BREAST BIOPSY Right 11/25/2017   atrophic breast tissue with stromal fibrosis.   COLONOSCOPY WITH PROPOFOL  N/A 11/18/2021   Procedure: COLONOSCOPY WITH PROPOFOL ;  Surgeon: Charlanne Groom, MD;  Location: [facility];  Service: Endoscopy;  Laterality: N/A;   CORNEA LACERATION REPAIR Bilateral    ESOPHAGOGASTRODUODENOSCOPY (EGD) WITH PROPOFOL  N/A 11/18/2021   Procedure: ESOPHAGOGASTRODUODENOSCOPY (EGD) WITH PROPOFOL ;  Surgeon: Charlanne Groom, MD;  Location: [facility];  Service: Endoscopy;  Laterality: N/A;   KNEE ARTHROSCOPY W/ MENISCAL REPAIR Right 2018   LEFT HEART CATH AND CORONARY ANGIOGRAPHY N/A 02/16/2018   Procedure: LEFT HEART CATH AND CORONARY ANGIOGRAPHY;  Surgeon: Swaziland, Peter M, MD;  Location: [facility] INVASIVE CV LAB;  Service: Cardiovascular;  Laterality: N/A;   POLYPECTOMY  11/18/2021   Procedure: POLYPECTOMY;  Surgeon: Charlanne Groom, MD;  Location: [facility];  Service: Endoscopy;;   SUBMUCOSAL TATTOO INJECTION  11/18/2021   Procedure: SUBMUCOSAL TATTOO INJECTION;  Surgeon: Charlanne,  Lynnie, MD;  Location: [facility];  Service: Endoscopy;;   VALVE REPLACEMENT     St Jude mitral valve prosthesis    Current Medications: Current Meds  Medication Sig   atorvastatin (LIPITOR) 10 MG tablet Take 10 mg by mouth daily.   azithromycin  (ZITHROMAX ) 500 MG tablet 1 tableta una vez por dia por 3 dias. (Patient taking differently: Take 500 mg by mouth See admin  instructions. 1 tableta una vez por dia por 3 dias.)   budesonide  (PULMICORT ) 0.5 MG/2ML nebulizer solution Take 2 mLs (0.5 mg total) by nebulization 2 (two) times daily.   citalopram  (CELEXA ) 10 MG tablet Take 10 mg by mouth daily.   colestipol  (COLESTID ) 1 g tablet TAKE ONE TABLET BY MOUTH ONCE DAILY (Patient taking differently: Take 1 g by mouth daily. TAKE ONE TABLET BY MOUTH ONCE DAILY)   ENSTILAR 0.005-0.064 % FOAM Apply 1 application. topically 3 (three) times a week.   famotidine  (PEPCID ) 20 MG tablet Take 1 tablet (20 mg total) by mouth at bedtime. Please call [phone] to schedule an office visit for more refills   Finerenone (KERENDIA) 10 MG TABS Take 1 tablet by mouth daily.   ipratropium-albuterol  (DUONEB) 0.5-2.5 (3) MG/3ML SOLN Take 3 mLs by nebulization every 6 (six) hours as needed.   loratadine (CLARITIN) 10 MG tablet Take 10 mg by mouth daily.   losartan  (COZAAR ) 50 MG tablet TAKE ONE TABLET BY MOUTH EVERY DAY   memantine  (NAMENDA ) 10 MG tablet Take 1 tablet (10 mg total) by mouth 2 (two) times daily. APPOINTMENT NEEDED FOR FURTHER REFILLS   montelukast  (SINGULAIR ) 10 MG tablet Take 1 tablet (10 mg total) by mouth at bedtime.   SKYRIZI 150 MG/ML SOSY Inject 150 mg as directed every 3 (three) months.   triamcinolone cream (KENALOG) 0.5 % Apply 1 application. topically daily.   warfarin (COUMADIN) 1 MG tablet Take 1 mg by mouth See admin instructions. Take 1 mg by mouth daily except for Sunday   warfarin (COUMADIN) 3 MG tablet Take 3 mg by mouth daily.     Allergies:   Patient has no known allergies.   Social History   Socioeconomic History   Marital status: Married    Spouse name: Not on file   Number of children: Not on file   Years of education: Not on file   Highest education level: Not on file  Occupational History   Not on file  Tobacco Use   Smoking status: Never    Passive exposure: Never   Smokeless tobacco: Never  Vaping Use   Vaping status: Never Used   Substance and Sexual Activity   Alcohol use: Not Currently   Drug use: Never   Sexual activity: Not on file  Other Topics Concern   Not on file  Social History Narrative   Not on file   Social Drivers of Health   Financial Resource Strain: Not on file  Food Insecurity: Not on file  Transportation Needs: Not on file  Physical Activity: Not on file  Stress: Not on file  Social Connections: Not on file     Family History: The patient's family history includes Asthma in her daughter, father, and son; Colon cancer (age of onset: 105) in her father; Colon polyps (age of onset: 16) in her father; Emphysema in her mother and sister. There is no history of Esophageal cancer, Rectal cancer, Stomach cancer, Immunodeficiency, Eczema, Atopy, or Angioedema. ROS:   Please see the history of present illness.  All 14 point review of systems negative except as described per history of present illness  EKGs/Labs/Other Studies Reviewed:         Recent Labs: 04/07/2023: Hemoglobin 12.0; Platelets 295 01/06/2024: BUN 17; Creatinine, Ser 0.72; NT-Pro BNP 577; Potassium 4.3; Sodium 139  Recent Lipid Panel No results found for: CHOL, TRIG, HDL, CHOLHDL, VLDL, LDLCALC, LDLDIRECT  Physical Exam:    VS:  BP 126/76 (BP Location: Right Arm, Patient Position: Sitting)   Pulse 88   Ht 5' 1 (1.549 m)   Wt 144 lb 6.4 oz (65.5 kg)   SpO2 95%   BMI 27.28 kg/m     Wt Readings from Last 3 Encounters:  02/12/24 144 lb 6.4 oz (65.5 kg)  01/06/24 145 lb 12.8 oz (66.1 kg)  08/19/23 145 lb 12.8 oz (66.1 kg)     GEN:  Well nourished, well developed in no acute distress HEENT: Normal NECK: No JVD; No carotid bruits LYMPHATICS: No lymphadenopathy CARDIAC: Irregularly irregular, crisp valve sounds, no murmurs, no rubs, no gallops RESPIRATORY:  Clear to auscultation without rales, wheezing or rhonchi  ABDOMEN: Soft, non-tender, non-distended MUSCULOSKELETAL:  No edema; No deformity  SKIN:  Warm and dry LOWER EXTREMITIES: no swelling NEUROLOGIC:  Alert and oriented x 3 PSYCHIATRIC:  Normal affect   ASSESSMENT:    1. Persistent atrial fibrillation (HCC)   2. Essential hypertension   3. H/O mitral valve replacement with mechanical valve   4. Mixed hyperlipidemia   5. Pre-diabetes    PLAN:    In order of problems listed above:  Status post mitral valve replacement with eccentric prosthesis done in [place] in 2000, last echocardiogram done just few weeks ago showed normally functioning valve. Atrial fibrillation persistent.  I had a long discussion with him about options for the situation I think the best option will be to perform cardioversion however because she does have essentially the valve it would be reasonable to perform TEE and cardioversion, will make arrangement to do the procedure at [facility].  Procedure was explained to her including all risk benefits as well as alternative.  On top of that she did have it done before.  I do not see a role for antiarrhythmic yet.  Will see how she behave after cardioversion if that be recurrences of atrial fibrillation she will be referred to our EP team for consideration of ablation versus antiarrhythmic therapy. Mixed dyslipidemia I did review her K PN which show me LDL 51 HDL 50 well-controlled, she is on Lipitor 10 which I will continue.  So far we have no evidence of coronary disease. Anticoagulation with Coumadin because of mechanical mitral valve.   Medication Adjustments/Labs and Tests Ordered: Current medicines are reviewed at length with the patient today.  Concerns regarding medicines are outlined above.  No orders of the defined types were placed in this encounter.  Medication changes: No orders of the defined types were placed in this encounter.   Signed, Lamar DOROTHA Fitch, MD, [facility] 02/12/2024 8:33 AM    [facility]

## 2024-02-12 NOTE — H&P (View-Only) (Signed)
 Cardiology Office Note:    Date:  02/12/2024   ID:  Maria Ingram, DOB [birth year], MRN [id number]  PCP:  Trinidad Glisson, MD  Cardiologist:  Lamar Fitch, MD    Referring MD: Trinidad Glisson, MD   Chief Complaint  Patient presents with   Follow-up    History of Present Illness:    Maria Ingram is a 73 y.o. female past medical history significant for rheumatic heart disease, status post mitral valve replacement done in [place] in 2000, it is send due to valve, size unknown, additional problem include essential hypertension, prediabetes, dyslipidemia.  Recently she ended up being sick with pneumonia required to antibiotic and bronchodilators.  After that she flipped to atrial fibrillation.  This is first episode of atrial fibrillation since I know her.  Overall she is doing fine she does have some swelling of lower extremities, recent echocardiogram showed ejection fraction 5055%, valve seems to be functioning properly left atrium is moderately enlarged.  She comes here today to talk about potential cardioversion.  She denies have any palpitations no dizziness no passing out  Past Medical History:  Diagnosis Date   Abnormal stress test 02/11/2018   Mid and apical portion of the anterior wall ischemia and stress test in July 2019 Cardiac catheterization after that showing normal coronaries   Anemia 11/10/2019   Blood transfusion without reported diagnosis 1981   post delivery of child   Borderline diabetes 02/08/2018   Cataract    bilateral sx   Diabetes mellitus without complication (HCC)    diet controlled- no meds   Dyslipidemia 02/08/2018   Essential hypertension 02/08/2018   GERD (gastroesophageal reflux disease)    H/O mitral valve replacement with mechanical valve 01/08/2015   [place] in [facility], [facility]  Formatting of this note might be different from the original. Done in [facility], [facility]   History of mitral valve prosthesis 02/08/2018   Done many years  ago in [place]   History of rheumatic heart disease    History of strongyloidiasis    Hyperlipidemia    on meds   Hypertension    on meds   Osteoarthritis of left knee 09/13/2018   bilateral hands/legs   Pain in left knee 05/20/2018   Pre-diabetes    Borderline   Pre-operative clearance 02/08/2018   Psoriasis    Seasonal allergies     Past Surgical History:  Procedure Laterality Date   BIOPSY  11/18/2021   Procedure: BIOPSY;  Surgeon: Charlanne Groom, MD;  [facility];  Service: Endoscopy;;  EGD and COLON   BREAST BIOPSY Right 11/25/2017   atrophic breast tissue with stromal fibrosis.   COLONOSCOPY WITH PROPOFOL  N/A 11/18/2021   Procedure: COLONOSCOPY WITH PROPOFOL ;  Surgeon: Charlanne Groom, MD;  Location: [facility];  Service: Endoscopy;  Laterality: N/A;   CORNEA LACERATION REPAIR Bilateral    ESOPHAGOGASTRODUODENOSCOPY (EGD) WITH PROPOFOL  N/A 11/18/2021   Procedure: ESOPHAGOGASTRODUODENOSCOPY (EGD) WITH PROPOFOL ;  Surgeon: Charlanne Groom, MD;  [facility];  Service: Endoscopy;  Laterality: N/A;   KNEE ARTHROSCOPY W/ MENISCAL REPAIR Right 2018   LEFT HEART CATH AND CORONARY ANGIOGRAPHY N/A 02/16/2018   Procedure: LEFT HEART CATH AND CORONARY ANGIOGRAPHY;  Surgeon: Swazil[place], Peter M, MD;  Location: [facility] INVASIVE CV LAB;  Service: Cardiovascular;  Laterality: N/A;   POLYPECTOMY  11/18/2021   Procedure: POLYPECTOMY;  Surgeon: Charlanne Groom, MD;  [facility];  Service: Endoscopy;;   SUBMUCOSAL TATTOO INJECTION  11/18/2021   Procedure: SUBMUCOSAL TATTOO INJECTION;  Surgeon: Charlanne,  Lynnie, MD;  [facility]: [facility];  Service: Endoscopy;;   VALVE REPLACEMENT     St Jude mitral valve prosthesis    Current Medications: Current Meds  Medication Sig   atorvastatin (LIPITOR) 10 MG tablet Take 10 mg by mouth daily.   azithromycin  (ZITHROMAX ) 500 MG tablet 1 tableta una vez por dia por 3 dias. (Patient taking differently: Take 500 mg by mouth See admin  instructions. 1 tableta una vez por dia por 3 dias.)   budesonide  (PULMICORT ) 0.5 MG/2ML nebulizer solution Take 2 mLs (0.5 mg total) by nebulization 2 (two) times daily.   citalopram  (CELEXA ) 10 MG tablet Take 10 mg by mouth daily.   colestipol  (COLESTID ) 1 g tablet TAKE ONE TABLET BY MOUTH ONCE DAILY (Patient taking differently: Take 1 g by mouth daily. TAKE ONE TABLET BY MOUTH ONCE DAILY)   ENSTILAR 0.005-0.064 % FOAM Apply 1 application. topically 3 (three) times a week.   famotidine  (PEPCID ) 20 MG tablet Take 1 tablet (20 mg total) by mouth at bedtime. Please call [phone] to schedule an office visit for more refills   Finerenone (KERENDIA) 10 MG TABS Take 1 tablet by mouth daily.   ipratropium-albuterol  (DUONEB) 0.5-2.5 (3) MG/3ML SOLN Take 3 mLs by nebulization every 6 (six) hours as needed.   loratadine (CLARITIN) 10 MG tablet Take 10 mg by mouth daily.   losartan  (COZAAR ) 50 MG tablet TAKE ONE TABLET BY MOUTH EVERY DAY   memantine  (NAMENDA ) 10 MG tablet Take 1 tablet (10 mg total) by mouth 2 (two) times daily. APPOINTMENT NEEDED FOR FURTHER REFILLS   montelukast  (SINGULAIR ) 10 MG tablet Take 1 tablet (10 mg total) by mouth at bedtime.   SKYRIZI 150 MG/ML SOSY Inject 150 mg as directed every 3 (three) months.   triamcinolone cream (KENALOG) 0.5 % Apply 1 application. topically daily.   warfarin (COUMADIN) 1 MG tablet Take 1 mg by mouth See admin instructions. Take 1 mg by mouth daily except for Sunday   warfarin (COUMADIN) 3 MG tablet Take 3 mg by mouth daily.     Allergies:   Patient has no known allergies.   Social History   Socioeconomic History   Marital status: Married    Spouse name: Not on file   Number of children: Not on file   Years of education: Not on file   Highest education level: Not on file  Occupational History   Not on file  Tobacco Use   Smoking status: Never    Passive exposure: Never   Smokeless tobacco: Never  Vaping Use   Vaping status: Never Used   Substance and Sexual Activity   Alcohol use: Not Currently   Drug use: Never   Sexual activity: Not on file  Other Topics Concern   Not on file  Social History Narrative   Not on file   Social Drivers of Health   Financial Resource Strain: Not on file  Food Insecurity: Not on file  Transportation Needs: Not on file  Physical Activity: Not on file  Stress: Not on file  Social Connections: Not on file     Family History: The patient's family history includes Asthma in her daughter, father, and son; Colon cancer (age of onset: 105) in her father; Colon polyps (age of onset: 16) in her father; Emphysema in her mother and sister. There is no history of Esophageal cancer, Rectal cancer, Stomach cancer, Immunodeficiency, Eczema, Atopy, or Angioedema. ROS:   Please see the history of present illness.  All 14 point review of systems negative except as described per history of present illness  EKGs/Labs/Other Studies Reviewed:         Recent Labs: 04/07/2023: Hemoglobin 12.0; Platelets 295 01/06/2024: BUN 17; Creatinine, Ser 0.72; NT-Pro BNP 577; Potassium 4.3; Sodium 139  Recent Lipid Panel No results found for: CHOL, TRIG, HDL, CHOLHDL, VLDL, LDLCALC, LDLDIRECT  Physical Exam:    VS:  BP 126/76 (BP Location: Right Arm, Patient Position: Sitting)   Pulse 88   Ht 5' 1 (1.549 m)   Wt 144 lb 6.4 oz (65.5 kg)   SpO2 95%   BMI 27.28 kg/m     Wt Readings from Last 3 Encounters:  02/12/24 144 lb 6.4 oz (65.5 kg)  01/06/24 145 lb 12.8 oz (66.1 kg)  08/19/23 145 lb 12.8 oz (66.1 kg)     GEN:  Well nourished, well developed in no acute distress HEENT: Normal NECK: No JVD; No carotid bruits LYMPHATICS: No lymphadenopathy CARDIAC: Irregularly irregular, crisp valve sounds, no murmurs, no rubs, no gallops RESPIRATORY:  Clear to auscultation without rales, wheezing or rhonchi  ABDOMEN: Soft, non-tender, non-distended MUSCULOSKELETAL:  No edema; No deformity  SKIN:  Warm and dry LOWER EXTREMITIES: no swelling NEUROLOGIC:  Alert and oriented x 3 PSYCHIATRIC:  Normal affect   ASSESSMENT:    1. Persistent atrial fibrillation (HCC)   2. Essential hypertension   3. H/O mitral valve replacement with mechanical valve   4. Mixed hyperlipidemia   5. Pre-diabetes    PLAN:    In order of problems listed above:  Status post mitral valve replacement with eccentric prosthesis done in [place] in 2000, last echocardiogram done just few weeks ago showed normally functioning valve. Atrial fibrillation persistent.  I had a long discussion with him about options for the situation I think the best option will be to perform cardioversion however because she does have essentially the valve it would be reasonable to perform TEE and cardioversion, will make arrangement to do the procedure at [facility].  Procedure was explained to her including all risk benefits as well as alternative.  On top of that she did have it done before.  I do not see a role for antiarrhythmic yet.  Will see how she behave after cardioversion if that be recurrences of atrial fibrillation she will be referred to our EP team for consideration of ablation versus antiarrhythmic therapy. Mixed dyslipidemia I did review her K PN which show me LDL 51 HDL 50 well-controlled, she is on Lipitor 10 which I will continue.  So far we have no evidence of coronary disease. Anticoagulation with Coumadin because of mechanical mitral valve.   Medication Adjustments/Labs and Tests Ordered: Current medicines are reviewed at length with the patient today.  Concerns regarding medicines are outlined above.  No orders of the defined types were placed in this encounter.  Medication changes: No orders of the defined types were placed in this encounter.   Signed, Lamar DOROTHA Fitch, MD, [facility] 02/12/2024 8:33 AM    [facility]

## 2024-02-12 NOTE — Patient Instructions (Signed)
 Medication Instructions:  Your physician recommends that you continue on your current medications as directed. Please refer to the Current Medication list given to you today.  *If you need a refill on your cardiac medications before your next appointment, please call your pharmacy*   Lab Work: None Ordered If you have labs (blood work) drawn today and your tests are completely normal, you will receive your results only by: MyChart Message (if you have MyChart) OR A paper copy in the mail If you have any lab test that is abnormal or we need to change your treatment, we will call you to review the results.   Testing/Procedures:    Dear Maria Ingram  You are scheduled for a TEE (Transesophageal Echocardiogram) Guided Cardioversion on Thursday, July 17 with Dr. Lonni.  Please arrive at the [facility] (Main Entrance A) at [facility]: [address] [place], [place] [address] at 8:30 AM (This time is 1.5 hour(s) before your procedure to ensure your preparation).   Free valet parking service is available. You will check in at ADMITTING.   *Please Note: You will receive a call the day before your procedure to confirm the appointment time. That time may have changed from the original time based on the schedule for that day.*   DIET:  Nothing to eat or drink after midnight except a sip of water with medications (see medication instructions below)   Continue taking your anticoagulant (blood thinner): Warfarin (Coumadin).  You will need to continue this after your procedure until you are told by your provider that it is safe to stop.    LABS: BMP, CBC, PT/INR 3 days prior to test  FYI:  For your safety, and to allow us  to monitor your vital signs accurately during the surgery/procedure we request: If you have artificial nails, gel coating, SNS etc, please have those removed prior to your surgery/procedure. Not having the nail coverings /polish removed may result in  cancellation or delay of your surgery/procedure.  Your support person will be asked to wait in the waiting room during your procedure.  It is OK to have someone drop you off and come back when you are ready to be discharged.  You cannot drive after the procedure and will need someone to drive you home.  Bring your insurance cards.  *Special Note: Every effort is made to have your procedure done on time. Occasionally there are emergencies that occur at the hospital that may cause delays. Please be patient if a delay does occur.       Follow-Up: At [facility], you and your health needs are our priority.  As part of our continuing mission to provide you with exceptional heart care, we have created designated Provider Care Teams.  These Care Teams include your primary Cardiologist (physician) and Advanced Practice Providers (APPs -  Physician Assistants and Nurse Practitioners) who all work together to provide you with the care you need, when you need it.  We recommend signing up for the patient portal called MyChart.  Sign up information is provided on this After Visit Summary.  MyChart is used to connect with patients for Virtual Visits (Telemedicine).  Patients are able to view lab/test results, encounter notes, upcoming appointments, etc.  Non-urgent messages can be sent to your provider as well.   To learn more about what you can do with MyChart, go to [email or web address].    Your next appointment:   2 month(s)  The format for your next appointment:  In Person  Provider:   Lamar Fitch, MD    Other Instructions NA

## 2024-02-16 LAB — CBC
Hematocrit: 37.4 % (ref 34.0–46.6)
Hemoglobin: 11.7 g/dL (ref 11.1–15.9)
MCH: 29.5 pg (ref 26.6–33.0)
MCHC: 31.3 g/dL — ABNORMAL LOW (ref 31.5–35.7)
MCV: 94 fL (ref 79–97)
Platelets: 221 x10E3/uL (ref 150–450)
RBC: 3.96 x10E6/uL (ref 3.77–5.28)
RDW: 13.1 % (ref 11.7–15.4)
WBC: 5.4 x10E3/uL (ref 3.4–10.8)

## 2024-02-16 LAB — BASIC METABOLIC PANEL WITH GFR
BUN/Creatinine Ratio: 23 (ref 12–28)
BUN: 16 mg/dL (ref 8–27)
CO2: 21 mmol/L (ref 20–29)
Calcium: 8.8 mg/dL (ref 8.7–10.3)
Chloride: 102 mmol/L (ref 96–106)
Creatinine, Ser: 0.69 mg/dL (ref 0.57–1.00)
Glucose: 125 mg/dL — ABNORMAL HIGH (ref 70–99)
Potassium: 4.3 mmol/L (ref 3.5–5.2)
Sodium: 140 mmol/L (ref 134–144)
eGFR: 90 mL/min/1.73 (ref >59)

## 2024-02-17 NOTE — Progress Notes (Signed)
 Pt called for pre procedure instructions.  Spoke with daughter who speaks english. Arrival time 0815 NPO after midnight explained Instructed to take am meds with sip of water and confirmed blood thinner consistency. On coumadin. Instructed pt need for ride home tomorrow and have responsible adult with them for 24 hrs post procedure.

## 2024-02-18 ENCOUNTER — Ambulatory Visit (HOSPITAL_COMMUNITY): Admitting: Certified Registered Nurse Anesthetist

## 2024-02-18 ENCOUNTER — Ambulatory Visit (HOSPITAL_COMMUNITY)
Admission: RE | Admit: 2024-02-18 | Discharge: 2024-02-18 | Disposition: A | Discharge status: Home / Self Care | Attending: Cardiology | Admitting: Cardiology

## 2024-02-18 ENCOUNTER — Encounter (HOSPITAL_COMMUNITY)
Admission: RE | Disposition: A | Discharge status: Home / Self Care | Payer: Self-pay | Source: Home / Self Care | Attending: Cardiology

## 2024-02-18 ENCOUNTER — Other Ambulatory Visit: Payer: Self-pay

## 2024-02-18 ENCOUNTER — Encounter (HOSPITAL_COMMUNITY): Payer: Self-pay | Admitting: Cardiology

## 2024-02-18 ENCOUNTER — Ambulatory Visit (HOSPITAL_BASED_OUTPATIENT_CLINIC_OR_DEPARTMENT_OTHER)
Admission: RE | Admit: 2024-02-18 | Discharge: 2024-02-18 | Disposition: A | Discharge status: Home / Self Care | Source: Ambulatory Visit | Attending: Cardiology | Admitting: Cardiology

## 2024-02-18 DIAGNOSIS — E785 Hyperlipidemia, unspecified: Secondary | ICD-10-CM

## 2024-02-18 DIAGNOSIS — I700 Atherosclerosis of aorta: Secondary | ICD-10-CM | POA: Insufficient documentation

## 2024-02-18 DIAGNOSIS — I071 Rheumatic tricuspid insufficiency: Secondary | ICD-10-CM | POA: Insufficient documentation

## 2024-02-18 DIAGNOSIS — I10 Essential (primary) hypertension: Secondary | ICD-10-CM | POA: Diagnosis not present

## 2024-02-18 DIAGNOSIS — R7303 Prediabetes: Secondary | ICD-10-CM | POA: Insufficient documentation

## 2024-02-18 DIAGNOSIS — I358 Other nonrheumatic aortic valve disorders: Secondary | ICD-10-CM | POA: Diagnosis not present

## 2024-02-18 DIAGNOSIS — Z952 Presence of prosthetic heart valve: Secondary | ICD-10-CM | POA: Insufficient documentation

## 2024-02-18 DIAGNOSIS — I4891 Unspecified atrial fibrillation: Secondary | ICD-10-CM

## 2024-02-18 DIAGNOSIS — I361 Nonrheumatic tricuspid (valve) insufficiency: Secondary | ICD-10-CM

## 2024-02-18 DIAGNOSIS — E782 Mixed hyperlipidemia: Secondary | ICD-10-CM | POA: Insufficient documentation

## 2024-02-18 DIAGNOSIS — Z79899 Other long term (current) drug therapy: Secondary | ICD-10-CM | POA: Insufficient documentation

## 2024-02-18 DIAGNOSIS — Z7901 Long term (current) use of anticoagulants: Secondary | ICD-10-CM | POA: Diagnosis not present

## 2024-02-18 DIAGNOSIS — I4819 Other persistent atrial fibrillation: Secondary | ICD-10-CM | POA: Insufficient documentation

## 2024-02-18 HISTORY — PX: CARDIOVERSION: EP1203

## 2024-02-18 HISTORY — PX: TRANSESOPHAGEAL ECHOCARDIOGRAM (CATH LAB): EP1270

## 2024-02-18 LAB — ECHO TEE: Est EF: NORMAL

## 2024-02-18 LAB — PROTIME-INR
INR: 2.7 — ABNORMAL HIGH (ref 0.8–1.2)
Prothrombin Time: 29.7 s — ABNORMAL HIGH (ref 11.4–15.2)

## 2024-02-18 SURGERY — TRANSESOPHAGEAL ECHOCARDIOGRAM (TEE) (CATHLAB)
Anesthesia: General

## 2024-02-18 MED ORDER — PROPOFOL 10 MG/ML IV BOLUS
INTRAVENOUS | Status: DC | PRN
  Administered 2024-02-18: 50 mg via INTRAVENOUS

## 2024-02-18 MED ORDER — PROPOFOL 500 MG/50ML IV EMUL
INTRAVENOUS | Status: DC | PRN
  Administered 2024-02-18: 75 ug/kg/min via INTRAVENOUS

## 2024-02-18 MED ORDER — SODIUM CHLORIDE 0.9 % IV SOLN: INTRAVENOUS | Status: DC

## 2024-02-18 SURGICAL SUPPLY — 1 items: PAD DEFIB RADIO PHYSIO CONN (PAD) ×1 IMPLANT

## 2024-02-18 NOTE — Anesthesia Preprocedure Evaluation (Signed)
 Anesthesia Evaluation  Patient identified by MRN, date of birth, ID band Patient awake    Reviewed: Allergy  & Precautions, NPO status , Patient's Chart, lab work & pertinent test results  Airway Mallampati: II  TM Distance: >3 FB Neck ROM: Full    Dental  (+) Teeth Intact, Dental Advisory Given   Pulmonary neg pulmonary ROS   Pulmonary exam normal breath sounds clear to auscultation       Cardiovascular hypertension, + dysrhythmias Atrial Fibrillation + Valvular Problems/Murmurs (H/O mitral valve replacement with mechanical valve)  Rhythm:Irregular Rate:Abnormal     Neuro/Psych negative neurological ROS  negative psych ROS   GI/Hepatic Neg liver ROS,GERD  ,,  Endo/Other  negative endocrine ROSdiabetes    Renal/GU negative Renal ROS     Musculoskeletal  (+) Arthritis ,    Abdominal   Peds  Hematology negative hematology ROS (+)   Anesthesia Other Findings Day of surgery medications reviewed with the patient.  Reproductive/Obstetrics                              Anesthesia Physical Anesthesia Plan  ASA: 3  Anesthesia Plan: General   Post-op Pain Management: Minimal or no pain anticipated   Induction: Intravenous  PONV Risk Score and Plan: 3 and TIVA  Airway Management Planned: Natural Airway and Simple Face Mask  Additional Equipment:   Intra-op Plan:   Post-operative Plan:   Informed Consent: I have reviewed the patients History and Physical, chart, labs and discussed the procedure including the risks, benefits and alternatives for the proposed anesthesia with the patient or authorized representative who has indicated his/her understanding and acceptance.     Dental advisory given and Interpreter used for interview  Plan Discussed with: CRNA  Anesthesia Plan Comments:         Anesthesia Quick Evaluation

## 2024-02-18 NOTE — CV Procedure (Signed)
   TRANSESOPHAGEAL ECHOCARDIOGRAM GUIDED DIRECT CURRENT CARDIOVERSION  NAME:  Maria Ingram   MRN: [id number] DOB:  1951/04/18   ADMIT DATE: 02/18/2024  INDICATIONS: Symptomatic atrial fibrillation  PROCEDURE:   Informed consent was obtained prior to the procedure. The risks, benefits and alternatives for the procedure were discussed and the patient comprehended these risks.  Risks include, but are not limited to, cough, sore throat, vomiting, nausea, somnolence, esophageal and stomach trauma or perforation, bleeding, low blood pressure, aspiration, pneumonia, infection, trauma to the teeth and death.    After a procedural time-out, the patient was sedated by the anesthesia service. The transesophageal probe was inserted in the esophagus and stomach without difficulty and multiple views were obtained. Anesthesia was monitored by Dr. Corinne. Patient received and 193 mg IV propofol .  COMPLICATIONS:    Complications: No complications Patient tolerated procedure well.  FINDINGS:  LEFT VENTRICLE: EF not well visualized due to shadowing from mechanical mitral valve. Appears grossly normal.  RIGHT VENTRICLE: Normal size and function.   LEFT ATRIUM: No thrombus/mass. Moderately dilated.  LEFT ATRIAL APPENDAGE: LAA not visualized, question if surgically absent (no op notes or prior chest imaging available). Small fossa, likely ostium of LAA, without clot but no distal cavity seen beyond this.  RIGHT ATRIUM: No thrombus/mass. Mild to moderately dilated.  AORTIC VALVE:  Trileaflet. No regurgitation. No vegetation.  MITRAL VALVE:    Mechanical bileaflet mitral valve in place. Normal structure and function, normal washing hets  TRICUSPID VALVE: Normal structure. Moderate regurgitation. No vegetation.  PULMONIC VALVE: Grossly normal structure. Trivial regurgitation. No apparent vegetation.  INTERATRIAL SEPTUM: No PFO or ASD seen by color Doppler. Negative agitated saline contrast  study  PERICARDIUM: No effusion noted.  DESCENDING AORTA: Moderate to severe diffuse plaque seen   CARDIOVERSION:     Indications:  Symptomatic Atrial Fibrillation  Procedure Details:  Once the TEE was complete, the patient had the defibrillator pads placed in the anterior and posterior position. Once an appropriate level of sedation was confirmed, the patient was cardioverted x 1 with 200J of biphasic synchronized energy.  The patient converted to NSR.  There were no apparent complications.  The patient had normal neuro status and respiratory status post procedure with vitals stable as recorded elsewhere.  Adequate airway was maintained throughout and vital signs monitored per protocol.  Shelda Bruckner, MD, PhD, [facility]  [facility]  [place]  [facility] at [facility] at [facility] [address] [place], [place] [address] [phone]   10:09 AM

## 2024-02-18 NOTE — Transfer of Care (Signed)
 Immediate Anesthesia Transfer of Care Note  Patient: Maria Ingram  Procedure(s) Performed: TRANSESOPHAGEAL ECHOCARDIOGRAM CARDIOVERSION  Patient Location: Cath Lab  Anesthesia Type:MAC  Level of Consciousness: drowsy and patient cooperative  Airway & Oxygen Therapy: Patient Spontanous Breathing and Patient connected to nasal cannula oxygen  Post-op Assessment: Report given to RN, Post -op Vital signs reviewed and stable, and Patient moving all extremities X 4  Post vital signs: Reviewed and stable  Last Vitals:  Vitals Value Taken Time  BP 156/65 02/18/24 10:13  Temp 36.4 C 02/18/24 10:13  Pulse 80 02/18/24 10:14  Resp 18 02/18/24 10:14  SpO2 93 % 02/18/24 10:14  Vitals shown include unfiled device data.  Last Pain:  Vitals:   02/18/24 1013  TempSrc: Tympanic  PainSc: 0-No pain         Complications: No notable events documented.

## 2024-02-18 NOTE — Interval H&P Note (Signed)
 History and Physical Interval Note:  02/18/2024 9:02 AM  Maria Ingram  has presented today for surgery, with the diagnosis of AFIB.  The various methods of treatment have been discussed with the patient and family. After consideration of risks, benefits and other options for treatment, the patient has consented to  Procedure(s): TRANSESOPHAGEAL ECHOCARDIOGRAM (N/A) CARDIOVERSION (N/A) as a surgical intervention.  The patient's history has been reviewed, patient examined, no change in status, stable for surgery.  I have reviewed the patient's chart and labs.  Questions were answered to the patient's satisfaction.     Kemesha Mosey Lonni

## 2024-02-18 NOTE — Discharge Instructions (Signed)
TEE  YOU HAD AN CARDIAC PROCEDURE TODAY: Refer to the procedure report and other information in the discharge instructions given to you for any specific questions about what was found during the examination. If this information does not answer your questions, please call [facility] office at [phone] to clarify.   DIET: Your first meal following the procedure should be a light meal and then it is ok to progress to your normal diet. A half-sandwich or bowl of soup is an example of a good first meal. Heavy or fried foods are harder to digest and may make you feel nauseous or bloated. Drink plenty of fluids but you should avoid alcoholic beverages for 24 hours. If you had a esophageal dilation, please see attached instructions for diet.   ACTIVITY: Your care partner should take you home directly after the procedure. You should plan to take it easy, moving slowly for the rest of the day. You can resume normal activity the day after the procedure however YOU SHOULD NOT DRIVE, use power tools, machinery or perform tasks that involve climbing or major physical exertion for 24 hours (because of the sedation medicines used during the test).   SYMPTOMS TO REPORT IMMEDIATELY: A cardiologist can be reached at any hour. Please call [phone] for any of the following symptoms:  Vomiting of blood or coffee ground material  New, significant abdominal pain  New, significant chest pain or pain under the shoulder blades  Painful or persistently difficult swallowing  New shortness of breath  Black, tarry-looking or red, bloody stools  FOLLOW UP:  Please also call with any specific questions about appointments or follow up tests. Electrical Cardioversion Electrical cardioversion is the delivery of a jolt of electricity to restore a normal rhythm to the heart. A rhythm that is too fast or is not regular keeps the heart from pumping well. In this procedure, sticky patches or metal paddles are placed on the  chest to deliver electricity to the heart from a device. This procedure may be done in an emergency if: There is low or no blood pressure as a result of the heart rhythm. Normal rhythm must be restored as fast as possible to protect the brain and heart from further damage. It may save a life. This may also be a scheduled procedure for irregular or fast heart rhythms that are not immediately life-threatening.  What can I expect after the procedure? Your blood pressure, heart rate, breathing rate, and blood oxygen level will be monitored until you leave the hospital or clinic. Your heart rhythm will be watched to make sure it does not change. You may have some redness on the skin where the shocks were given. Over the counter cortizone cream may be helpful.  Follow these instructions at home: Do not drive for 24 hours if you were given a sedative during your procedure. Take over-the-counter and prescription medicines only as told by your health care provider. Ask your health care provider how to check your pulse. Check it often. Rest for 48 hours after the procedure or as told by your health care provider. Avoid or limit your caffeine use as told by your health care provider. Keep all follow-up visits as told by your health care provider. This is important. Contact a health care provider if: You feel like your heart is beating too quickly or your pulse is not regular. You have a serious muscle cramp that does not go away. Get help right away if: You have discomfort in your   chest. You are dizzy or you feel faint. You have trouble breathing or you are short of breath. Your speech is slurred. You have trouble moving an arm or leg on one side of your body. Your fingers or toes turn cold or blue. Summary Electrical cardioversion is the delivery of a jolt of electricity to restore a normal rhythm to the heart. This procedure may be done right away in an emergency or may be a scheduled procedure  if the condition is not an emergency. Generally, this is a safe procedure. After the procedure, check your pulse often as told by your health care provider. This information is not intended to replace advice given to you by your health care provider. Make sure you discuss any questions you have with your health care provider. Document Revised: 02/21/2019 Document Reviewed: 02/21/2019 Elsevier Patient Education  2020 [employer] 

## 2024-02-20 NOTE — Anesthesia Postprocedure Evaluation (Signed)
 Anesthesia Post Note  Patient: Maria Ingram  Procedure(s) Performed: TRANSESOPHAGEAL ECHOCARDIOGRAM CARDIOVERSION     Patient location during evaluation: Cath Lab Anesthesia Type: General Level of consciousness: awake and alert Pain management: pain level controlled Vital Signs Assessment: post-procedure vital signs reviewed and stable Respiratory status: spontaneous breathing, nonlabored ventilation, respiratory function stable and patient connected to nasal cannula oxygen Cardiovascular status: blood pressure returned to baseline and stable Postop Assessment: no apparent nausea or vomiting Anesthetic complications: no   No notable events documented.  Last Vitals:  Vitals:   02/18/24 1023 02/18/24 1033  BP: 120/62 (!) 149/53  Pulse: 62 72  Resp: 12 17  Temp:    SpO2: 97% 95%    Last Pain:  Vitals:   02/18/24 1033  TempSrc:   PainSc: 0-No pain   Pain Goal:                   Garnette FORBES Skillern

## 2024-02-22 ENCOUNTER — Telehealth: Payer: Self-pay | Admitting: Cardiology

## 2024-02-22 NOTE — Telephone Encounter (Signed)
 Spoke with Jacqueline, notified of the following per Dr. MARLA and verbalized understanding.

## 2024-02-22 NOTE — Telephone Encounter (Signed)
 Spoke with Will, states patient has been having chest tightness over the weekend with secondary symptoms of weakness dizziness and rapid HR and felt chills after the procedure. Patient stated rapid HR felt different this time. Per Dr. Krasowski, patient need to go to the emergency room to be evaluated. I tried calling both number on file without any response. I LM to return my call x3

## 2024-02-22 NOTE — Telephone Encounter (Signed)
 LM to return my call.

## 2024-02-22 NOTE — Telephone Encounter (Signed)
 Pt c/o of Chest Pain: STAT if active (IN THIS MOMENT) CP, including tightness, pressure, jaw pain, shoulder/upper arm/back pain, SOB, nausea, and vomiting.  1. Are you having CP right now (tightness, pressure, or discomfort)? No  2. Are you experiencing any other symptoms (ex. SOB, nausea, vomiting, sweating)? no  3. How long have you been experiencing CP? Since Thursday   4. Is your CP continuous or coming and going? Coming and going   5. Have you taken Nitroglycerin? No   6. If CP returns before callback, please consider calling 911. ?   Pt daughter called in stating pt still doesn't feel well after cardioversion/TEE. Please advise,

## 2024-02-22 NOTE — Telephone Encounter (Signed)
 Pt's granddaughter returning nurse call

## 2024-02-25 ENCOUNTER — Other Ambulatory Visit: Payer: Self-pay

## 2024-02-25 DIAGNOSIS — R058 Other specified cough: Secondary | ICD-10-CM

## 2024-02-25 MED ORDER — BUDESONIDE 0.5 MG/2ML IN SUSP: 0.5000 mg | Freq: Two times a day (BID) | RESPIRATORY_TRACT | 2 refills | Status: DC | PRN

## 2024-02-26 ENCOUNTER — Telehealth: Payer: Self-pay

## 2024-02-26 DIAGNOSIS — R058 Other specified cough: Secondary | ICD-10-CM

## 2024-02-26 NOTE — Telephone Encounter (Signed)
 Pharmacy Patient Advocate Encounter   Received notification from CoverMyMeds that prior authorization for Budesonide  0.5MG /2ML suspension  is required/requested.   Insurance verification completed.   The patient is insured through [employer] .   Per test claim: Medication is not eligible for pharmacy benefits and must be billed through medical insurance. As our team only handles pharmacy related prior auths, medical PA's must be submitted by the clinic. Thank you

## 2024-02-27 ENCOUNTER — Emergency Department (HOSPITAL_COMMUNITY)
Admission: EM | Admit: 2024-02-27 | Discharge: 2024-02-27 | Disposition: A | Discharge status: Home / Self Care | Attending: Emergency Medicine | Admitting: Emergency Medicine

## 2024-02-27 ENCOUNTER — Encounter (HOSPITAL_COMMUNITY): Payer: Self-pay

## 2024-02-27 ENCOUNTER — Emergency Department (HOSPITAL_COMMUNITY)

## 2024-02-27 ENCOUNTER — Other Ambulatory Visit: Payer: Self-pay

## 2024-02-27 DIAGNOSIS — I809 Phlebitis and thrombophlebitis of unspecified site: Secondary | ICD-10-CM

## 2024-02-27 DIAGNOSIS — Z7901 Long term (current) use of anticoagulants: Secondary | ICD-10-CM | POA: Diagnosis not present

## 2024-02-27 DIAGNOSIS — M79605 Pain in left leg: Secondary | ICD-10-CM | POA: Diagnosis not present

## 2024-02-27 DIAGNOSIS — I8002 Phlebitis and thrombophlebitis of superficial vessels of left lower extremity: Secondary | ICD-10-CM | POA: Diagnosis not present

## 2024-02-27 DIAGNOSIS — Z79899 Other long term (current) drug therapy: Secondary | ICD-10-CM | POA: Diagnosis not present

## 2024-02-27 DIAGNOSIS — R2242 Localized swelling, mass and lump, left lower limb: Secondary | ICD-10-CM | POA: Diagnosis present

## 2024-02-27 DIAGNOSIS — I10 Essential (primary) hypertension: Secondary | ICD-10-CM | POA: Insufficient documentation

## 2024-02-27 DIAGNOSIS — E1165 Type 2 diabetes mellitus with hyperglycemia: Secondary | ICD-10-CM | POA: Diagnosis not present

## 2024-02-27 LAB — CBC
HCT: 37.7 % (ref 36.0–46.0)
Hemoglobin: 12.4 g/dL (ref 12.0–15.0)
MCH: 29.9 pg (ref 26.0–34.0)
MCHC: 32.9 g/dL (ref 30.0–36.0)
MCV: 90.8 fL (ref 80.0–100.0)
Platelets: 235 K/uL (ref 150–400)
RBC: 4.15 MIL/uL (ref 3.87–5.11)
RDW: 13.3 % (ref 11.5–15.5)
WBC: 6.5 K/uL (ref 4.0–10.5)
nRBC: 0 % (ref 0.0–0.2)

## 2024-02-27 LAB — BASIC METABOLIC PANEL WITH GFR
Anion gap: 9 (ref 5–15)
BUN: 17 mg/dL (ref 8–23)
CO2: 25 mmol/L (ref 22–32)
Calcium: 8.9 mg/dL (ref 8.9–10.3)
Chloride: 102 mmol/L (ref 98–111)
Creatinine, Ser: 0.53 mg/dL (ref 0.44–1.00)
GFR, Estimated: >60 mL/min (ref >60)
Glucose, Bld: 186 mg/dL — ABNORMAL HIGH (ref 70–99)
Potassium: 4 mmol/L (ref 3.5–5.1)
Sodium: 136 mmol/L (ref 135–145)

## 2024-02-27 LAB — TROPONIN I (HIGH SENSITIVITY): Troponin I (High Sensitivity): 8 ng/L (ref <18)

## 2024-02-27 NOTE — Progress Notes (Signed)
 VASCULAR LAB    Left lower extremity venous duplex has been performed.  See CV proc for preliminary results.  Gave verbal report to Ileana Eck, PA-C  Laisha Rau, RVT 02/27/2024, 3:01 PM

## 2024-02-27 NOTE — ED Notes (Signed)
 Patient ambulatory to RR; assisted by family.

## 2024-02-27 NOTE — Discharge Instructions (Signed)
 Today you were seen for left leg pain.  I suspect you likely have phlebitis which is inflammation of a vein.  You may use warm compresses and compression socks to help with your symptoms.  Please follow-up with your PCP in the upcoming days if your symptoms persist for further evaluation and workup.  Thank you for letting us  treat you today. After reviewing your labs and imaging, I feel you are safe to go home. Please follow up with your PCP in the next several days and provide them with your records from this visit. Return to the Emergency Room if pain becomes severe or symptoms worsen.

## 2024-02-27 NOTE — ED Triage Notes (Signed)
 Spanish interpreter used for triage:  Pt had a cardioversion on 7/17 for a fib, after she was discharged she felt sob and had chest discomfort but after a few days it got better. Pt states she woke up today with pain behind her left leg with a hardened area with redness, pt concerned for blood clot. Pt takes coumadin.

## 2024-02-27 NOTE — ED Triage Notes (Signed)
 Pt to ED c/o left leg problem. Pt reports swelling and redness in back of left leg x 1 day. No known injury.

## 2024-02-27 NOTE — ED Provider Notes (Signed)
 [place] [facility] Provider Note   CSN: [id number] Arrival date & time: 02/27/24  1130     Patient presents with: Leg Pain, Chest Pain, and Shortness of Breath   Maria Ingram is a 73 y.o. female past medical history significant for A-fib, hypertension, diabetes, and left knee pain presents today for pain and swelling behind her left leg.  Patient also reports erythema.  Patient reports concern for blood clot, patient is anticoagulated on Coumadin.  Patient was recently cardioverted on 7/17 for A-fib.  Patient initially felt short of breath and had chest discomfort after cardioversion but states that it has resolved since.  HPI gathered using certified language interpreter.     Leg Pain Chest Pain Shortness of Breath      Prior to Admission medications   Medication Sig Start Date End Date Taking? Authorizing Provider  atorvastatin (LIPITOR) 10 MG tablet Take 10 mg by mouth daily. 07/31/22   [provider]  budesonide  (PULMICORT ) 0.5 MG/2ML nebulizer solution Take 2 mLs (0.5 mg total) by nebulization 2 (two) times daily as needed (Cold). 02/25/24   Kozlow, Camellia PARAS, MD  citalopram  (CELEXA ) 10 MG tablet Take 10 mg by mouth daily. 09/19/21   [provider]  colestipol  (COLESTID ) 1 g tablet TAKE ONE TABLET BY MOUTH ONCE DAILY 12/18/23   Kozlow, Camellia PARAS, MD  enoxaparin  (LOVENOX ) 60 MG/0.6ML injection Inject 60 mg into the skin every 3 (three) months.    [provider]  ENSTILAR 0.005-0.064 % FOAM Apply 1 application  topically See admin instructions. Twice weekly of needed Psoriasis 09/19/21   [provider]  famotidine  (PEPCID ) 20 MG tablet Take 1 tablet (20 mg total) by mouth at bedtime. Please call [phone] to schedule an office visit for more refills 12/23/23   Charlanne Groom, MD  ipratropium-albuterol  (DUONEB) 0.5-2.5 (3) MG/3ML SOLN Take 3 mLs by nebulization every 6 (six) hours as needed. 08/31/23    Kozlow, Camellia PARAS, MD  losartan  (COZAAR ) 50 MG tablet TAKE ONE TABLET BY MOUTH EVERY DAY 02/11/23   Kozlow, Camellia PARAS, MD  memantine  (NAMENDA ) 10 MG tablet Take 1 tablet (10 mg total) by mouth 2 (two) times daily. APPOINTMENT NEEDED FOR FURTHER REFILLS Patient taking differently: Take 5 mg by mouth 2 (two) times daily. APPOINTMENT NEEDED FOR FURTHER REFILLS 08/12/23 02/18/24  Gregg Lek, MD  montelukast  (SINGULAIR ) 10 MG tablet Take 1 tablet (10 mg total) by mouth at bedtime. 10/12/23   Kozlow, Camellia PARAS, MD  SKYRIZI 150 MG/ML SOSY Inject 150 mg as directed every 3 (three) months. 07/15/21   [provider]  warfarin (COUMADIN) 1 MG tablet Take 1 mg by mouth See admin instructions. Take with 3 mg for a total of 4 mg on Mon., Tues., Fri., Sat., and Sun.  at 11:00 am 09/19/21   [provider]  warfarin (COUMADIN) 3 MG tablet Take 3 mg by mouth See admin instructions. Take 3 mg on Wednesday and Thursday at 11:00 qm 10/16/20   [provider]    Allergies: Patient has no known allergies.    Review of Systems  Cardiovascular:  Positive for leg swelling.  Musculoskeletal:  Positive for arthralgias.    Updated Vital Signs BP 115/65   Pulse 69   Temp 98.3 F (36.8 C) (Oral)   Resp 14   SpO2 96%   Physical Exam Vitals and nursing note reviewed.  Constitutional:      General: She is not in acute distress.  Appearance: She is well-developed. She is not ill-appearing.  HENT:     Head: Normocephalic and atraumatic.  Eyes:     Conjunctiva/sclera: Conjunctivae normal.  Cardiovascular:     Rate and Rhythm: Normal rate and regular rhythm.     Heart sounds: No murmur heard. Pulmonary:     Effort: Pulmonary effort is normal. No respiratory distress.     Breath sounds: Wheezing present.     Comments: Mild expiratory wheezing in all lobes Abdominal:     Palpations: Abdomen is soft.     Tenderness: There is no abdominal tenderness.  Musculoskeletal:        General: No  swelling.     Cervical back: Neck supple.     Comments: Lower extremity varicose veins noted on exam.  Patient does have left popliteal fossa tenderness without obvious erythema or warmth.  Patient's left leg is not noticeably swollen compared to right leg.  Patient is neurovascularly intact.  Skin:    General: Skin is warm and dry.     Capillary Refill: Capillary refill takes less than 2 seconds.  Neurological:     General: No focal deficit present.     Mental Status: She is alert and oriented to person, place, and time.  Psychiatric:        Mood and Affect: Mood normal.     (all labs ordered are listed, but only abnormal results are displayed) Labs Reviewed  BASIC METABOLIC PANEL WITH GFR - Abnormal; Notable for the following components:      Result Value   Glucose, Bld 186 (*)    All other components within normal limits  CBC  TROPONIN I (HIGH SENSITIVITY)  TROPONIN I (HIGH SENSITIVITY)    EKG: EKG Interpretation Date/Time:  Saturday February 27 2024 11:53:39 EDT Ventricular Rate:  79 PR Interval:  186 QRS Duration:  84 QT Interval:  370 QTC Calculation: 424 R Axis:   25  Text Interpretation: Normal sinus rhythm Nonspecific T wave abnormality Abnormal ECG When compared with ECG of 18-Feb-2024 10:38, PREVIOUS ECG IS PRESENT No acute changes Confirmed by Charlyn Sora ([id number]) on 02/27/2024 2:32:12 PM  Radiology: DG Chest 2 View Result Date: 02/27/2024 CLINICAL DATA:  Chest pain and shortness of breath for 1 week. EXAM: CHEST - 2 VIEW COMPARISON:  None Available. FINDINGS: Mild cardiomegaly. Prosthetic mitral valve noted. Mild left lung scarring is seen. No evidence of pulmonary infiltrate or pleural effusion. IMPRESSION: Mild cardiomegaly and left lung scarring. No active lung disease. Electronically Signed   By: Norleen DELENA Kil M.D.   On: 02/27/2024 12:21     Procedures   Medications Ordered in the ED - No data to display                                  Medical Decision  Making Amount and/or Complexity of Data Reviewed Labs: ordered. Radiology: ordered.   This patient presents to the ED for concern of left knee pain and swelling, this involves an extensive number of treatment options, and is a complaint that carries with it a high risk of complications and morbidity.  The differential diagnosis includes DVT, cellulitis, abscess, cyst   Co morbidities / Chronic conditions that complicate the patient evaluation  Hypertension, diabetes, A-fib   Additional history obtained:  Additional history obtained from EMR External records from outside source obtained and reviewed including previous admission documents   Lab Tests:  I Ordered,  and personally interpreted labs.  The pertinent results include: CBC WNL, BMP with hyperglycemia 186, troponin 8   Imaging Studies ordered:  I ordered imaging studies including chest x-ray I independently visualized and interpreted imaging which showed mild cardiomegaly and left lung scarring.  No active lung disease I agree with the radiologist interpretation DVT study of the left lower extremity which showed phlebitis of the small saphenous vein   Cardiac Monitoring: / EKG:  The patient was maintained on a cardiac monitor.  I personally viewed and interpreted the cardiac monitored which showed an underlying rhythm of: Normal sinus rhythm, nonspecific T wave abnormality  Test / Admission - Considered:  Considered for admission or further workup however patient's vital signs, physical exam, labs, and imaging are reassuring.  Patient symptoms likely due to phlebitis.  Patient advised to use warm compresses and compression socks.  Patient to follow-up with her PCP if her symptoms persist for further evaluation workup.  Patient given return precautions.  I feel patient is safe for discharge at this time.     Final diagnoses:  Phlebitis    ED Discharge Orders     None          Maria Ingram 02/27/24  1506    Charlyn Sora, MD 02/28/24 [phone]

## 2024-02-27 NOTE — ED Notes (Signed)
 Pt placed on monitor, warm blanket provided,  call bell in reach, family member at bedside. No needs requested at this time.

## 2024-03-09 MED ORDER — BUDESONIDE 0.5 MG/2ML IN SUSP: 0.5000 mg | Freq: Two times a day (BID) | RESPIRATORY_TRACT | 2 refills | Status: DC | PRN

## 2024-03-09 NOTE — Addendum Note (Signed)
 Addended by: ONEITA CHRISTIANS D on: 03/09/2024 10:02 AM   Modules accepted: Orders

## 2024-03-12 IMAGING — DX DG ABDOMEN 2V
2 series · 2 of 2 positions shown · non-contrast
Comparison: CT 11/03/2019

CLINICAL DATA: Generalized abdominal pain. Gas, bloating, nausea,
vomiting. Symptoms are intermittent for 3 years.

EXAM:
ABDOMEN - 2 VIEW

[abdomen erect]
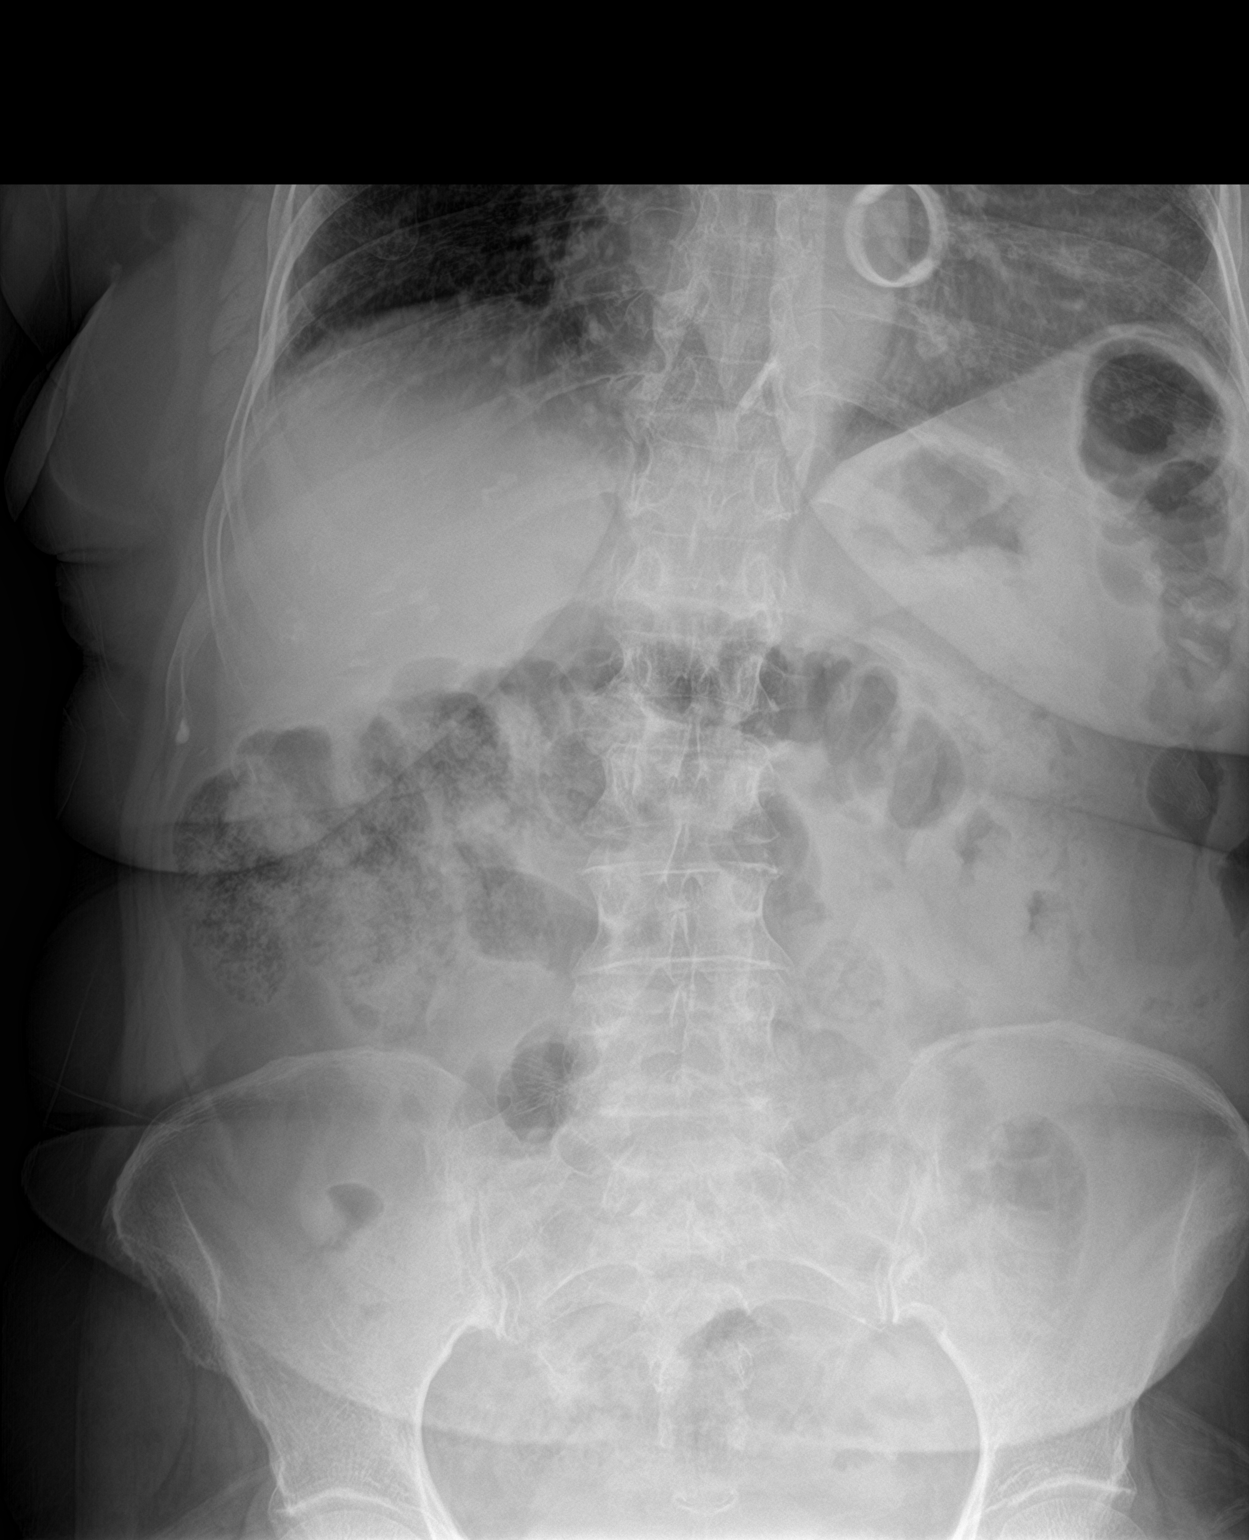

[abdomen supine]
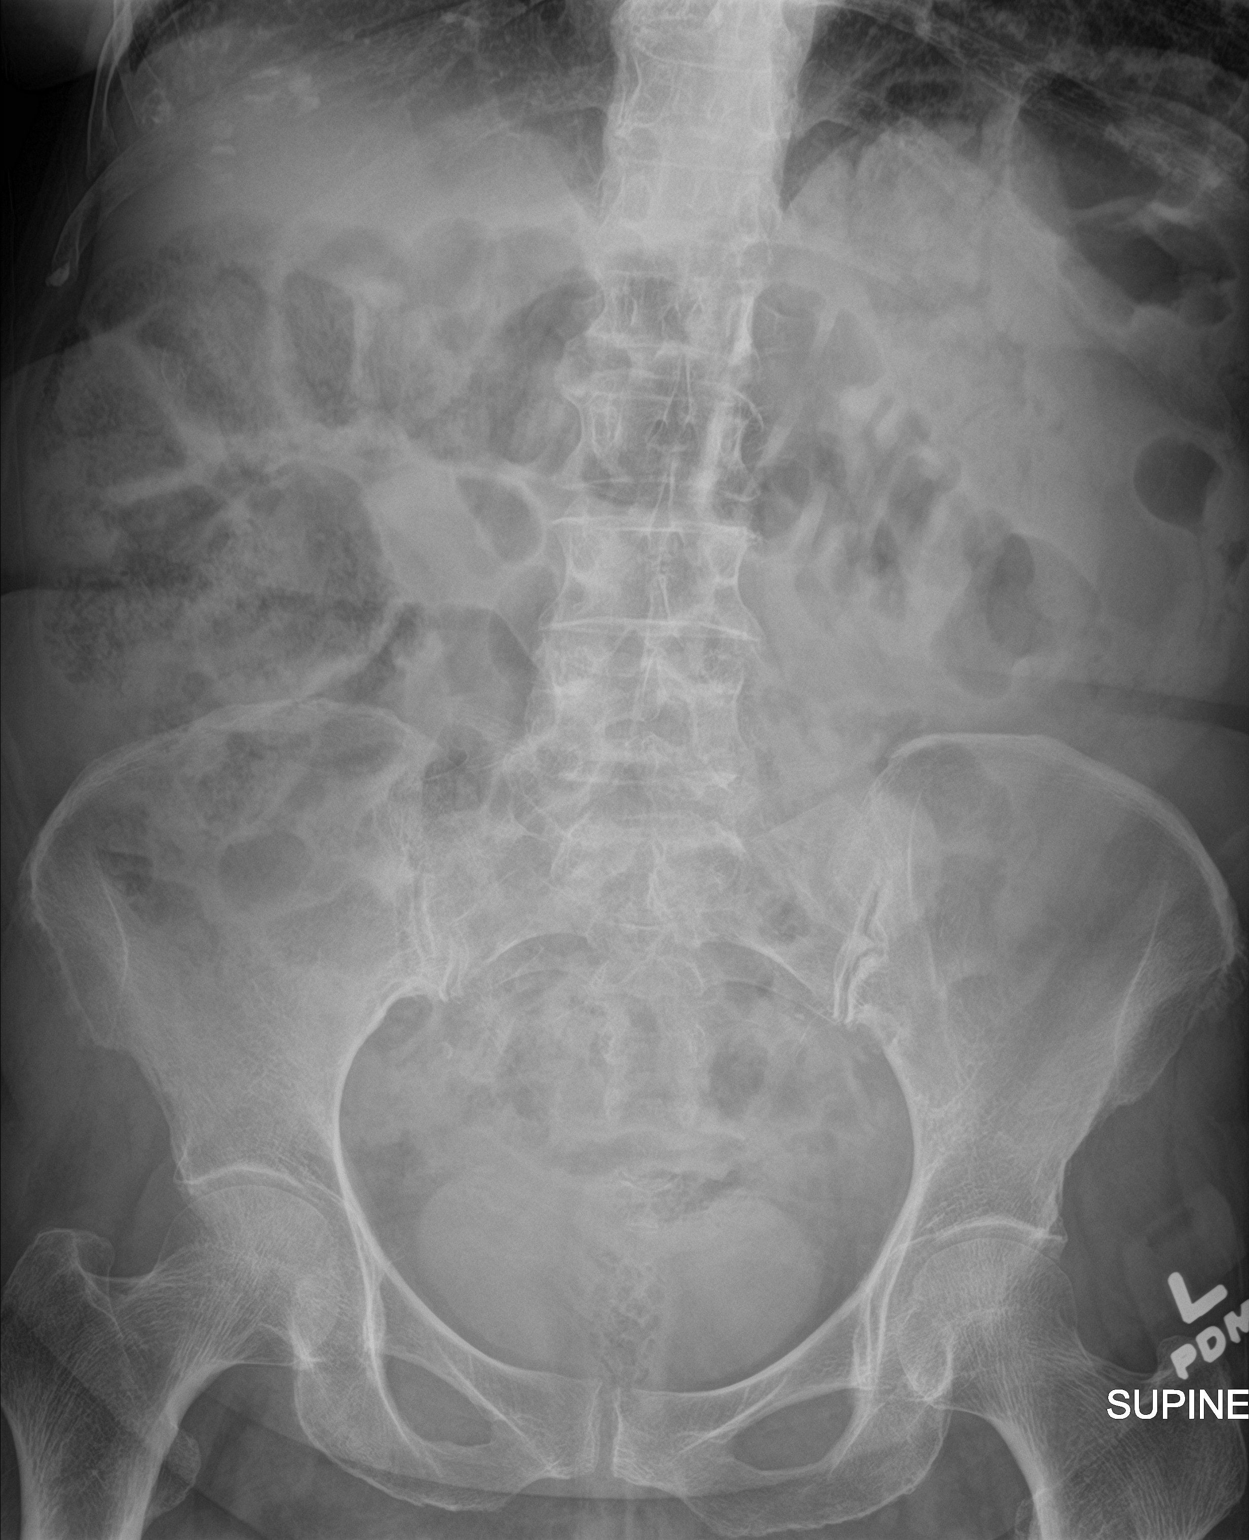

[2 of 2 positions shown; findings below may reference images not displayed]

FINDINGS: Bowel gas pattern is nonobstructed. Moderate stool burden. No
evidence for organomegaly. No free intraperitoneal air. Visualized
osseous structures have a normal appearance. Prior mitral annulus
repair.
IMPRESSION: Moderate stool burden.  No evidence for acute  abnormality.

## 2024-03-25 ENCOUNTER — Telehealth: Payer: Self-pay

## 2024-03-25 NOTE — Telephone Encounter (Signed)
 Pharmacy Patient Advocate Encounter   Received notification from CoverMyMeds that prior authorization for Budesonide  0.5MG /2ML suspension  is required/requested.   Insurance verification completed.   The patient is insured through [employer] .   Per test claim: Medication is not eligible for pharmacy benefits and must be billed through medical insurance. As our team only handles pharmacy related prior auths, medical PA's must be submitted by the clinic. Thank you

## 2024-04-25 DIAGNOSIS — J302 Other seasonal allergic rhinitis: Secondary | ICD-10-CM | POA: Insufficient documentation

## 2024-04-25 DIAGNOSIS — H269 Unspecified cataract: Secondary | ICD-10-CM | POA: Insufficient documentation

## 2024-04-25 DIAGNOSIS — Z8619 Personal history of other infectious and parasitic diseases: Secondary | ICD-10-CM | POA: Insufficient documentation

## 2024-04-25 DIAGNOSIS — E119 Type 2 diabetes mellitus without complications: Secondary | ICD-10-CM | POA: Insufficient documentation

## 2024-04-27 ENCOUNTER — Encounter: Payer: Self-pay | Admitting: Cardiology

## 2024-04-27 ENCOUNTER — Ambulatory Visit: Attending: Cardiology | Admitting: Cardiology

## 2024-04-27 VITALS — BP 132/78 | HR 74 | Ht 62.6 in | Wt 148.0 lb

## 2024-04-27 DIAGNOSIS — I4819 Other persistent atrial fibrillation: Secondary | ICD-10-CM | POA: Diagnosis present

## 2024-04-27 DIAGNOSIS — I10 Essential (primary) hypertension: Secondary | ICD-10-CM | POA: Insufficient documentation

## 2024-04-27 DIAGNOSIS — E119 Type 2 diabetes mellitus without complications: Secondary | ICD-10-CM | POA: Diagnosis present

## 2024-04-27 DIAGNOSIS — E785 Hyperlipidemia, unspecified: Secondary | ICD-10-CM | POA: Diagnosis present

## 2024-04-27 NOTE — Patient Instructions (Addendum)
 Medication Instructions:  Your physician recommends that you continue on your current medications as directed. Please refer to the Current Medication list given to you today.  *If you need a refill on your cardiac medications before your next appointment, please call your pharmacy*   Lab Work: None Ordered If you have labs (blood work) drawn today and your tests are completely normal, you will receive your results only by: MyChart Message (if you have MyChart) OR A paper copy in the mail If you have any lab test that is abnormal or we need to change your treatment, we will call you to review the results.   Testing/Procedures: None Ordered   Follow-Up: At [facility], you and your health needs are our priority.  As part of our continuing mission to provide you with exceptional heart care, we have created designated Provider Care Teams.  These Care Teams include your primary Cardiologist (physician) and Advanced Practice Providers (APPs -  Physician Assistants and Nurse Practitioners) who all work together to provide you with the care you need, when you need it.  We recommend signing up for the patient portal called "MyChart".  Sign up information is provided on this After Visit Summary.  MyChart is used to connect with patients for Virtual Visits (Telemedicine).  Patients are able to view lab/test results, encounter notes, upcoming appointments, etc.  Non-urgent messages can be sent to your provider as well.   To learn more about what you can do with MyChart, go to [email or web address].    Your next appointment:   3 month(s)  The format for your next appointment:   In Person  Provider:   Gypsy Balsam, MD    Other Instructions Appt with Dr. Elberta Fortis

## 2024-04-27 NOTE — Addendum Note (Signed)
 Addended by: ARLOA PLANAS D on: 04/27/2024 03:09 PM   Modules accepted: Orders

## 2024-04-27 NOTE — Progress Notes (Signed)
 Cardiology Office Note:    Date:  04/27/2024   ID:  Maria Ingram, DOB 04/28/1951, MRN [id number]  PCP:  Trinidad Glisson, MD  Cardiologist:  Lamar Fitch, MD    Referring MD: Trinidad Glisson, MD   No chief complaint on file. Doing fine  History of Present Illness:    Maria Ingram is a 73 y.o. female past medical history significant for rheumatic heart disease status post mitral valve replacement done in [place] in 2008 Lukacs and Jude valve, size unknown, additional problem include essential hypertension, dyslipidemia, prediabetes.  Comes today to my office for follow-up.  Last time I seen her she was in atrial fibrillation, since that time we did transesophageal echocardiogram converted her to sinus rhythm.  She is here in my office doing well feeling better with normal rhythm.  She did also have a visit in the emergency room because of swelling of left calf ultrasounds was negative for DVT.  Past Medical History:  Diagnosis Date   Abnormal stress test 02/11/2018   Mid and apical portion of the anterior wall ischemia and stress test in July 2019 Cardiac catheterization after that showing normal coronaries   Adenomatous polyp of descending colon    Anemia 11/10/2019   Blood transfusion without reported diagnosis 1981   post delivery of child   Borderline diabetes 02/08/2018   Cataract    bilateral sx   Diabetes mellitus without complication (HCC)    diet controlled- no meds   Dyslipidemia 02/08/2018   Essential hypertension 02/08/2018   GERD (gastroesophageal reflux disease)    H/O mitral valve replacement with mechanical valve 01/08/2015   Done in 2000, st jude  Formatting of this note might be different from the original. Done in 2000, st jude   History of mitral valve prosthesis 02/08/2018   Done many years ago in [place]   History of rheumatic heart disease    History of strongyloidiasis    Hyperlipidemia    on meds   Hypertension    on meds    Osteoarthritis of left knee 09/13/2018   bilateral hands/legs   Pain in left knee 05/20/2018   Persistent atrial fibrillation (HCC) 01/06/2024   Positive colorectal cancer screening using Cologuard test    Pre-diabetes    Borderline   Pre-operative clearance 02/08/2018   Psoriasis    Seasonal allergies     Past Surgical History:  Procedure Laterality Date   BIOPSY  11/18/2021   Procedure: BIOPSY;  Surgeon: Charlanne Groom, MD;  Location: [facility];  Service: Endoscopy;;  EGD and COLON   BREAST BIOPSY Right 11/25/2017   atrophic breast tissue with stromal fibrosis.   CARDIOVERSION N/A 02/18/2024   Procedure: CARDIOVERSION;  Surgeon: Lonni Slain, MD;  Location: [facility] INVASIVE CV LAB;  Service: Cardiovascular;  Laterality: N/A;   COLONOSCOPY WITH PROPOFOL  N/A 11/18/2021   Procedure: COLONOSCOPY WITH PROPOFOL ;  Surgeon: Charlanne Groom, MD;  Location: [facility];  Service: Endoscopy;  Laterality: N/A;   CORNEA LACERATION REPAIR Bilateral    ESOPHAGOGASTRODUODENOSCOPY (EGD) WITH PROPOFOL  N/A 11/18/2021   Procedure: ESOPHAGOGASTRODUODENOSCOPY (EGD) WITH PROPOFOL ;  Surgeon: Charlanne Groom, MD;  Location: [facility];  Service: Endoscopy;  Laterality: N/A;   KNEE ARTHROSCOPY W/ MENISCAL REPAIR Right 2018   LEFT HEART CATH AND CORONARY ANGIOGRAPHY N/A 02/16/2018   Procedure: LEFT HEART CATH AND CORONARY ANGIOGRAPHY;  Surgeon: Swaziland, Peter M, MD;  Location: [facility] INVASIVE CV LAB;  Service: Cardiovascular;  Laterality: N/A;   POLYPECTOMY  11/18/2021   Procedure: POLYPECTOMY;  Surgeon:  Charlanne Groom, MD;  Location: [facility];  Service: Endoscopy;;   SUBMUCOSAL TATTOO INJECTION  11/18/2021   Procedure: SUBMUCOSAL TATTOO INJECTION;  Surgeon: Charlanne Groom, MD;  Location: [facility];  Service: Endoscopy;;   TRANSESOPHAGEAL ECHOCARDIOGRAM (CATH LAB) N/A 02/18/2024   Procedure: TRANSESOPHAGEAL ECHOCARDIOGRAM;  Surgeon: Lonni Slain, MD;  Location: [facility] INVASIVE CV LAB;  Service:  Cardiovascular;  Laterality: N/A;   VALVE REPLACEMENT     St Jude mitral valve prosthesis    Current Medications: Current Meds  Medication Sig   atorvastatin (LIPITOR) 10 MG tablet Take 10 mg by mouth daily.   budesonide  (PULMICORT ) 0.5 MG/2ML nebulizer solution Take 2 mLs (0.5 mg total) by nebulization 2 (two) times daily as needed (Cold).   citalopram  (CELEXA ) 10 MG tablet Take 10 mg by mouth daily.   colestipol  (COLESTID ) 1 g tablet TAKE ONE TABLET BY MOUTH ONCE DAILY   enoxaparin  (LOVENOX ) 60 MG/0.6ML injection Inject 60 mg into the skin every 3 (three) months.   ENSTILAR 0.005-0.064 % FOAM Apply 1 application  topically See admin instructions. Twice weekly of needed Psoriasis   famotidine  (PEPCID ) 20 MG tablet Take 1 tablet (20 mg total) by mouth at bedtime. Please call [phone] to schedule an office visit for more refills   ipratropium-albuterol  (DUONEB) 0.5-2.5 (3) MG/3ML SOLN Take 3 mLs by nebulization every 6 (six) hours as needed.   losartan  (COZAAR ) 50 MG tablet TAKE ONE TABLET BY MOUTH EVERY DAY   memantine  (NAMENDA ) 5 MG tablet Take 5 mg by mouth 2 (two) times daily.   montelukast  (SINGULAIR ) 10 MG tablet Take 1 tablet (10 mg total) by mouth at bedtime.   warfarin (COUMADIN) 3 MG tablet Take 3 mg by mouth See admin instructions. Everyday except Wednesday and Sunday   warfarin (COUMADIN) 4 MG tablet Take 4 mg by mouth as directed. On Wednesday and Sunday     Allergies:   Patient has no known allergies.   Social History   Socioeconomic History   Marital status: Married    Spouse name: Not on file   Number of children: Not on file   Years of education: Not on file   Highest education level: Not on file  Occupational History   Not on file  Tobacco Use   Smoking status: Never    Passive exposure: Never   Smokeless tobacco: Never  Vaping Use   Vaping status: Never Used  Substance and Sexual Activity   Alcohol use: Not Currently   Drug use: Never   Sexual  activity: Not on file  Other Topics Concern   Not on file  Social History Narrative   Not on file   Social Drivers of Health   Financial Resource Strain: Not on file  Food Insecurity: Not on file  Transportation Needs: Not on file  Physical Activity: Not on file  Stress: Not on file  Social Connections: Not on file     Family History: The patient's family history includes Asthma in her daughter, father, and son; Colon cancer (age of onset: 87) in her father; Colon polyps (age of onset: 31) in her father; Emphysema in her mother and sister. There is no history of Esophageal cancer, Rectal cancer, Stomach cancer, Immunodeficiency, Eczema, Atopy, or Angioedema. ROS:   Please see the history of present illness.    All 14 point review of systems negative except as described per history of present illness  EKGs/Labs/Other Studies Reviewed:         Recent Labs: 01/06/2024: NT-Pro  BNP 577 02/27/2024: BUN 17; Creatinine, Ser 0.53; Hemoglobin 12.4; Platelets 235; Potassium 4.0; Sodium 136  Recent Lipid Panel No results found for: CHOL, TRIG, HDL, CHOLHDL, VLDL, LDLCALC, LDLDIRECT  Physical Exam:    VS:  BP 132/78   Pulse 74   Ht 5' 2.6 (1.59 m)   Wt 148 lb (67.1 kg)   SpO2 94%   BMI 26.55 kg/m     Wt Readings from Last 3 Encounters:  04/27/24 148 lb (67.1 kg)  02/18/24 145 lb (65.8 kg)  02/12/24 144 lb 6.4 oz (65.5 kg)     GEN:  Well nourished, well developed in no acute distress HEENT: Normal NECK: No JVD; No carotid bruits LYMPHATICS: No lymphadenopathy CARDIAC: RRR, no murmurs, no rubs, no gallops RESPIRATORY:  Clear to auscultation without rales, wheezing or rhonchi  ABDOMEN: Soft, non-tender, non-distended MUSCULOSKELETAL:  No edema; No deformity  SKIN: Warm and dry LOWER EXTREMITIES: no swelling NEUROLOGIC:  Alert and oriented x 3 PSYCHIATRIC:  Normal affect   ASSESSMENT:    1. Persistent atrial fibrillation (HCC)   2. Essential hypertension    3. Diabetes mellitus without complication (HCC)   4. Dyslipidemia    PLAN:    In order of problems listed above:  Paroxysmal atrial fibrillation seems to maintain sinus rhythm will do EKG to confirm that. Essential hypertension blood pressure well-controlled continue present management. Diabetes mellitus that being followed by internal medicine team. Dyslipidemia I did review KPN which show me LDL 46 HDL 41 this is from 01/25/2024 we will continue present management.   Medication Adjustments/Labs and Tests Ordered: Current medicines are reviewed at length with the patient today.  Concerns regarding medicines are outlined above.  Orders Placed This Encounter  Procedures   EKG 12-Lead   Medication changes: No orders of the defined types were placed in this encounter.   Signed, Lamar DOROTHA Fitch, MD, [facility] 04/27/2024 2:54 PM    [facility]

## 2024-05-02 ENCOUNTER — Ambulatory Visit: Attending: Cardiology | Admitting: Cardiology

## 2024-05-02 ENCOUNTER — Encounter: Payer: Self-pay | Admitting: Cardiology

## 2024-05-02 VITALS — BP 152/80 | HR 76 | Ht 62.6 in | Wt 143.0 lb

## 2024-05-02 DIAGNOSIS — I4819 Other persistent atrial fibrillation: Secondary | ICD-10-CM | POA: Insufficient documentation

## 2024-05-02 DIAGNOSIS — D6869 Other thrombophilia: Secondary | ICD-10-CM | POA: Insufficient documentation

## 2024-05-02 DIAGNOSIS — Z01812 Encounter for preprocedural laboratory examination: Secondary | ICD-10-CM | POA: Diagnosis present

## 2024-05-02 NOTE — Progress Notes (Signed)
  Electrophysiology Office Note:   Date:  05/02/2024  ID:  Maria Ingram, DOB 08-Mar-1951, MRN [id number]  Primary Cardiologist: Lamar Fitch, MD Primary Heart Failure: None Electrophysiologist: None      History of Present Illness:   Maria Ingram is a 73 y.o. female with h/o rheumatic heart disease post MVR in [place] in 2008, hypertension, hyperlipidemia, prediabetes, atrial fibrillation seen today for  for Electrophysiology evaluation of atrial fibrillation at the request of Lamar Fitch.    She had pneumonia recently and had antibiotics and bronchodilators.  She went into atrial fibrillation at this time.  This is her initial episode of atrial fibrillation.  Recent echo showed a low normal ejection fraction and a well-functioning mitral valve replacement.  Her left atrium was found to be moderately enlarged.  She is post cardioversion 02/18/2024.  When she is in atrial fibrillation, she feels weak and fatigued.  She is in atrial fibrillation today.  She finds it difficult to do her daily activities such as chores around the house.  She did not maintain sinus rhythm for very long after her cardioversion.  Review of systems complete and found to be negative unless listed in HPI.   EP Information / Studies Reviewed:    EKG is not ordered today. EKG from 04/27/2024 reviewed which showed atrial fibrillation        Risk Assessment/Calculations:    CHA2DS2-VASc Score = 5   This indicates a 7.2% annual risk of stroke. The patient's score is based upon: CHF History: 0 HTN History: 1 Diabetes History: 1 Stroke History: 0 Vascular Disease History: 0 Age Score: 2 Gender Score: 1            Physical Exam:   VS:  There were no vitals taken for this visit.   Wt Readings from Last 3 Encounters:  04/27/24 148 lb (67.1 kg)  02/18/24 145 lb (65.8 kg)  02/12/24 144 lb 6.4 oz (65.5 kg)     GEN: Well nourished, well developed in no acute distress NECK: No JVD; No  carotid bruits CARDIAC: Irregularly irregular rate and rhythm, no murmurs, rubs, gallops RESPIRATORY:  Clear to auscultation without rales, wheezing or rhonchi  ABDOMEN: Soft, non-tender, non-distended EXTREMITIES:  No edema; No deformity   ASSESSMENT AND PLAN:    1.  Persistent atrial fibrillation: Post recent TEE/cardioversion.  She has unfortunately gone back into atrial fibrillation.  She feels quite poor with weakness, fatigue, shortness of breath.  She would prefer rhythm control and would like to avoid long-term antiarrhythmics.  Due to this, we Maria Ingram plan for ablation.  Risks and benefits have been discussed.  She understands the risks and has agreed to the procedure.  Risk, benefits, and alternatives to EP study and radiofrequency/pulse field ablation for afib were also discussed in detail today. These risks include but are not limited to stroke, bleeding, vascular damage, tamponade, perforation, damage to the esophagus, lungs, and other structures, pulmonary vein stenosis, worsening renal function, and death. The patient understands these risk and wishes to proceed.  We Maria Ingram therefore proceed with catheter ablation at the next available time.  Carto, ICE, anesthesia are requested for the procedure.  This patient Maria Ingram NOT require CT prior to ablation  2.  Secondary hypercoagulable state: On warfarin  3.  History of mitral valve replacement: Post mechanical mitral valve.  On warfarin per primary cardiology.  Follow up with Afib Clinic as usual post procedure  Signed, Junita Kubota Gladis Norton, MD

## 2024-05-02 NOTE — Patient Instructions (Addendum)
 Medication Instructions:  Your physician recommends that you continue on your current medications as directed. Please refer to the Current Medication list given to you today.  *If you need a refill on your cardiac medications before your next appointment, please call your pharmacy*   Lab Work: Pre procedure labs -- we will call you to schedule:  BMP & CBC  If you have a lab test that is abnormal and we need to change your treatment, we will call you to review the results -- otherwise no news is good news.    Testing/Procedures: Your physician has recommended that you have an ablation. Catheter ablation is a medical procedure used to treat some cardiac arrhythmias (irregular heartbeats). During catheter ablation, a long, thin, flexible tube is put into a blood vessel in your groin (upper thigh), or neck. This tube is called an ablation catheter. It is then guided to your heart through the blood vessel. Radio frequency waves destroy small areas of heart tissue where abnormal heartbeats may cause an arrhythmia to start. Please review the information below on ablation and after care.  Your ablation will be scheduled for next year.  The office will call you to schedule this once we have January dates (it may be a month/more before calling)    Follow-Up: At [facility], you and your health needs are our priority.  As part of our continuing mission to provide you with exceptional heart care, we have created designated Provider Care Teams.  These Care Teams include your primary Cardiologist (physician) and Advanced Practice Providers (APPs -  Physician Assistants and Nurse Practitioners) who all work together to provide you with the care you need, when you need it.  Your next appointment:   1 month(s) after your ablation  The format for your next appointment:   In Person  Provider:   AFib clinic   Thank you for choosing [facility]!!   Maeola Domino, RN [phone]    Other  Instructions   Cardiac Ablation Cardiac ablation is a procedure to destroy (ablate) some heart tissue that is sending bad signals. These bad signals cause problems in heart rhythm. The heart has many areas that make these signals. If there are problems in these areas, they can make the heart beat in a way that is not normal. Destroying some tissues can help make the heart rhythm normal. Tell your doctor about: Any allergies you have. All medicines you are taking. These include vitamins, herbs, eye drops, creams, and over-the-counter medicines. Any problems you or family members have had with medicines that make you fall asleep (anesthetics). Any blood disorders you have. Any surgeries you have had. Any medical conditions you have, such as kidney failure. Whether you are pregnant or may be pregnant. What are the risks? This is a safe procedure. But problems may occur, including: Infection. Bruising and bleeding. Bleeding into the chest. Stroke or blood clots. Damage to nearby areas of your body. Allergies to medicines or dyes. The need for a pacemaker if the normal system is damaged. Failure of the procedure to treat the problem. What happens before the procedure? Medicines Ask your doctor about: Changing or stopping your normal medicines. This is important. Taking aspirin  and ibuprofen. Do not take these medicines unless your doctor tells you to take them. Taking other medicines, vitamins, herbs, and supplements. General instructions Follow instructions from your doctor about what you cannot eat or drink. Plan to have someone take you home from the hospital or clinic. If  you will be going home right after the procedure, plan to have someone with you for 24 hours. Ask your doctor what steps will be taken to prevent infection. What happens during the procedure?  An IV tube will be put into one of your veins. You will be given a medicine to help you relax. The skin on your neck or  groin will be numbed. A cut (incision) will be made in your neck or groin. A needle will be put through your cut and into a large vein. A tube (catheter) will be put into the needle. The tube will be moved to your heart. Dye may be put through the tube. This helps your doctor see your heart. Small devices (electrodes) on the tube will send out signals. A type of energy will be used to destroy some heart tissue. The tube will be taken out. Pressure will be held on your cut. This helps stop bleeding. A bandage will be put over your cut. The exact procedure may vary among doctors and hospitals. What happens after the procedure? You will be watched until you leave the hospital or clinic. This includes checking your heart rate, breathing rate, oxygen, and blood pressure. Your cut will be watched for bleeding. You will need to lie still for a few hours. Do not drive for 24 hours or as long as your doctor tells you. Summary Cardiac ablation is a procedure to destroy some heart tissue. This is done to treat heart rhythm problems. Tell your doctor about any medical conditions you may have. Tell him or her about all medicines you are taking to treat them. This is a safe procedure. But problems may occur. These include infection, bruising, bleeding, and damage to nearby areas of your body. Follow what your doctor tells you about food and drink. You may also be told to change or stop some of your medicines. After the procedure, do not drive for 24 hours or as long as your doctor tells you. This information is not intended to replace advice given to you by your health care provider. Make sure you discuss any questions you have with your health care provider. Document Revised: 10/11/2021 Document Reviewed: 06/23/2019 Elsevier Patient Education  2023 Elsevier Inc.   Cardiac Ablation, Care After  This sheet gives you information about how to care for yourself after your procedure. Your health care  provider may also give you more specific instructions. If you have problems or questions, contact your health care provider. What can I expect after the procedure? After the procedure, it is common to have: Bruising around your puncture site. Tenderness around your puncture site. Skipped heartbeats. If you had an atrial fibrillation ablation, you may have atrial fibrillation during the first several months after your procedure.  Tiredness (fatigue).  Follow these instructions at home: Puncture site care  Follow instructions from your health care provider about how to take care of your puncture site. Make sure you: If present, leave stitches (sutures), skin glue, or adhesive strips in place. These skin closures may need to stay in place for up to 2 weeks. If adhesive strip edges start to loosen and curl up, you may trim the loose edges. Do not remove adhesive strips completely unless your health care provider tells you to do that. If a large square bandage is present, this may be removed 24 hours after surgery.  Check your puncture site every day for signs of infection. Check for: Redness, swelling, or pain. Fluid or blood. If  your puncture site starts to bleed, lie down on your back, apply firm pressure to the area, and contact your health care provider. Warmth. Pus or a bad smell. A pea or marble sized lump/knot at the site is normal and can take up to three months to resolve.  Driving Do not drive for at least 4 days after your procedure or however long your health care provider recommends. (Do not resume driving if you have previously been instructed not to drive for other health reasons.) Do not drive or use heavy machinery while taking prescription pain medicine. Activity Avoid activities that take a lot of effort for at least 7 days after your procedure. Do not lift anything that is heavier than 5 lb (4.5 kg) for one week.  No sexual activity for 1 week.  Return to your normal  activities as told by your health care provider. Ask your health care provider what activities are safe for you. General instructions Take over-the-counter and prescription medicines only as told by your health care provider. Do not use any products that contain nicotine or tobacco, such as cigarettes and e-cigarettes. If you need help quitting, ask your health care provider. You may shower after 24 hours, but Do not take baths, swim, or use a hot tub for 1 week.  Do not drink alcohol for 24 hours after your procedure. Keep all follow-up visits as told by your health care provider. This is important. Contact a health care provider if: You have redness, mild swelling, or pain around your puncture site. You have fluid or blood coming from your puncture site that stops after applying firm pressure to the area. Your puncture site feels warm to the touch. You have pus or a bad smell coming from your puncture site. You have a fever. You have chest pain or discomfort that spreads to your neck, jaw, or arm. You have chest pain that is worse with lying on your back or taking a deep breath. You are sweating a lot. You feel nauseous. You have a fast or irregular heartbeat. You have shortness of breath. You are dizzy or light-headed and feel the need to lie down. You have pain or numbness in the arm or leg closest to your puncture site. Get help right away if: Your puncture site suddenly swells. Your puncture site is bleeding and the bleeding does not stop after applying firm pressure to the area. These symptoms may represent a serious problem that is an emergency. Do not wait to see if the symptoms will go away. Get medical help right away. Call your local emergency services (911 in the [place] Do not drive yourself to the hospital. Summary After the procedure, it is normal to have bruising and tenderness at the puncture site in your groin, neck, or forearm. Check your puncture site every day for  signs of infection. Get help right away if your puncture site is bleeding and the bleeding does not stop after applying firm pressure to the area. This is a medical emergency. This information is not intended to replace advice given to you by your health care provider. Make sure you discuss any questions you have with your health care provider.

## 2024-06-16 ENCOUNTER — Other Ambulatory Visit (HOSPITAL_BASED_OUTPATIENT_CLINIC_OR_DEPARTMENT_OTHER): Payer: Self-pay | Admitting: Family Medicine

## 2024-06-16 DIAGNOSIS — Z780 Asymptomatic menopausal state: Secondary | ICD-10-CM

## 2024-06-16 DIAGNOSIS — Z1231 Encounter for screening mammogram for malignant neoplasm of breast: Secondary | ICD-10-CM

## 2024-06-20 MED ORDER — FAMOTIDINE 20 MG PO TABS: 20.0000 mg | ORAL_TABLET | Freq: Every day | ORAL | 0 refills | Status: AC

## 2024-06-20 NOTE — Addendum Note (Signed)
 Addended by: KATHIE BOTTCHER E on: 06/20/2024 04:39 PM   Modules accepted: Orders

## 2024-06-21 ENCOUNTER — Other Ambulatory Visit: Payer: Self-pay | Admitting: *Deleted

## 2024-06-21 MED ORDER — COLESTIPOL HCL 1 G PO TABS: ORAL_TABLET | ORAL | 0 refills | Status: AC

## 2024-06-21 MED ORDER — MONTELUKAST SODIUM 10 MG PO TABS: ORAL_TABLET | ORAL | 0 refills | Status: AC

## 2024-07-12 ENCOUNTER — Encounter (HOSPITAL_BASED_OUTPATIENT_CLINIC_OR_DEPARTMENT_OTHER): Payer: Self-pay

## 2024-07-21 ENCOUNTER — Ambulatory Visit: Admitting: Cardiology

## 2024-07-21 ENCOUNTER — Encounter: Payer: Self-pay | Admitting: Cardiology

## 2024-07-21 VITALS — BP 150/70 | HR 85 | Ht 62.6 in | Wt 148.0 lb

## 2024-07-21 DIAGNOSIS — M79605 Pain in left leg: Secondary | ICD-10-CM | POA: Insufficient documentation

## 2024-07-21 DIAGNOSIS — M79604 Pain in right leg: Secondary | ICD-10-CM | POA: Insufficient documentation

## 2024-07-21 DIAGNOSIS — I10 Essential (primary) hypertension: Secondary | ICD-10-CM | POA: Diagnosis not present

## 2024-07-21 DIAGNOSIS — K219 Gastro-esophageal reflux disease without esophagitis: Secondary | ICD-10-CM | POA: Diagnosis not present

## 2024-07-21 DIAGNOSIS — Z952 Presence of prosthetic heart valve: Secondary | ICD-10-CM | POA: Diagnosis present

## 2024-07-21 DIAGNOSIS — I4819 Other persistent atrial fibrillation: Secondary | ICD-10-CM | POA: Diagnosis present

## 2024-07-21 DIAGNOSIS — E782 Mixed hyperlipidemia: Secondary | ICD-10-CM | POA: Insufficient documentation

## 2024-07-21 NOTE — Patient Instructions (Addendum)
 Medication Instructions:  Your physician recommends that you continue on your current medications as directed. Please refer to the Current Medication list given to you today.  *If you need a refill on your cardiac medications before your next appointment, please call your pharmacy*   Lab Work: None Ordered If you have labs (blood work) drawn today and your tests are completely normal, you will receive your results only by: MyChart Message (if you have MyChart) OR A paper copy in the mail If you have any lab test that is abnormal or we need to change your treatment, we will call you to review the results.   Testing/Procedures: Your physician has requested that you have a lower or upper extremity venous duplex. This test is an ultrasound of the veins in the legs or arms. It looks at venous blood flow that carries blood from the heart to the legs or arms. Allow one hour for a Lower Venous exam. Allow thirty minutes for an Upper Venous exam. There are no restrictions or special instructions.  Please note: We ask at that you not bring children with you during ultrasound (echo/ vascular) testing. Due to room size and safety concerns, children are not allowed in the ultrasound rooms during exams. Our front office staff cannot provide observation of children in our lobby area while testing is being conducted. An adult accompanying a patient to their appointment will only be allowed in the ultrasound room at the discretion of the ultrasound technician under special circumstances. We apologize for any inconvenience.   Your physician has requested that you have an ankle brachial index (ABI). During this test an ultrasound and blood pressure cuff are used to evaluate the arteries that supply the arms and legs with blood. Allow thirty minutes for this exam. There are no restrictions or special instructions.  Please note: We ask at that you not bring children with you during ultrasound (echo/ vascular)  testing. Due to room size and safety concerns, children are not allowed in the ultrasound rooms during exams. Our front office staff cannot provide observation of children in our lobby area while testing is being conducted. An adult accompanying a patient to their appointment will only be allowed in the ultrasound room at the discretion of the ultrasound technician under special circumstances. We apologize for any inconvenience.   Please note: We ask at that you not bring children with you during ultrasound (echo/ vascular) testing. Due to room size and safety concerns, children are not allowed in the ultrasound rooms during exams. Our front office staff cannot provide observation of children in our lobby area while testing is being conducted. An adult accompanying a patient to their appointment will only be allowed in the ultrasound room at the discretion of the ultrasound technician under special circumstances. We apologize for any inconvenience.    Follow-Up: At [facility], you and your health needs are our priority.  As part of our continuing mission to provide you with exceptional heart care, we have created designated Provider Care Teams.  These Care Teams include your primary Cardiologist (physician) and Advanced Practice Providers (APPs -  Physician Assistants and Nurse Practitioners) who all work together to provide you with the care you need, when you need it.  We recommend signing up for the patient portal called MyChart.  Sign up information is provided on this After Visit Summary.  MyChart is used to connect with patients for Virtual Visits (Telemedicine).  Patients are able to view lab/test results, encounter notes, upcoming  appointments, etc.  Non-urgent messages can be sent to your provider as well.   To learn more about what you can do with MyChart, go to [email or web address].    Your next appointment:   3 month(s)  The format for your next appointment:   In  Person  Provider:   Lamar Fitch, MD    Other Instructions NA

## 2024-07-21 NOTE — Progress Notes (Unsigned)
 Cardiology Office Note:    Date:  07/21/2024   ID:  Maria Ingram, Maria Ingram [birth year], MRN [id number]  PCP:  Trinidad Glisson, MD  Cardiologist:  Lamar Fitch, MD    Referring MD: Trinidad Glisson, MD   Chief Complaint  Patient presents with   Follow-up  Doing fine  History of Present Illness:    Maria Ingram is a 73 y.o. female past medical history significant for rheumatic heart disease, status post mitral valve replacement done in [place] in 2008, she did receive send showed valve, size unknown, additional problem include essential hypertension, dyslipidemia, prediabetes, recently atrial fibrillation.  She did have episode of atrial fibrillation and did have a transesophageal echocardiogram and cardioversion however shortly after that went back to atrial fibrillation.  She did see our EP team and she is scheduled to have atrial fibrillation ablation done in January.  She complained of being weak tired exhausted and simply does not feel well but overall otherwise denies having any chest pain shortness of breath.  She does complain of having some leg pain.  There are some contemplation about giving her some steroid shots but she gets without about that  Past Medical History:  Diagnosis Date   Abnormal stress test 02/11/2018   Mid and apical portion of the anterior wall ischemia and stress test in July 2019 Cardiac catheterization after that showing normal coronaries   Adenomatous polyp of descending colon    Anemia 11/10/2019   Blood transfusion without reported diagnosis 1981   post delivery of child   Borderline diabetes 02/08/2018   Cataract    bilateral sx   Diabetes mellitus without complication (HCC)    diet controlled- no meds   Dyslipidemia 02/08/2018   Essential hypertension 02/08/2018   GERD (gastroesophageal reflux disease)    H/O mitral valve replacement with mechanical valve 01/08/2015   Done in 2000, [facility]  Formatting of this note might be  different from the original. Done in 2000, [facility]   History of mitral valve prosthesis 02/08/2018   Done many years ago in [place]   History of rheumatic heart disease    History of strongyloidiasis    Hyperlipidemia    on meds   Hypertension    on meds   Osteoarthritis of left knee 09/13/2018   bilateral hands/legs   Pain in left knee 05/20/2018   Persistent atrial fibrillation (HCC) 01/06/2024   Positive colorectal cancer screening using Cologuard test    Pre-diabetes    Borderline   Pre-operative clearance 02/08/2018   Psoriasis    Seasonal allergies     Past Surgical History:  Procedure Laterality Date   BIOPSY  11/18/2021   Procedure: BIOPSY;  Surgeon: Charlanne Groom, MD;  Location: [facility];  Service: Endoscopy;;  EGD and COLON   BREAST BIOPSY Right 11/25/2017   atrophic breast tissue with stromal fibrosis.   CARDIOVERSION N/A 02/18/2024   Procedure: CARDIOVERSION;  Surgeon: Lonni Slain, MD;  [facility]VASIVE CV LAB;  Service: Cardiovascular;  Laterality: N/A;   COLONOSCOPY WITH PROPOFOL  N/A 11/18/2021   Procedure: COLONOSCOPY WITH PROPOFOL ;  Surgeon: Charlanne Groom, MD;  Location: [facility];  Service: Endoscopy;  Laterality: N/A;   CORNEA LACERATION REPAIR Bilateral    ESOPHAGOGASTRODUODENOSCOPY (EGD) WITH PROPOFOL  N/A 11/18/2021   Procedure: ESOPHAGOGASTRODUODENOSCOPY (EGD) WITH PROPOFOL ;  Surgeon: Charlanne Groom, MD;  Location: [facility];  Service: Endoscopy;  Laterality: N/A;   KNEE ARTHROSCOPY W/ MENISCAL REPAIR Right 2018   LEFT HEART CATH AND CORONARY ANGIOGRAPHY  N/A 02/16/2018   Procedure: LEFT HEART CATH AND CORONARY ANGIOGRAPHY;  Surgeon: Jordan, Peter M, MD;  Location[facility] INVASIVE CV LAB;  Service: Cardiovascular;  Laterality: N/A;   POLYPECTOMY  11/18/2021   Procedure: POLYPECTOMY;  Surgeon: Charlanne Groom, MD;  Location: [facility];  Service: Endoscopy;;   SUBMUCOSAL TATTOO INJECTION  11/18/2021   Procedure: SUBMUCOSAL TATTOO INJECTION;   Surgeon: Charlanne Groom, MD;  Location: [facility];  Service: Endoscopy;;   TRANSESOPHAGEAL ECHOCARDIOGRAM (CATH LAB) N/A 02/18/2024   Procedure: TRANSESOPHAGEAL ECHOCARDIOGRAM;  Surgeon: Lonni Slain, MD;  Location: [facility] INVASIVE CV LAB;  Service: Cardiovascular;  Laterality: N/A;   VALVE REPLACEMENT     St Jude mitral valve prosthesis    Current Medications: Active Medications[1]   Allergies:   Patient has no known allergies.   Social History   Socioeconomic History   Marital status: Married    Spouse name: Not on file   Number of children: Not on file   Years of education: Not on file   Highest education level: Not on file  Occupational History   Not on file  Tobacco Use   Smoking status: Never    Passive exposure: Never   Smokeless tobacco: Never  Vaping Use   Vaping status: Never Used  Substance and Sexual Activity   Alcohol use: Not Currently   Drug use: Never   Sexual activity: Not on file  Other Topics Concern   Not on file  Social History Narrative   Not on file   Social Drivers of Health   Tobacco Use: Low Risk (07/21/2024)   Patient History    Smoking Tobacco Use: Never    Smokeless Tobacco Use: Never    Passive Exposure: Never  Financial Resource Strain: Not on file  Food Insecurity: Not on file  Transportation Needs: Not on file  Physical Activity: Not on file  Stress: Not on file  Social Connections: Not on file  Depression (EYV7-0): Not on file  Alcohol Screen: Not on file  Housing: Not on file  Utilities: Not on file  Health Literacy: Not on file     Family History: The patient's family history includes Asthma in her daughter, father, and son; Colon cancer (age of onset: 56) in her father; Colon polyps (age of onset: 38) in her father; Emphysema in her mother and sister. There is no history of Esophageal cancer, Rectal cancer, Stomach cancer, Immunodeficiency, Eczema, Atopy, or Angioedema. ROS:   Please see the history of present  illness.    All 14 point review of systems negative except as described per history of present illness  EKGs/Labs/Other Studies Reviewed:         Recent Labs: 01/06/2024: NT-Pro BNP 577 02/27/2024: BUN 17; Creatinine, Ser 0.53; Hemoglobin 12.4; Platelets 235; Potassium 4.0; Sodium 136  Recent Lipid Panel No results found for: CHOL, TRIG, HDL, CHOLHDL, VLDL, LDLCALC, LDLDIRECT  Physical Exam:    VS:  BP (!) 150/70   Pulse 85   Ht 5' 2.6 (1.59 m)   Wt 148 lb (67.1 kg)   SpO2 97%   BMI 26.55 kg/m     Wt Readings from Last 3 Encounters:  07/21/24 148 lb (67.1 kg)  05/02/24 143 lb (64.9 kg)  04/27/24 148 lb (67.1 kg)     GEN:  Well nourished, well developed in no acute distress HEENT: Normal NECK: No JVD; No carotid bruits LYMPHATICS: No lymphadenopathy CARDIAC: Irregularly irregular, mechanical valve sounds crisp, no rubs, no gallops RESPIRATORY:  Clear to auscultation  without rales, wheezing or rhonchi  ABDOMEN: Soft, non-tender, non-distended MUSCULOSKELETAL:  No edema; No deformity  SKIN: Warm and dry LOWER EXTREMITIES: no swelling NEUROLOGIC:  Alert and oriented x 3 PSYCHIATRIC:  Normal affect   ASSESSMENT:    1. Persistent atrial fibrillation (HCC)   2. Essential hypertension   3. Gastroesophageal reflux disease, unspecified whether esophagitis present   4. Mixed hyperlipidemia   5. H/O mitral valve replacement with mechanical valve    PLAN:    In order of problems listed above:  Persistent atrial fibrillation, awaiting ablation which will be done on 21 January.  Continue anticoagulation. Essential hypertension blood pressure likely elevated today but she is upset about in the situation.  Will continue monitoring. Status post mitral valve replacement, Saint Jude valve  Leg pain.  Does not look like not vascular problem however for completion I will do segmental pressures.   Medication Adjustments/Labs and Tests Ordered: Current medicines are  reviewed at length with the patient today.  Concerns regarding medicines are outlined above.  Orders Placed This Encounter  Procedures   EKG 12-Lead   Medication changes: No orders of the defined types were placed in this encounter.   Signed, Lamar DOROTHA Fitch, MD, [facility] 07/21/2024 3:23 PM    [facility]     [1]  Current Meds  Medication Sig   atorvastatin (LIPITOR) 10 MG tablet Take 10 mg by mouth daily.   augmented betamethasone dipropionate (DIPROLENE-AF) 0.05 % cream Apply 1 Application topically 2 (two) times daily.   budesonide  (PULMICORT ) 0.5 MG/2ML nebulizer solution Take 2 mLs (0.5 mg total) by nebulization 2 (two) times daily as needed (Cold).   citalopram  (CELEXA ) 10 MG tablet Take 10 mg by mouth daily.   colestipol  (COLESTID ) 1 g tablet Take one tablet once daily as directed.   enoxaparin  (LOVENOX ) 60 MG/0.6ML injection Inject 60 mg into the skin every 3 (three) months.   ENSTILAR 0.005-0.064 % FOAM Apply 1 application  topically See admin instructions. Twice weekly of needed Psoriasis   famotidine  (PEPCID ) 20 MG tablet Take 1 tablet (20 mg total) by mouth at bedtime. Please call [phone] to schedule an office visit for more refills   ipratropium-albuterol  (DUONEB) 0.5-2.5 (3) MG/3ML SOLN Take 3 mLs by nebulization every 6 (six) hours as needed.   losartan  (COZAAR ) 100 MG tablet Take 100 mg by mouth daily.   memantine  (NAMENDA ) 5 MG tablet Take 5 mg by mouth 2 (two) times daily.   montelukast  (SINGULAIR ) 10 MG tablet Take one tablet once daily as directed.   pantoprazole (PROTONIX) 40 MG tablet Take 40 mg by mouth daily as needed.   SKYRIZI 150 MG/ML SOSY prefilled syringe Inject 1 mL into the skin every 3 (three) months.   warfarin (COUMADIN) 3 MG tablet Take 3 mg by mouth See admin instructions. Everyday except Wednesday and Sunday   warfarin (COUMADIN) 4 MG tablet Take 4 mg by mouth as directed. On Wednesday and Sunday

## 2024-07-25 ENCOUNTER — Telehealth: Payer: Self-pay

## 2024-07-25 NOTE — Telephone Encounter (Signed)
 Spoke with Pt's Grandaughter and went over Instructions - she will go to [place] office for labs and have them print Instruction letter for her.   She has my direct number and will call with any questions or concerns.

## 2024-07-25 NOTE — Telephone Encounter (Signed)
-----   Message from Nurse Maeola SQUIBB, RN sent at 07/04/2024 10:01 AM EST ----- Regarding: 08/26/24  AFib ablation ---  SPANISH (CALL DTR/GRANDDAUGHTER) ---  PT ON COUMADIN (MESSAGE SENT TO COUMADIN CLINIC)  Precert:  MD: Camnitz Type of ablation: A-fib Diagnosis: tachycardia CPT code: A-fib (06343) Ablation scheduled (date/time): 08/26/24  11:30 am  Procedure:  Added to calendar? Yes Orders entered? Yes Letter complete? No, >30 days before procedure Scheduled with cath lab? Yes Any medications to hold? routine Labs ordered (CBC, BMET, PT/INR if on warfarin): Yes Mapping system: CARTO (lab 4 or 6) CARTO/OPAL rep notified? Yes Cardiac CT needed? No Dye allergy ? N/a Pre-meds ordered and instructions given? N/a Letter method: Mailed/Front desk H&P: 05/02/24 Device: No  Follow-up:  Cassie/Angel, please schedule Routine  ([place] pt).

## 2024-08-01 ENCOUNTER — Telehealth: Payer: Self-pay | Admitting: Gastroenterology

## 2024-08-01 ENCOUNTER — Ambulatory Visit: Admitting: Gastroenterology

## 2024-08-01 NOTE — Progress Notes (Deleted)
 SABRA

## 2024-08-01 NOTE — Telephone Encounter (Signed)
"  error  "

## 2024-08-10 ENCOUNTER — Telehealth (HOSPITAL_COMMUNITY): Payer: Self-pay

## 2024-08-10 NOTE — Telephone Encounter (Signed)
 Spoke with patient's daughter, Maria Ingram, to discuss upcoming procedure via Spanish language interpreter, Lawanda [phone].   CT: not required.  Labs: to be completed.   Any recent signs of acute illness or been started on antibiotics? No Any new medications started?No  Any missed doses of blood thinner?  No  Advised patient to continue taking Coumadin (Warfarin) once daily without missing any doses. Any medications to hold?  routine Medication instructions:  On the morning of your procedure DO NOT take any medication., including Coumadin (Warfarin) or the procedure may be rescheduled. Nothing to eat or drink after midnight prior to your procedure.  Confirmed patient is scheduled for Atrial Fibrillation Ablation on Friday, January 23 with Dr. Inocencio. Instructed patient to arrive at the Main Entrance A at [facility]: [address] [place], [place] [address] and check in at Admitting at 9:30 AM.   Plan to go home the same day, you will only stay overnight if medically necessary. You MUST have a responsible adult to drive you home and MUST be with you the first 24 hours after you arrive home or your procedure could be cancelled.  Maria informed a nurse may call a day before the procedure to confirm arrival time and ensure instructions are followed.  Maria verbalized understanding to all instructions provided and agreed to proceed with procedure.   Advised to contact RN Navigator at [phone], to inform of any new medications started after call or concerns prior to procedure.

## 2024-08-10 NOTE — Telephone Encounter (Signed)
 Spoke with Clotilda, nurse at [facility]/PCP office. States she has not received anything from cardiology office. Made aware of upcoming procedure and need for weekly INRs.She informed that last INR 3.9 on Dec 19 and next check on Jan 19.   Reached out to Maeola SQUIBB., RN to request information for INR check to be sent to PCP office. Nurse agreed to send fax to PCP office.

## 2024-08-12 ENCOUNTER — Telehealth: Payer: Self-pay | Admitting: *Deleted

## 2024-08-12 DIAGNOSIS — I4819 Other persistent atrial fibrillation: Secondary | ICD-10-CM

## 2024-08-12 DIAGNOSIS — Z01812 Encounter for preprocedural laboratory examination: Secondary | ICD-10-CM

## 2024-08-12 LAB — CBC
Hematocrit: 35.7 % (ref 34.0–46.6)
Hemoglobin: 11.2 g/dL (ref 11.1–15.9)
MCH: 29.5 pg (ref 26.6–33.0)
MCHC: 31.4 g/dL — ABNORMAL LOW (ref 31.5–35.7)
MCV: 94 fL (ref 79–97)
Platelets: 350 x10E3/uL (ref 150–450)
RBC: 3.8 x10E6/uL (ref 3.77–5.28)
RDW: 12.1 % (ref 11.7–15.4)
WBC: 6.7 x10E3/uL (ref 3.4–10.8)

## 2024-08-12 LAB — BASIC METABOLIC PANEL WITH GFR
BUN/Creatinine Ratio: 30 — ABNORMAL HIGH (ref 12–28)
BUN: 18 mg/dL (ref 8–27)
CO2: 27 mmol/L (ref 20–29)
Calcium: 8.8 mg/dL (ref 8.7–10.3)
Chloride: 98 mmol/L (ref 96–106)
Creatinine, Ser: 0.61 mg/dL (ref 0.57–1.00)
Glucose: 131 mg/dL — ABNORMAL HIGH (ref 70–99)
Potassium: 4.3 mmol/L (ref 3.5–5.2)
Sodium: 138 mmol/L (ref 134–144)
eGFR: 93 mL/min/1.73

## 2024-08-12 NOTE — Telephone Encounter (Signed)
 Spoke to pt's dtr & granddaughter (granddaughter speaks/understands best english).  Advised pt needs to go to our [place] office today for an INR lab draw.  Aware I will reach out to her PCP, Dr. Trinidad, and ask them to draw an INR next Friday 1/16 & again on 1/21. Will have them fax results to our office.  Grandaughter verbalized understanding and is agreeable to plan

## 2024-08-13 LAB — PROTIME-INR
INR: 3.5 — ABNORMAL HIGH (ref 0.9–1.2)
Prothrombin Time: 34.7 s — ABNORMAL HIGH (ref 9.1–12.0)

## 2024-08-17 ENCOUNTER — Encounter: Payer: Self-pay | Admitting: Allergy and Immunology

## 2024-08-17 ENCOUNTER — Ambulatory Visit (INDEPENDENT_AMBULATORY_CARE_PROVIDER_SITE_OTHER): Payer: Medicaid Other | Admitting: Allergy and Immunology

## 2024-08-17 VITALS — BP 138/84 | HR 78 | Resp 18

## 2024-08-17 DIAGNOSIS — J3089 Other allergic rhinitis: Secondary | ICD-10-CM | POA: Diagnosis not present

## 2024-08-17 DIAGNOSIS — K529 Noninfective gastroenteritis and colitis, unspecified: Secondary | ICD-10-CM | POA: Diagnosis not present

## 2024-08-17 DIAGNOSIS — J4520 Mild intermittent asthma, uncomplicated: Secondary | ICD-10-CM | POA: Diagnosis not present

## 2024-08-17 DIAGNOSIS — R058 Other specified cough: Secondary | ICD-10-CM | POA: Diagnosis not present

## 2024-08-17 MED ORDER — IPRATROPIUM-ALBUTEROL 0.5-2.5 (3) MG/3ML IN SOLN: 3.0000 mL | Freq: Four times a day (QID) | RESPIRATORY_TRACT | 1 refills | Status: DC | PRN

## 2024-08-17 MED ORDER — BUDESONIDE 0.5 MG/2ML IN SUSP: 0.5000 mg | Freq: Two times a day (BID) | RESPIRATORY_TRACT | 2 refills | Status: AC | PRN

## 2024-08-17 NOTE — Progress Notes (Signed)
 "  [place] - [place] - [place] - [place] - [place]   Follow-up Note  Referring Provider: Trinidad Glisson, MD Primary Provider: Trinidad Glisson, MD Date of Office Visit: 08/17/2024  Subjective:   Maria Ingram (DOB: 1950-10-27) is a 74 y.o. female who [facility] in re-evaluation of the following:  HPI: Maria Ingram returns to this clinic in evaluation of allergic rhinitis, cough, diarrhea, history of eosinophilia.  Last saw her in this clinic 31 August 2023.  During her last visit she had an acute respiratory illness with inflammation of her lower airway for which we treated her with a systemic steroid and azithromycin  and gave her DuoNeb to use as needed.  She really did well with that therapy.  She has had to use her DuoNeb about 3 times total for the past year usually for less than a week.  Her diarrhea is still very well-controlled on colestipol .  She has had very little problems with her nose while using montelukast .  She has obtained this years flu vaccine.  Allergies as of 08/17/2024   No Known Allergies      Medication List    atorvastatin 10 MG tablet Commonly known as: LIPITOR Take 10 mg by mouth daily.   augmented betamethasone dipropionate 0.05 % cream Commonly known as: DIPROLENE-AF Apply 1 Application topically 2 (two) times daily.   budesonide  0.5 MG/2ML nebulizer solution Commonly known as: PULMICORT  Take 2 mLs (0.5 mg total) by nebulization 2 (two) times daily as needed (Cold).   citalopram  10 MG tablet Commonly known as: CELEXA  Take 10 mg by mouth daily.   colestipol  1 g tablet Commonly known as: COLESTID  Take one tablet once daily as directed.   enoxaparin  60 MG/0.6ML injection Commonly known as: LOVENOX  Inject 60 mg into the skin every 3 (three) months.   Enstilar 0.005-0.064 % Foam Generic drug: Calcipotriene-Betameth Diprop Apply 1 application  topically See admin  instructions. Twice weekly of needed Psoriasis   famotidine  20 MG tablet Commonly known as: Pepcid  Take 1 tablet (20 mg total) by mouth at bedtime. Please call [phone] to schedule an office visit for more refills   ipratropium-albuterol  0.5-2.5 (3) MG/3ML Soln Commonly known as: DUONEB Take 3 mLs by nebulization every 6 (six) hours as needed.   losartan  100 MG tablet Commonly known as: COZAAR  Take 100 mg by mouth daily.   memantine  5 MG tablet Commonly known as: NAMENDA  Take 5 mg by mouth 2 (two) times daily.   montelukast  10 MG tablet Commonly known as: Singulair  Take one tablet once daily as directed.   pantoprazole 40 MG tablet Commonly known as: PROTONIX Take 40 mg by mouth daily as needed.   Skyrizi 150 MG/ML Sosy prefilled syringe Generic drug: risankizumab-rzaa Inject 1 mL into the skin every 3 (three) months.   warfarin 4 MG tablet Commonly known as: COUMADIN Take 4 mg by mouth as directed. On Wednesday and Sunday   warfarin 3 MG tablet Commonly known as: COUMADIN Take 3 mg by mouth See admin instructions. Everyday except Wednesday and Sunday    Past Medical History:  Diagnosis Date   Abnormal stress test 02/11/2018   Mid and apical portion of the anterior wall ischemia and stress test in July 2019 Cardiac catheterization after that showing normal coronaries   Adenomatous polyp of descending colon    Anemia 11/10/2019   Blood transfusion without reported diagnosis 1981   post delivery of child   Borderline diabetes 02/08/2018  Cataract    bilateral sx   Diabetes mellitus without complication (HCC)    diet controlled- no meds   Dyslipidemia 02/08/2018   Essential hypertension 02/08/2018   GERD (gastroesophageal reflux disease)    H/O mitral valve replacement with mechanical valve 01/08/2015   Done in 2000, [facility]  Formatting of this note might be different from the original. Done in 2000, [facility]   History of mitral valve prosthesis 02/08/2018    Done many years ago in [place]   History of rheumatic heart disease    History of strongyloidiasis    Hyperlipidemia    on meds   Hypertension    on meds   Osteoarthritis of left knee 09/13/2018   bilateral hands/legs   Pain in left knee 05/20/2018   Persistent atrial fibrillation (HCC) 01/06/2024   Positive colorectal cancer screening using Cologuard test    Pre-diabetes    Borderline   Pre-operative clearance 02/08/2018   Psoriasis    Seasonal allergies     Past Surgical History:  Procedure Laterality Date   BIOPSY  11/18/2021   Procedure: BIOPSY;  Surgeon: Charlanne Groom, MD;  Location: [facility];  Service: Endoscopy;;  EGD and COLON   BREAST BIOPSY Right 11/25/2017   atrophic breast tissue with stromal fibrosis.   CARDIOVERSION N/A 02/18/2024   Procedure: CARDIOVERSION;  Surgeon: Lonni Slain, MD;  Location: [facility] INVASIVE CV LAB;  Service: Cardiovascular;  Laterality: N/A;   COLONOSCOPY WITH PROPOFOL  N/A 11/18/2021   Procedure: COLONOSCOPY WITH PROPOFOL ;  Surgeon: Charlanne Groom, MD;  Location: [facility];  Service: Endoscopy;  Laterality: N/A;   CORNEA LACERATION REPAIR Bilateral    ESOPHAGOGASTRODUODENOSCOPY (EGD) WITH PROPOFOL  N/A 11/18/2021   Procedure: ESOPHAGOGASTRODUODENOSCOPY (EGD) WITH PROPOFOL ;  Surgeon: Charlanne Groom, MD;  Location: [facility];  Service: Endoscopy;  Laterality: N/A;   KNEE ARTHROSCOPY W/ MENISCAL REPAIR Right 2018   LEFT HEART CATH AND CORONARY ANGIOGRAPHY N/A 02/16/2018   Procedure: LEFT HEART CATH AND CORONARY ANGIOGRAPHY;  Surgeon: Jordan, Peter M, MD;  Location: [facility] INVASIVE CV LAB;  Service: Cardiovascular;  Laterality: N/A;   POLYPECTOMY  11/18/2021   Procedure: POLYPECTOMY;  Surgeon: Charlanne Groom, MD;  Location: [facility];  Service: Endoscopy;;   SUBMUCOSAL TATTOO INJECTION  11/18/2021   Procedure: SUBMUCOSAL TATTOO INJECTION;  Surgeon: Charlanne Groom, MD;  Location: [facility];  Service: Endoscopy;;   TRANSESOPHAGEAL  ECHOCARDIOGRAM (CATH LAB) N/A 02/18/2024   Procedure: TRANSESOPHAGEAL ECHOCARDIOGRAM;  Surgeon: Lonni Slain, MD;  Location: [facility] INVASIVE CV LAB;  Service: Cardiovascular;  Laterality: N/A;   VALVE REPLACEMENT     St Jude mitral valve prosthesis    Review of systems negative except as noted in HPI / PMHx or noted below:  Review of Systems  Constitutional: Negative.   HENT: Negative.    Eyes: Negative.   Respiratory: Negative.    Cardiovascular: Negative.   Gastrointestinal: Negative.   Genitourinary: Negative.   Musculoskeletal: Negative.   Skin: Negative.   Neurological: Negative.   Endo/Heme/Allergies: Negative.   Psychiatric/Behavioral: Negative.       Objective:   Vitals:   08/17/24 1020  BP: 138/84  Pulse: 78  Resp: 18  SpO2: 96%          Physical Exam Constitutional:      Appearance: She is not diaphoretic.  HENT:     Head: Normocephalic.     Right Ear: Tympanic membrane, ear canal and external ear normal.     Left Ear: Tympanic membrane, ear canal and external  ear normal.     Nose: Nose normal. No mucosal edema or rhinorrhea.     Mouth/Throat:     Pharynx: Uvula midline. No oropharyngeal exudate.  Eyes:     Conjunctiva/sclera: Conjunctivae normal.  Neck:     Thyroid: No thyromegaly.     Trachea: Trachea normal. No tracheal tenderness or tracheal deviation.  Cardiovascular:     Rate and Rhythm: Normal rate and regular rhythm.     Heart sounds: S1 normal and S2 normal. Murmur (systolic) heard.  Pulmonary:     Effort: No respiratory distress.     Breath sounds: Normal breath sounds. No stridor. No wheezing or rales.  Lymphadenopathy:     Head:     Right side of head: No tonsillar adenopathy.     Left side of head: No tonsillar adenopathy.     Cervical: No cervical adenopathy.  Skin:    Findings: No erythema or rash.     Nails: There is no clubbing.  Neurological:     Mental Status: She is alert.     Diagnostics: none  Assessment and  Plan:   1. Chronic diarrhea   2. Asthma, mild intermittent, well-controlled   3. Perennial allergic rhinitis   4. Other cough     1.  Allergen avoidance measures - cockroach  2.  Continue montelukast  10 mg - 1 tablet 1 time per day  3.  Continue colestipol  1 g -1 tablet 1 time per day  4. Continue Duoneb - nebulize every 4-6 hours if needed  4.  Return to clinic in 12 months or earlier if problem  6.  Influenza = Tamiflu, Covid = Paxlovid  Maria Ingram is doing very well while using montelukast  as an anti-inflammatory agent for her respiratory tract and using colestipol  to treat her chronic diarrhea.  She has the option of using DuoNebs should it be required in the future which appears to be very rare at this point in time and is probably tied up with viral respiratory tract infections..  We will see her back in this clinic in 1 year or earlier if there is a problem.  Camellia Denis, MD Allergy  / Immunology [facility] "

## 2024-08-17 NOTE — Patient Instructions (Addendum)
" °  1.  Allergen avoidance measures - cockroach  2.  Continue montelukast  10 mg - 1 tablet 1 time per day  3.  Continue colestipol  1 g -1 tablet 1 time per day  4. Continue Duoneb - nebulize every 4-6 hours if needed  4.  Return to clinic in 12 months or earlier if problem  6.  Influenza = Tamiflu, Covid = Paxlovid "

## 2024-08-18 ENCOUNTER — Encounter: Payer: Self-pay | Admitting: Allergy and Immunology

## 2024-08-22 ENCOUNTER — Ambulatory Visit

## 2024-08-22 DIAGNOSIS — M79605 Pain in left leg: Secondary | ICD-10-CM

## 2024-08-22 DIAGNOSIS — M79604 Pain in right leg: Secondary | ICD-10-CM | POA: Diagnosis not present

## 2024-08-22 LAB — VAS US ABI WITH/WO TBI
Left ABI: 1.2
Right ABI: 1.19

## 2024-08-23 NOTE — Telephone Encounter (Signed)
 Pt is sched for AF ablation on 1/23. She has h/o a mechanical MVR. Last INRs 3.9, 3.5, 4.8 and due tomorrow per PCP office. The nurse was unable to provide INR goal range. Are these results acceptable?    Requested PCP office to fax results to [facility].

## 2024-08-24 ENCOUNTER — Ambulatory Visit: Payer: Self-pay | Admitting: Cardiology

## 2024-08-24 NOTE — Telephone Encounter (Signed)
 Spoke with Izetta at [facility]/PCP office regarding provider recommendations. Requested coumadin to be adjusted based on INR level on today, so patient can hopefully meet the INR goal indicated, when checked on day of procedure, Jan 23.

## 2024-08-25 ENCOUNTER — Other Ambulatory Visit: Payer: Self-pay | Admitting: Allergy and Immunology

## 2024-08-25 ENCOUNTER — Ambulatory Visit: Admitting: Gastroenterology

## 2024-08-25 NOTE — Pre-Procedure Instructions (Signed)
 Attempted to call patient regarding procedure instructions for tomorrow.  No answer.  Below are the instructions: Arrival time 0930 Nothing to eat or drink after midnight No meds AM of procedure Responsible person to drive you home and stay with you for 24 hrs  Have you missed any doses of anti-coagulant Coumadin- should be taken once a day, if you have missed any doses please let us  know.

## 2024-08-26 ENCOUNTER — Ambulatory Visit (HOSPITAL_COMMUNITY): Admitting: Anesthesiology

## 2024-08-26 ENCOUNTER — Other Ambulatory Visit: Payer: Self-pay

## 2024-08-26 ENCOUNTER — Ambulatory Visit (HOSPITAL_COMMUNITY)
Admission: RE | Admit: 2024-08-26 | Discharge: 2024-08-26 | Disposition: A | Discharge status: Home / Self Care | Attending: Cardiology | Admitting: Cardiology

## 2024-08-26 ENCOUNTER — Encounter (HOSPITAL_COMMUNITY)
Admission: RE | Disposition: A | Discharge status: Home / Self Care | Payer: Self-pay | Source: Home / Self Care | Attending: Cardiology

## 2024-08-26 DIAGNOSIS — K449 Diaphragmatic hernia without obstruction or gangrene: Secondary | ICD-10-CM | POA: Diagnosis not present

## 2024-08-26 DIAGNOSIS — E119 Type 2 diabetes mellitus without complications: Secondary | ICD-10-CM | POA: Diagnosis not present

## 2024-08-26 DIAGNOSIS — I4819 Other persistent atrial fibrillation: Secondary | ICD-10-CM | POA: Insufficient documentation

## 2024-08-26 DIAGNOSIS — Z79899 Other long term (current) drug therapy: Secondary | ICD-10-CM | POA: Diagnosis not present

## 2024-08-26 DIAGNOSIS — I10 Essential (primary) hypertension: Secondary | ICD-10-CM | POA: Insufficient documentation

## 2024-08-26 DIAGNOSIS — Z952 Presence of prosthetic heart valve: Secondary | ICD-10-CM | POA: Diagnosis not present

## 2024-08-26 DIAGNOSIS — Z7901 Long term (current) use of anticoagulants: Secondary | ICD-10-CM

## 2024-08-26 DIAGNOSIS — K219 Gastro-esophageal reflux disease without esophagitis: Secondary | ICD-10-CM | POA: Insufficient documentation

## 2024-08-26 DIAGNOSIS — D6869 Other thrombophilia: Secondary | ICD-10-CM

## 2024-08-26 DIAGNOSIS — E785 Hyperlipidemia, unspecified: Secondary | ICD-10-CM | POA: Insufficient documentation

## 2024-08-26 DIAGNOSIS — I4891 Unspecified atrial fibrillation: Secondary | ICD-10-CM | POA: Diagnosis not present

## 2024-08-26 DIAGNOSIS — R7303 Prediabetes: Secondary | ICD-10-CM | POA: Insufficient documentation

## 2024-08-26 LAB — GLUCOSE, CAPILLARY
Glucose-Capillary: 129 mg/dL — ABNORMAL HIGH (ref 70–99)
Glucose-Capillary: 154 mg/dL — ABNORMAL HIGH (ref 70–99)

## 2024-08-26 LAB — POCT ACTIVATED CLOTTING TIME: Activated Clotting Time: 394 s

## 2024-08-26 LAB — PROTIME-INR
INR: 2.1 — ABNORMAL HIGH (ref 0.8–1.2)
Prothrombin Time: 24.6 s — ABNORMAL HIGH (ref 11.4–15.2)

## 2024-08-26 MED ORDER — PROTAMINE SULFATE 10 MG/ML IV SOLN
INTRAVENOUS | Status: DC | PRN
  Administered 2024-08-26: 40 mg via INTRAVENOUS

## 2024-08-26 MED ORDER — CEFAZOLIN SODIUM-DEXTROSE 2-3 GM-%(50ML) IV SOLR
INTRAVENOUS | Status: DC | PRN
  Administered 2024-08-26: 2 g via INTRAVENOUS

## 2024-08-26 MED ORDER — ACETAMINOPHEN 500 MG PO TABS
1000.0000 mg | ORAL_TABLET | Freq: Once | ORAL | Status: AC
  Administered 2024-08-26: 1000 mg via ORAL
  Filled 2024-08-26: qty 2

## 2024-08-26 MED ORDER — SODIUM CHLORIDE 0.9 % IV SOLN: 250.0000 mL | INTRAVENOUS | Status: DC | PRN

## 2024-08-26 MED ORDER — ONDANSETRON HCL 4 MG/2ML IJ SOLN
INTRAMUSCULAR | Status: DC | PRN
  Administered 2024-08-26: 4 mg via INTRAVENOUS

## 2024-08-26 MED ORDER — WARFARIN SODIUM 3 MG PO TABS
3.0000 mg | ORAL_TABLET | Freq: Once | ORAL | Status: AC
  Administered 2024-08-26: 3 mg via ORAL
  Filled 2024-08-26: qty 1

## 2024-08-26 MED ORDER — LIDOCAINE 2% (20 MG/ML) 5 ML SYRINGE
INTRAMUSCULAR | Status: DC | PRN
  Administered 2024-08-26: 60 mg via INTRAVENOUS

## 2024-08-26 MED ORDER — SUGAMMADEX SODIUM 200 MG/2ML IV SOLN
INTRAVENOUS | Status: DC | PRN
  Administered 2024-08-26: 200 mg via INTRAVENOUS

## 2024-08-26 MED ORDER — SODIUM CHLORIDE 0.9% FLUSH: 3.0000 mL | Freq: Two times a day (BID) | INTRAVENOUS | Status: DC

## 2024-08-26 MED ORDER — PHENYLEPHRINE 80 MCG/ML (10ML) SYRINGE FOR IV PUSH (FOR BLOOD PRESSURE SUPPORT)
PREFILLED_SYRINGE | INTRAVENOUS | Status: DC | PRN
  Administered 2024-08-26: 80 ug via INTRAVENOUS
  Administered 2024-08-26: 160 ug via INTRAVENOUS
  Administered 2024-08-26: 20 ug via INTRAVENOUS

## 2024-08-26 MED ORDER — HEPARIN SODIUM (PORCINE) 1000 UNIT/ML IJ SOLN
INTRAMUSCULAR | Status: DC | PRN
  Administered 2024-08-26: 14000 [IU] via INTRAVENOUS

## 2024-08-26 MED ORDER — PROPOFOL 10 MG/ML IV BOLUS
INTRAVENOUS | Status: DC | PRN
  Administered 2024-08-26: 100 mg via INTRAVENOUS

## 2024-08-26 MED ORDER — PHENYLEPHRINE HCL-NACL 20-0.9 MG/250ML-% IV SOLN
INTRAVENOUS | Status: DC | PRN
  Administered 2024-08-26: 40 ug/min via INTRAVENOUS

## 2024-08-26 MED ORDER — FAMOTIDINE IN NACL 20-0.9 MG/50ML-% IV SOLN
20.0000 mg | Freq: Once | INTRAVENOUS | Status: AC
  Administered 2024-08-26: 20 mg via INTRAVENOUS
  Filled 2024-08-26: qty 50

## 2024-08-26 MED ORDER — HEPARIN SODIUM (PORCINE) 1000 UNIT/ML IJ SOLN
INTRAMUSCULAR | Status: AC
  Filled 2024-08-26: qty 10

## 2024-08-26 MED ORDER — SODIUM CHLORIDE 0.9% FLUSH: 3.0000 mL | INTRAVENOUS | Status: DC | PRN

## 2024-08-26 MED ORDER — ACETAMINOPHEN 325 MG PO TABS: 650.0000 mg | ORAL_TABLET | ORAL | Status: DC | PRN

## 2024-08-26 MED ORDER — SODIUM CHLORIDE 0.9 % IV SOLN: INTRAVENOUS | Status: DC

## 2024-08-26 MED ORDER — HEPARIN (PORCINE) IN NACL 1000-0.9 UT/500ML-% IV SOLN
INTRAVENOUS | Status: DC | PRN
  Administered 2024-08-26 (×2): 500 mL

## 2024-08-26 MED ORDER — ATROPINE SULFATE 1 MG/10ML IJ SOSY
PREFILLED_SYRINGE | INTRAMUSCULAR | Status: DC | PRN
  Administered 2024-08-26: 1 mg via INTRAVENOUS

## 2024-08-26 MED ORDER — HEPARIN SODIUM (PORCINE) 1000 UNIT/ML IJ SOLN
INTRAMUSCULAR | Status: DC | PRN
  Administered 2024-08-26: 1000 [IU] via INTRAVENOUS

## 2024-08-26 MED ORDER — ONDANSETRON HCL 4 MG/2ML IJ SOLN: 4.0000 mg | Freq: Four times a day (QID) | INTRAMUSCULAR | Status: DC | PRN

## 2024-08-26 MED ORDER — ROCURONIUM BROMIDE 10 MG/ML (PF) SYRINGE
PREFILLED_SYRINGE | INTRAVENOUS | Status: DC | PRN
  Administered 2024-08-26: 50 mg via INTRAVENOUS

## 2024-08-26 MED ORDER — CEFAZOLIN SODIUM-DEXTROSE 2-4 GM/100ML-% IV SOLN
INTRAVENOUS | Status: AC
  Filled 2024-08-26: qty 100

## 2024-08-26 NOTE — Anesthesia Procedure Notes (Signed)
 Procedure Name: Intubation Date/Time: 08/26/2024 11:41 AM  Performed by: Alen Motto D, CRNAPre-anesthesia Checklist: Patient identified, Emergency Drugs available, Suction available and Patient being monitored Patient Re-evaluated:Patient Re-evaluated prior to induction Oxygen Delivery Method: Circle System Utilized Preoxygenation: Pre-oxygenation with 100% oxygen Induction Type: IV induction Ventilation: Mask ventilation without difficulty Laryngoscope Size: Mac and 3 Grade View: Grade I Tube type: Oral Tube size: 7.0 mm Number of attempts: 1 Airway Equipment and Method: Stylet and Oral airway Placement Confirmation: ETT inserted through vocal cords under direct vision, positive ETCO2 and breath sounds checked- equal and bilateral Secured at: 21 cm Tube secured with: Tape Dental Injury: Teeth and Oropharynx as per pre-operative assessment

## 2024-08-26 NOTE — Discharge Instructions (Signed)

## 2024-08-26 NOTE — Anesthesia Preprocedure Evaluation (Addendum)
 "                                  Anesthesia Evaluation  Patient identified by MRN, date of birth, ID band Patient awake    Reviewed: Allergy  & Precautions, NPO status , Patient's Chart, lab work & pertinent test results  Airway Mallampati: II  TM Distance: >3 FB Neck ROM: Limited    Dental  (+) Dental Advisory Given, Edentulous Lower   Pulmonary neg pulmonary ROS   Pulmonary exam normal breath sounds clear to auscultation       Cardiovascular hypertension, Pt. on medications + dysrhythmias Atrial Fibrillation + Valvular Problems/Murmurs (H/O mitral valve replacement with mechanical valve)  Rhythm:Regular Rate:Normal  TEE 02/2024  1. LV not well visualized due to shadowing from mitral valve in  midesophageal views. Transgastric vews were not sufficient to evaluate  wall motion, but function appears grossly normal. Left ventricular ejection  fraction, by estimation, is grossly normal%. The left ventricle has Not well  visualized function. Left ventricular endocardial border not optimally  defined to evaluate regional wall motion. The left ventricular internal  cavity size was not well visualized.   2. Right ventricular systolic function is normal. The right ventricular size  is normal.   3. Left atrial appendage not visualized despite multiple views/interrogation  strategies. There is a small fossa coming from the left atrium in the normal  location of the left atrium, seen in transgastric views. This is shallow and  does not appear to have a distal portion. No clot visualized in fossa.  Question whether LAA surgically ligated, no chest imaging available for  comparison. Left atrial size was moderately dilated. No left atrial/left atrial  appendage thrombus was detected.   4. Right atrial size was mild to moderately dilated.   5. The mitral valve has been repaired/replaced. Trivial mitral valve  regurgitation. There is a unknown size St. Jude mechanical valve  present  in the mitral position. Procedure Date: 2000. Echo findings are consistent  with normal structure and function of the mitral valve prosthesis.   6. Tricuspid valve regurgitation is moderate.   7. The aortic valve is tricuspid. There is mild calcification of the  aortic valve. Aortic valve regurgitation is not visualized. No aortic  stenosis is present.   8. There is Moderate (Grade III) plaque involving the descending aorta.   9. Agitated saline contrast bubble study was negative, with no evidence  of any interatrial shunt.  10. 3D performed of the mitral valve and 3D performed of the aortic valve and  demonstrates Normal function of mechanical mitral valve, normal aortic valve.   Conclusion(s)/Recommendation(s): No LA/LAA thrombus identified. Successful  cardioversion performed with restoration of normal sinus rhythm. See  comments regarding lack of typical left atrial appendage.      Neuro/Psych negative neurological ROS  negative psych ROS   GI/Hepatic Neg liver ROS, hiatal hernia,GERD  Medicated and Poorly Controlled,,  Endo/Other  diabetes    Renal/GU negative Renal ROS     Musculoskeletal  (+) Arthritis ,    Abdominal   Peds  Hematology  (+) Blood dyscrasia, anemia   Anesthesia Other Findings   Reproductive/Obstetrics                              Anesthesia Physical Anesthesia Plan  ASA: 3  Anesthesia Plan: General   Post-op  Pain Management: Tylenol  PO (pre-op)*   Induction: Intravenous and Rapid sequence  PONV Risk Score and Plan: 3 and Ondansetron , Dexamethasone and Treatment may vary due to age or medical condition  Airway Management Planned: Oral ETT and Video Laryngoscope Planned  Additional Equipment:   Intra-op Plan:   Post-operative Plan: Extubation in OR  Informed Consent: I have reviewed the patients History and Physical, chart, labs and discussed the procedure including the risks, benefits and  alternatives for the proposed anesthesia with the patient or authorized representative who has indicated his/her understanding and acceptance.     Dental advisory given  Plan Discussed with: CRNA  Anesthesia Plan Comments:          Anesthesia Quick Evaluation  "

## 2024-08-26 NOTE — H&P (Signed)
" °  Electrophysiology Office Note:   Date:  08/26/2024  ID:  Maria Ingram, DOB Jun 19, 1951, MRN [id number]  Primary Cardiologist: Lamar Fitch, MD Primary Heart Failure: None Electrophysiologist: None      History of Present Illness:   Maria Ingram is a 74 y.o. female with h/o rheumatic heart disease post MVR in [place] in 2008, hypertension, hyperlipidemia, prediabetes, atrial fibrillation seen today for  for Electrophysiology evaluation of atrial fibrillation at the request of Robert Krasowski.    Today, denies symptoms of palpitations, chest pain, dyspnea, orthopnea, PND, lower extremity edema, claudication, dizziness, presyncope, syncope, bleeding, or neurologic sequela. The patient is tolerating medications without difficulties. Plan ablation today.   EP Information / Studies Reviewed:    EKG is not ordered today. EKG from 04/27/2024 reviewed which showed atrial fibrillation        Risk Assessment/Calculations:    CHA2DS2-VASc Score = 5   This indicates a 7.2% annual risk of stroke. The patient's score is based upon: CHF History: 0 HTN History: 1 Diabetes History: 1 Stroke History: 0 Vascular Disease History: 0 Age Score: 2 Gender Score: 1            Physical Exam:   VS:  BP (!) 158/71   Pulse 91   Temp 98.6 F (37 C) (Oral)   Resp 17   Ht 5' 2 (1.575 m)   Wt 66.2 kg   SpO2 92%   BMI 26.70 kg/m    Wt Readings from Last 3 Encounters:  08/26/24 66.2 kg  07/21/24 67.1 kg  05/02/24 64.9 kg    GEN: Well nourished, well developed in no acute distress NECK: No JVD; No carotid bruits CARDIAC: Regular rate and rhythm, no murmurs, rubs, gallops RESPIRATORY:  Clear to auscultation without rales, wheezing or rhonchi  ABDOMEN: Soft, non-tender, non-distended EXTREMITIES:  No edema; No deformity    ASSESSMENT AND PLAN:    1.  Persistent atrial fibrillation: [name] has presented today for surgery, with the diagnosis of AF.  The  various methods of treatment have been discussed with the patient and family. After consideration of risks, benefits and other options for treatment, the patient has consented to  Procedure(s): Catheter ablation as a surgical intervention .  Risks include but not limited to complete heart block, stroke, esophageal damage, nerve damage, bleeding, vascular damage, tamponade, perforation, MI, and death. The patient's history has been reviewed, patient examined, no change in status, stable for surgery.  I have reviewed the patient's chart and labs.  Questions were answered to the patient's satisfaction.    Lenard Kampf Inocencio, MD 08/26/2024 9:23 AM  "

## 2024-08-26 NOTE — Transfer of Care (Signed)
 Immediate Anesthesia Transfer of Care Note  Patient: Maria Ingram  Procedure(s) Performed: ATRIAL FIBRILLATION ABLATION  Patient Location: PACU  Anesthesia Type:General  Level of Consciousness: awake, alert , and oriented  Airway & Oxygen Therapy: Patient Spontanous Breathing and Patient connected to face mask oxygen  Post-op Assessment: Report given to RN and Post -op Vital signs reviewed and stable  Post vital signs: Reviewed and stable  Last Vitals:  Vitals Value Taken Time  BP    Temp    Pulse    Resp    SpO2      Last Pain:  Vitals:   08/26/24 0948  TempSrc:   PainSc: 0-No pain         Complications: No notable events documented.

## 2024-08-28 ENCOUNTER — Encounter (HOSPITAL_COMMUNITY): Payer: Self-pay | Admitting: Cardiology

## 2024-08-29 ENCOUNTER — Telehealth (HOSPITAL_COMMUNITY): Payer: Self-pay

## 2024-08-29 NOTE — Anesthesia Postprocedure Evaluation (Signed)
"   Anesthesia Post Note  Patient: Maria Ingram  Procedure(s) Performed: ATRIAL FIBRILLATION ABLATION     Patient location during evaluation: PACU Anesthesia Type: General Level of consciousness: sedated and patient cooperative Pain management: pain level controlled Vital Signs Assessment: post-procedure vital signs reviewed and stable Respiratory status: spontaneous breathing Cardiovascular status: stable Anesthetic complications: no   No notable events documented.  Last Vitals:  Vitals:   08/26/24 1530 08/26/24 1600  BP: 122/64 (!) 142/68  Pulse: 75 76  Resp: (!) 26 14  Temp:    SpO2: (!) 88% (!) 89%    Last Pain:  Vitals:   08/26/24 1357  TempSrc:   PainSc: 0-No pain                 Norleen Pope      "

## 2024-08-29 NOTE — Telephone Encounter (Signed)
 Spoke with patient's daughter, Jacqueline to complete post procedure follow up call/ DPR on file. She reports no complications with groin sites.   Instructions reviewed with patient:  It is normal to have bruising, tenderness, mild swelling, and a pea or marble sized lump/knot at the groin site which can take up to three months to resolve.  Get help right away if you notice sudden swelling at the puncture site.  Check your puncture site every day for signs of infection: fever, redness, swelling, pus drainage, warmth, foul odor or excessive pain. If this occurs, please call [phone], to speak with the RN Navigator. Get help right away if your puncture site is bleeding and the bleeding does not stop after applying firm pressure to the area.  You may continue to have skipped beats/ atrial fibrillation during the first several months after your procedure.  It is very important not to miss any doses of your blood thinner Warfarin.    You will follow up with the Afib clinic 4 weeks after your procedure and follow up with the Afib clinic 3 months after your procedure.  Activity restrictions reviewed.  Arlyne verbalized understanding to all instructions provided.

## 2024-08-30 MED FILL — Cefazolin Sodium-Dextrose IV Solution 2 GM/100ML-4%: INTRAVENOUS | Qty: 100 | Status: AC

## 2024-09-02 ENCOUNTER — Encounter: Payer: Self-pay | Admitting: Emergency Medicine

## 2024-09-06 ENCOUNTER — Telehealth: Payer: Self-pay

## 2024-09-06 NOTE — Telephone Encounter (Signed)
 Results reviewed with pt's daughter as per Dr. Vanetta Shawl note.  Daughter verbalized understanding and had no additional questions. Routed to PCP

## 2024-09-23 ENCOUNTER — Ambulatory Visit (HOSPITAL_COMMUNITY): Admitting: Nurse Practitioner

## 2024-09-29 ENCOUNTER — Ambulatory Visit

## 2024-10-19 ENCOUNTER — Ambulatory Visit: Admitting: Cardiology

## 2024-11-24 ENCOUNTER — Ambulatory Visit (HOSPITAL_COMMUNITY): Admitting: Nurse Practitioner

## 2025-08-16 ENCOUNTER — Ambulatory Visit: Admitting: Allergy and Immunology
